# Patient Record
Sex: Female | Born: 1935 | Race: White | Hispanic: No | Smoking: Former smoker
Health system: Southern US, Community
[De-identification: ages and names within clinical notes are randomized; demographics above are authoritative.]

## PROBLEM LIST (undated history)

## (undated) DIAGNOSIS — E785 Hyperlipidemia, unspecified: Secondary | ICD-10-CM

## (undated) DIAGNOSIS — I10 Essential (primary) hypertension: Secondary | ICD-10-CM

## (undated) DIAGNOSIS — Z8619 Personal history of other infectious and parasitic diseases: Secondary | ICD-10-CM

## (undated) DIAGNOSIS — J449 Chronic obstructive pulmonary disease, unspecified: Secondary | ICD-10-CM

## (undated) DIAGNOSIS — IMO0002 Reserved for concepts with insufficient information to code with codable children: Secondary | ICD-10-CM

## (undated) HISTORY — DX: Essential (primary) hypertension: I10

## (undated) HISTORY — DX: Personal history of other infectious and parasitic diseases: Z86.19

## (undated) HISTORY — PX: CHOLECYSTECTOMY: SHX55

## (undated) HISTORY — DX: Reserved for concepts with insufficient information to code with codable children: IMO0002

## (undated) HISTORY — DX: Chronic obstructive pulmonary disease, unspecified: J44.9

## (undated) HISTORY — PX: BREAST BIOPSY: SHX20

## (undated) HISTORY — DX: Hyperlipidemia, unspecified: E78.5

## (undated) HISTORY — PX: APPENDECTOMY: SHX54

---
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
## 1988-08-07 HISTORY — PX: BREAST CYST EXCISION: SHX579

## 1988-08-07 HISTORY — PX: FLEXIBLE SIGMOIDOSCOPY: SHX1649

## 1988-08-07 HISTORY — PX: INCISION AND DRAINAGE BREAST ABSCESS: SUR672

## 1998-08-07 DIAGNOSIS — I10 Essential (primary) hypertension: Secondary | ICD-10-CM | POA: Insufficient documentation

## 1998-08-07 DIAGNOSIS — J449 Chronic obstructive pulmonary disease, unspecified: Secondary | ICD-10-CM | POA: Insufficient documentation

## 2001-08-07 HISTORY — PX: COLONOSCOPY: SHX174

## 2001-08-07 HISTORY — PX: ABDOMINAL HYSTERECTOMY: SHX81

## 2002-02-26 HISTORY — PX: UPPER GI ENDOSCOPY: SHX6162

## 2002-02-26 LAB — HM COLONOSCOPY

## 2003-08-08 HISTORY — PX: BREAST BIOPSY: SHX20

## 2004-05-10 ENCOUNTER — Ambulatory Visit: Payer: Self-pay | Admitting: General Surgery

## 2004-11-09 ENCOUNTER — Ambulatory Visit: Payer: Self-pay | Admitting: General Surgery

## 2005-05-11 ENCOUNTER — Ambulatory Visit: Payer: Self-pay | Admitting: General Surgery

## 2006-05-14 ENCOUNTER — Ambulatory Visit: Payer: Self-pay | Admitting: General Surgery

## 2007-05-15 ENCOUNTER — Ambulatory Visit: Payer: Self-pay | Admitting: General Surgery

## 2007-06-29 DIAGNOSIS — H5440 Blindness, one eye, unspecified eye: Secondary | ICD-10-CM | POA: Insufficient documentation

## 2007-07-06 ENCOUNTER — Emergency Department: Payer: Self-pay | Admitting: Emergency Medicine

## 2007-12-18 DIAGNOSIS — R7303 Prediabetes: Secondary | ICD-10-CM | POA: Insufficient documentation

## 2008-05-19 ENCOUNTER — Ambulatory Visit: Payer: Self-pay | Admitting: General Surgery

## 2009-01-06 ENCOUNTER — Ambulatory Visit: Payer: Self-pay | Admitting: Family Medicine

## 2009-02-01 ENCOUNTER — Ambulatory Visit: Payer: Self-pay | Admitting: Specialist

## 2009-05-24 ENCOUNTER — Ambulatory Visit: Payer: Self-pay | Admitting: General Surgery

## 2009-07-06 DIAGNOSIS — IMO0001 Reserved for inherently not codable concepts without codable children: Secondary | ICD-10-CM | POA: Insufficient documentation

## 2009-11-09 ENCOUNTER — Ambulatory Visit: Payer: Self-pay | Admitting: Specialist

## 2010-04-28 ENCOUNTER — Ambulatory Visit: Payer: Self-pay | Admitting: Specialist

## 2010-05-26 ENCOUNTER — Ambulatory Visit: Payer: Self-pay | Admitting: General Surgery

## 2010-11-03 ENCOUNTER — Ambulatory Visit: Payer: Self-pay | Admitting: Specialist

## 2011-05-29 ENCOUNTER — Ambulatory Visit: Payer: Self-pay | Admitting: General Surgery

## 2011-06-30 ENCOUNTER — Ambulatory Visit: Payer: Self-pay | Admitting: Specialist

## 2012-05-29 ENCOUNTER — Ambulatory Visit: Payer: Self-pay | Admitting: General Surgery

## 2012-12-23 ENCOUNTER — Ambulatory Visit: Payer: Self-pay | Admitting: Family Medicine

## 2013-01-22 ENCOUNTER — Encounter: Payer: Self-pay | Admitting: *Deleted

## 2013-06-10 ENCOUNTER — Ambulatory Visit: Payer: Self-pay | Admitting: General Surgery

## 2013-06-10 ENCOUNTER — Encounter: Payer: Self-pay | Admitting: General Surgery

## 2013-06-26 ENCOUNTER — Ambulatory Visit (INDEPENDENT_AMBULATORY_CARE_PROVIDER_SITE_OTHER): Payer: Medicare Other | Admitting: General Surgery

## 2013-06-26 ENCOUNTER — Encounter: Payer: Self-pay | Admitting: General Surgery

## 2013-06-26 VITALS — BP 154/82 | HR 74 | Resp 16 | Ht 62.0 in | Wt 182.0 lb

## 2013-06-26 DIAGNOSIS — Z1239 Encounter for other screening for malignant neoplasm of breast: Secondary | ICD-10-CM

## 2013-06-26 DIAGNOSIS — N6019 Diffuse cystic mastopathy of unspecified breast: Secondary | ICD-10-CM

## 2013-06-26 NOTE — Progress Notes (Signed)
Patient ID: Toni White, female   DOB: 1936-08-02, 77 y.o.   MRN: [id number]  Chief Complaint  Patient presents with  . Follow-up    mammogram    HPI Toni White is a 77 y.o. female who presents for a breast evaluation. The most recent mammogram was done on 06/10/13. Patient does perform regular self breast checks and gets regular mammograms done.  The patient denies any new problems with the breasts.   HPI  Past Medical History  Diagnosis Date  . Allergy   . Emphysema   . Hypertension   . Osteoporosis   . Personal history of tobacco use, presenting hazards to health   . Asthma   . Ulcer   . Arthritis   . Diffuse cystic mastopathy   . Special screening for malignant neoplasms, colon   . Obesity, unspecified     Past Surgical History  Procedure Laterality Date  . Colonoscopy  2003    Dr. Mechele Collin  . Abdominal hysterectomy    . Flexible sigmoidoscopy  1990  . Cholecystectomy    . Tonsillectomy  1952  . Appendectomy    . Breast biopsy Left 2005    stereo breast biopsy  . Incision and drainage breast abscess Left 1990    Family History  Problem Relation Age of Onset  . Heart disease Father   . Heart disease Mother   . Cancer Brother     lung    Social History History  Substance Use Topics  . Smoking status: Former Smoker -- 20 years  . Smokeless tobacco: Never Used     Comment: quit around 1989  . Alcohol Use: No    Allergies  Allergen Reactions  . Reglan [Metoclopramide] Hives  . Relafen [Nabumetone] Hives  . Sulfa Antibiotics Hives  . Tape Other (See Comments)    Per patient pulls her skin off.  . Hctz [Hydrochlorothiazide] Palpitations    Current Outpatient Prescriptions  Medication Sig Dispense Refill  . aspirin 81 MG tablet Take 81 mg by mouth daily.      . calcium & magnesium carbonates (MYLANTA) 311-232 MG per tablet Take 1 tablet by mouth daily.      . Fluticasone-Salmeterol (ADVAIR) 250-50 MCG/DOSE AEPB Inhale 1 puff into the lungs 2 (two)  times daily.      . metoprolol (LOPRESSOR) 50 MG tablet Take 50 mg by mouth daily.      . Multiple Vitamin (MULTIVITAMIN) capsule Take 1 capsule by mouth daily.      . Multiple Vitamins-Minerals (OCUVITE ADULT 50+ PO) Take by mouth.      . ranitidine (ZANTAC) 150 MG tablet Take 300 mg by mouth at bedtime.      . theophylline (THEODUR) 100 MG 12 hr tablet Take 100 mg by mouth 2 (two) times daily.      Marland Kitchen tiotropium (SPIRIVA) 18 MCG inhalation capsule Place 18 mcg into inhaler and inhale daily.       No current facility-administered medications for this visit.    Review of Systems Review of Systems  Constitutional: Negative.   Respiratory: Negative.   Cardiovascular: Negative.     Blood pressure 154/82, pulse 74, resp. rate 16, height 5\' 2"  (1.575 m), weight 182 lb (82.555 kg).  Physical Exam Physical Exam  Constitutional: She is oriented to person, place, and time. She appears well-developed and well-nourished.  Eyes: Conjunctivae are normal. No scleral icterus.  Neck: Neck supple. No thyromegaly present.  Cardiovascular: Normal rate, regular rhythm and normal  heart sounds.   Pulmonary/Chest: Effort normal and breath sounds normal. Right breast exhibits no inverted nipple, no mass, no nipple discharge, no skin change and no tenderness. Left breast exhibits no inverted nipple, no mass, no nipple discharge, no skin change and no tenderness.  Lymphadenopathy:    She has no cervical adenopathy.    She has no axillary adenopathy.  Neurological: She is alert and oriented to person, place, and time.  Skin: Skin is warm and dry.    Data Reviewed Mammogram reviewed  Assessment    Exam Stable.  History of FCD    Plan    Patient to return in 1 year with a bilateral screening mammogram.        Toni White G 06/27/2013, 5:36 PM

## 2013-06-26 NOTE — Patient Instructions (Addendum)
Patient to continue self breast checks. Patient to contact our office with any new questions or concerns. Patient to return in 1 year.

## 2013-06-27 ENCOUNTER — Encounter: Payer: Self-pay | Admitting: General Surgery

## 2013-08-19 ENCOUNTER — Ambulatory Visit: Payer: Self-pay | Admitting: Family Medicine

## 2013-08-19 ENCOUNTER — Other Ambulatory Visit: Payer: Self-pay | Admitting: Family Medicine

## 2013-08-19 LAB — THEOPHYLLINE LEVEL: Theophylline: 5.6 ug/mL — ABNORMAL LOW (ref 10.0–20.0)

## 2013-08-19 LAB — COMPREHENSIVE METABOLIC PANEL
ANION GAP: 4 — AB (ref 7–16)
Albumin: 3.9 g/dL (ref 3.4–5.0)
Alkaline Phosphatase: 88 U/L
BUN: 12 mg/dL (ref 7–18)
Bilirubin,Total: 0.5 mg/dL (ref 0.2–1.0)
Calcium, Total: 10 mg/dL (ref 8.5–10.1)
Chloride: 101 mmol/L (ref 98–107)
Co2: 30 mmol/L (ref 21–32)
Creatinine: 0.85 mg/dL (ref 0.60–1.30)
EGFR (African American): >60
Glucose: 105 mg/dL — ABNORMAL HIGH (ref 65–99)
Osmolality: 270 (ref 275–301)
POTASSIUM: 3.8 mmol/L (ref 3.5–5.1)
SGOT(AST): 45 U/L — ABNORMAL HIGH (ref 15–37)
SGPT (ALT): 44 U/L (ref 12–78)
Sodium: 135 mmol/L — ABNORMAL LOW (ref 136–145)
TOTAL PROTEIN: 8 g/dL (ref 6.4–8.2)

## 2013-08-19 LAB — TROPONIN I

## 2013-08-19 LAB — URINALYSIS, COMPLETE
BLOOD: NEGATIVE
Bacteria: NONE SEEN
Bilirubin,UR: NEGATIVE
Glucose,UR: NEGATIVE mg/dL (ref 0–75)
Nitrite: NEGATIVE
Ph: 7 (ref 4.5–8.0)
Protein: NEGATIVE
RBC,UR: 1 /HPF (ref 0–5)
SPECIFIC GRAVITY: 1.039 (ref 1.003–1.030)

## 2013-08-19 LAB — CBC WITH DIFFERENTIAL/PLATELET
Basophil #: 0.1 10*3/uL (ref 0.0–0.1)
Basophil %: 1.1 %
Eosinophil #: 0.1 10*3/uL (ref 0.0–0.7)
Eosinophil %: 0.8 %
HCT: 48.4 % — AB (ref 35.0–47.0)
HGB: 16.1 g/dL — AB (ref 12.0–16.0)
Lymphocyte #: 1.3 10*3/uL (ref 1.0–3.6)
Lymphocyte %: 11.9 %
MCH: 28.9 pg (ref 26.0–34.0)
MCHC: 33.2 g/dL (ref 32.0–36.0)
MCV: 87 fL (ref 80–100)
MONO ABS: 1.1 x10 3/mm — AB (ref 0.2–0.9)
Monocyte %: 9.5 %
Neutrophil #: 8.6 10*3/uL — ABNORMAL HIGH (ref 1.4–6.5)
Neutrophil %: 76.7 %
Platelet: 285 10*3/uL (ref 150–440)
RBC: 5.56 10*6/uL — AB (ref 3.80–5.20)
RDW: 13.1 % (ref 11.5–14.5)
WBC: 11.3 10*3/uL — AB (ref 3.6–11.0)

## 2013-08-20 DIAGNOSIS — I725 Aneurysm of other precerebral arteries: Secondary | ICD-10-CM | POA: Insufficient documentation

## 2013-08-20 HISTORY — PX: OTHER SURGICAL HISTORY: SHX169

## 2013-08-21 ENCOUNTER — Other Ambulatory Visit: Payer: Self-pay | Admitting: Family Medicine

## 2013-08-21 LAB — CBC WITH DIFFERENTIAL/PLATELET
BASOS ABS: 0.1 10*3/uL (ref 0.0–0.1)
Basophil %: 0.8 %
Eosinophil #: 0.3 10*3/uL (ref 0.0–0.7)
Eosinophil %: 2.4 %
HCT: 47.3 % — AB (ref 35.0–47.0)
HGB: 16.3 g/dL — ABNORMAL HIGH (ref 12.0–16.0)
LYMPHS ABS: 1 10*3/uL (ref 1.0–3.6)
Lymphocyte %: 8.8 %
MCH: 29.8 pg (ref 26.0–34.0)
MCHC: 34.5 g/dL (ref 32.0–36.0)
MCV: 86 fL (ref 80–100)
MONOS PCT: 9.4 %
Monocyte #: 1.1 x10 3/mm — ABNORMAL HIGH (ref 0.2–0.9)
Neutrophil #: 9.1 10*3/uL — ABNORMAL HIGH (ref 1.4–6.5)
Neutrophil %: 78.6 %
Platelet: 264 10*3/uL (ref 150–440)
RBC: 5.47 10*6/uL — AB (ref 3.80–5.20)
RDW: 13.3 % (ref 11.5–14.5)
WBC: 11.5 10*3/uL — ABNORMAL HIGH (ref 3.6–11.0)

## 2013-08-21 LAB — COMPREHENSIVE METABOLIC PANEL
Albumin: 3.7 g/dL (ref 3.4–5.0)
Alkaline Phosphatase: 86 U/L
Anion Gap: 5 — ABNORMAL LOW (ref 7–16)
BUN: 13 mg/dL (ref 7–18)
Bilirubin,Total: 0.5 mg/dL (ref 0.2–1.0)
CREATININE: 0.74 mg/dL (ref 0.60–1.30)
Calcium, Total: 9.8 mg/dL (ref 8.5–10.1)
Chloride: 101 mmol/L (ref 98–107)
Co2: 30 mmol/L (ref 21–32)
EGFR (African American): >60
EGFR (Non-African Amer.): >60
GLUCOSE: 89 mg/dL (ref 65–99)
Osmolality: 272 (ref 275–301)
Potassium: 4 mmol/L (ref 3.5–5.1)
SGOT(AST): 43 U/L — ABNORMAL HIGH (ref 15–37)
SGPT (ALT): 47 U/L (ref 12–78)
SODIUM: 136 mmol/L (ref 136–145)
Total Protein: 7.6 g/dL (ref 6.4–8.2)

## 2013-08-21 LAB — AMYLASE: Amylase: 31 U/L (ref 25–115)

## 2013-08-21 LAB — MAGNESIUM: Magnesium: 1.6 mg/dL — ABNORMAL LOW

## 2013-08-26 ENCOUNTER — Ambulatory Visit: Payer: Self-pay | Admitting: Family Medicine

## 2013-08-29 ENCOUNTER — Ambulatory Visit: Payer: Self-pay | Admitting: Family Medicine

## 2013-09-02 ENCOUNTER — Ambulatory Visit: Payer: Self-pay | Admitting: Family Medicine

## 2013-10-31 ENCOUNTER — Encounter: Payer: Self-pay | Admitting: Podiatry

## 2013-10-31 ENCOUNTER — Ambulatory Visit (INDEPENDENT_AMBULATORY_CARE_PROVIDER_SITE_OTHER): Payer: Commercial Managed Care - HMO | Admitting: Podiatry

## 2013-10-31 VITALS — BP 130/75 | HR 85 | Resp 16

## 2013-10-31 DIAGNOSIS — M779 Enthesopathy, unspecified: Secondary | ICD-10-CM

## 2013-10-31 DIAGNOSIS — L84 Corns and callosities: Secondary | ICD-10-CM

## 2013-10-31 MED ORDER — TRIAMCINOLONE ACETONIDE 10 MG/ML IJ SUSP
10.0000 mg | Freq: Once | INTRAMUSCULAR | Status: AC
  Administered 2013-10-31: 10 mg

## 2013-10-31 NOTE — Progress Notes (Signed)
Callus on my left foot

## 2013-10-31 NOTE — Progress Notes (Signed)
Subjective:     Patient ID: Toni White, female   DOB: 1935-12-10, 78 y.o.   MRN: [id number]  HPI patient presents stating I'm having a lot of pain on the outside of my right foot that makes wearing shoe gear walking painful. Patient also complains of calluses   Review of Systems     Objective:   Physical Exam Neurovascular status intact with inflammation and pain around fifth metatarsal head right and fluid buildup and keratotic lesion formation    Assessment:     Capsulitis right fifth MPJ with fluid buildup and callus formation of both feet    Plan:     Injected the capsule 3 mg dexamethasone Kenalog and debrided lesions on both feet with no bleeding noted

## 2013-11-13 ENCOUNTER — Encounter: Payer: Self-pay | Admitting: Family Medicine

## 2013-12-05 ENCOUNTER — Encounter: Payer: Self-pay | Admitting: Family Medicine

## 2013-12-05 ENCOUNTER — Ambulatory Visit: Payer: Commercial Managed Care - HMO | Admitting: Podiatry

## 2014-01-26 ENCOUNTER — Inpatient Hospital Stay: Payer: Self-pay | Admitting: Specialist

## 2014-01-26 LAB — URINALYSIS, COMPLETE
BACTERIA: NONE SEEN
Bilirubin,UR: NEGATIVE
Blood: NEGATIVE
GLUCOSE, UR: NEGATIVE mg/dL (ref 0–75)
KETONE: NEGATIVE
LEUKOCYTE ESTERASE: NEGATIVE
Nitrite: NEGATIVE
PROTEIN: NEGATIVE
Ph: 6 (ref 4.5–8.0)
SPECIFIC GRAVITY: 1.017 (ref 1.003–1.030)
WBC UR: 4 /HPF (ref 0–5)

## 2014-01-26 LAB — COMPREHENSIVE METABOLIC PANEL
ALK PHOS: 91 U/L
Albumin: 3.6 g/dL (ref 3.4–5.0)
Anion Gap: 4 — ABNORMAL LOW (ref 7–16)
BILIRUBIN TOTAL: 0.4 mg/dL (ref 0.2–1.0)
BUN: 17 mg/dL (ref 7–18)
CALCIUM: 11.6 mg/dL — AB (ref 8.5–10.1)
CHLORIDE: 99 mmol/L (ref 98–107)
CREATININE: 0.92 mg/dL (ref 0.60–1.30)
Co2: 36 mmol/L — ABNORMAL HIGH (ref 21–32)
GLUCOSE: 122 mg/dL — AB (ref 65–99)
OSMOLALITY: 280 (ref 275–301)
Potassium: 3.8 mmol/L (ref 3.5–5.1)
SGOT(AST): 26 U/L (ref 15–37)
SGPT (ALT): 27 U/L (ref 12–78)
Sodium: 139 mmol/L (ref 136–145)
Total Protein: 8 g/dL (ref 6.4–8.2)

## 2014-01-26 LAB — CBC
HCT: 49 % — ABNORMAL HIGH (ref 35.0–47.0)
HGB: 16.4 g/dL — ABNORMAL HIGH (ref 12.0–16.0)
MCH: 29.3 pg (ref 26.0–34.0)
MCHC: 33.5 g/dL (ref 32.0–36.0)
MCV: 88 fL (ref 80–100)
Platelet: 242 10*3/uL (ref 150–440)
RBC: 5.59 10*6/uL — ABNORMAL HIGH (ref 3.80–5.20)
RDW: 13.1 % (ref 11.5–14.5)
WBC: 11 10*3/uL (ref 3.6–11.0)

## 2014-01-26 LAB — TROPONIN I: Troponin-I: <0.02 ng/mL

## 2014-01-26 LAB — AMMONIA: Ammonia, Plasma: <10 mcmol/L (ref 11–32)

## 2014-01-26 LAB — PRO B NATRIURETIC PEPTIDE: B-Type Natriuretic Peptide: 94 pg/mL (ref 0–450)

## 2014-01-26 LAB — TSH: Thyroid Stimulating Horm: 3.44 u[IU]/mL

## 2014-01-27 LAB — CBC WITH DIFFERENTIAL/PLATELET
BASOS PCT: 0.6 %
Basophil #: 0.1 10*3/uL (ref 0.0–0.1)
EOS PCT: 0 %
Eosinophil #: 0 10*3/uL (ref 0.0–0.7)
HCT: 45.5 % (ref 35.0–47.0)
HGB: 15.2 g/dL (ref 12.0–16.0)
Lymphocyte #: 0.8 10*3/uL — ABNORMAL LOW (ref 1.0–3.6)
Lymphocyte %: 9.1 %
MCH: 29.3 pg (ref 26.0–34.0)
MCHC: 33.3 g/dL (ref 32.0–36.0)
MCV: 88 fL (ref 80–100)
Monocyte #: 0.6 x10 3/mm (ref 0.2–0.9)
Monocyte %: 6.2 %
NEUTROS PCT: 84.1 %
Neutrophil #: 7.8 10*3/uL — ABNORMAL HIGH (ref 1.4–6.5)
PLATELETS: 232 10*3/uL (ref 150–440)
RBC: 5.18 10*6/uL (ref 3.80–5.20)
RDW: 13.6 % (ref 11.5–14.5)
WBC: 9.3 10*3/uL (ref 3.6–11.0)

## 2014-01-27 LAB — BASIC METABOLIC PANEL
ANION GAP: 5 — AB (ref 7–16)
BUN: 21 mg/dL — ABNORMAL HIGH (ref 7–18)
CHLORIDE: 103 mmol/L (ref 98–107)
CO2: 33 mmol/L — AB (ref 21–32)
CREATININE: 0.83 mg/dL (ref 0.60–1.30)
Calcium, Total: 10.5 mg/dL — ABNORMAL HIGH (ref 8.5–10.1)
EGFR (African American): >60
EGFR (Non-African Amer.): >60
GLUCOSE: 139 mg/dL — AB (ref 65–99)
OSMOLALITY: 286 (ref 275–301)
Potassium: 4.3 mmol/L (ref 3.5–5.1)
Sodium: 141 mmol/L (ref 136–145)

## 2014-04-03 ENCOUNTER — Ambulatory Visit: Payer: Self-pay | Admitting: Specialist

## 2014-04-09 ENCOUNTER — Ambulatory Visit: Payer: Self-pay | Admitting: Family Medicine

## 2014-05-19 ENCOUNTER — Ambulatory Visit: Payer: Self-pay | Admitting: Family Medicine

## 2014-05-19 LAB — HM DEXA SCAN

## 2014-06-08 ENCOUNTER — Encounter: Payer: Self-pay | Admitting: Podiatry

## 2014-06-22 DIAGNOSIS — R4182 Altered mental status, unspecified: Secondary | ICD-10-CM | POA: Insufficient documentation

## 2014-06-23 ENCOUNTER — Ambulatory Visit: Payer: Commercial Managed Care - HMO | Admitting: General Surgery

## 2014-06-25 ENCOUNTER — Encounter: Payer: Self-pay | Admitting: General Surgery

## 2014-06-25 ENCOUNTER — Ambulatory Visit: Payer: Self-pay | Admitting: General Surgery

## 2014-07-01 ENCOUNTER — Encounter: Payer: Self-pay | Admitting: General Surgery

## 2014-07-01 ENCOUNTER — Ambulatory Visit (INDEPENDENT_AMBULATORY_CARE_PROVIDER_SITE_OTHER): Payer: Commercial Managed Care - HMO | Admitting: General Surgery

## 2014-07-01 DIAGNOSIS — N6019 Diffuse cystic mastopathy of unspecified breast: Secondary | ICD-10-CM

## 2014-07-01 NOTE — Progress Notes (Signed)
Patient ID: Toni White, female   DOB: 1936/05/22, 78 y.o.   MRN: [id number]  Chief Complaint  Patient presents with  . Follow-up    mammogram    HPI Toni White is a 78 y.o. female. who presents for a breast evaluation. The most recent mammogram was done on 06/12/14. Patient does not perform regular self breast checks but does get regular mammograms done.  No new complaints at this time.    HPI  Past Medical History  Diagnosis Date  . Allergy   . Emphysema   . Hypertension   . Osteoporosis   . Personal history of tobacco use, presenting hazards to health   . Asthma   . Ulcer   . Arthritis   . Diffuse cystic mastopathy   . Special screening for malignant neoplasms, colon   . Obesity, unspecified     Past Surgical History  Procedure Laterality Date  . Colonoscopy  2003    Dr. Vira Agar  . Abdominal hysterectomy    . Flexible sigmoidoscopy  1990  . Cholecystectomy    . Tonsillectomy  1952  . Appendectomy    . Breast biopsy Left 2005    stereo breast biopsy  . Incision and drainage breast abscess Left 1990    Family History  Problem Relation Age of Onset  . Heart disease Father   . Heart disease Mother   . Cancer Brother     lung    Social History History  Substance Use Topics  . Smoking status: Former Smoker -- 20 years  . Smokeless tobacco: Never Used     Comment: quit around 1989  . Alcohol Use: No    Allergies  Allergen Reactions  . Reglan [Metoclopramide] Hives  . Relafen [Nabumetone] Hives  . Sulfa Antibiotics Hives  . Tape Other (See Comments)    Per patient pulls her skin off.  . Hctz [Hydrochlorothiazide] Palpitations    Current Outpatient Prescriptions  Medication Sig Dispense Refill  . amLODipine (NORVASC) 2.5 MG tablet     . aspirin 81 MG tablet Take 81 mg by mouth daily.    . calcium & magnesium carbonates (MYLANTA) 311-232 MG per tablet Take 1 tablet by mouth daily.    . Fluticasone-Salmeterol (ADVAIR) 250-50 MCG/DOSE AEPB Inhale 1  puff into the lungs 2 (two) times daily.    . furosemide (LASIX) 20 MG tablet Take 20 mg by mouth 2 (two) times daily.    . metoprolol succinate (TOPROL-XL) 50 MG 24 hr tablet Take 50 mg by mouth daily.     . Multiple Vitamin (MULTIVITAMIN) capsule Take 1 capsule by mouth daily.    . Multiple Vitamins-Minerals (OCUVITE ADULT 50+ PO) Take by mouth.    . naproxen (NAPROSYN) 500 MG tablet Take 500 mg by mouth 2 (two) times daily with a meal.    . prednisoLONE acetate (PRED FORTE) 1 % ophthalmic suspension Place 1 drop into the right eye 3 (three) times daily.    . ranitidine (ZANTAC) 150 MG tablet Take 300 mg by mouth at bedtime.    . risperiDONE (RISPERDAL) 1 MG tablet     . sertraline (ZOLOFT) 50 MG tablet     . theophylline (THEODUR) 100 MG 12 hr tablet Take 100 mg by mouth 2 (two) times daily.    Marland Kitchen tiotropium (SPIRIVA) 18 MCG inhalation capsule Place 18 mcg into inhaler and inhale daily.     No current facility-administered medications for this visit.    Review of Systems Review  of Systems  Constitutional: Negative.   Respiratory: Negative.   Cardiovascular: Negative.     Blood pressure 120/74, pulse 86, resp. rate 20, height 5\' 2"  (1.575 m), weight 173 lb (78.472 kg).  Physical Exam Physical Exam  Constitutional: She is oriented to person, place, and time. She appears well-developed and well-nourished.  Neck: Neck supple. No thyromegaly present.  Cardiovascular: Normal rate, regular rhythm and normal heart sounds.   No murmur heard. Pulmonary/Chest: Effort normal and breath sounds normal. Right breast exhibits no inverted nipple, no mass, no nipple discharge, no skin change and no tenderness. Left breast exhibits no inverted nipple, no mass, no nipple discharge, no skin change and no tenderness.  Abdominal: Soft. Normal appearance and bowel sounds are normal. There is no hepatosplenomegaly. There is no tenderness. No hernia.  Lymphadenopathy:    She has no cervical adenopathy.     She has no axillary adenopathy.  Neurological: She is alert and oriented to person, place, and time.  Skin: Skin is warm and dry.    Data Reviewed Mammogram reviewed and stable.   Assessment    Stable exam. FCD by multiple prior biopsies    Plan    Patient to return in 1 year bilateral screening mammogram.        Daxon Kyne G 07/01/2014, 9:35 AM

## 2014-07-01 NOTE — Patient Instructions (Signed)
Patient to return in 1 year with a bilateral screening mammogram. Continue self breast exams. Call office for any new breast issues or concerns.

## 2014-08-18 DIAGNOSIS — R5382 Chronic fatigue, unspecified: Secondary | ICD-10-CM | POA: Diagnosis not present

## 2014-08-18 DIAGNOSIS — B0052 Herpesviral keratitis: Secondary | ICD-10-CM | POA: Diagnosis not present

## 2014-08-18 DIAGNOSIS — I671 Cerebral aneurysm, nonruptured: Secondary | ICD-10-CM | POA: Diagnosis not present

## 2014-08-18 DIAGNOSIS — R4182 Altered mental status, unspecified: Secondary | ICD-10-CM | POA: Diagnosis not present

## 2014-08-22 DIAGNOSIS — J449 Chronic obstructive pulmonary disease, unspecified: Secondary | ICD-10-CM | POA: Diagnosis not present

## 2014-08-31 DIAGNOSIS — R0602 Shortness of breath: Secondary | ICD-10-CM | POA: Diagnosis not present

## 2014-08-31 DIAGNOSIS — J449 Chronic obstructive pulmonary disease, unspecified: Secondary | ICD-10-CM | POA: Diagnosis not present

## 2014-09-22 DIAGNOSIS — J449 Chronic obstructive pulmonary disease, unspecified: Secondary | ICD-10-CM | POA: Diagnosis not present

## 2014-10-01 DIAGNOSIS — J449 Chronic obstructive pulmonary disease, unspecified: Secondary | ICD-10-CM | POA: Diagnosis not present

## 2014-10-01 DIAGNOSIS — R0602 Shortness of breath: Secondary | ICD-10-CM | POA: Diagnosis not present

## 2014-10-21 DIAGNOSIS — J449 Chronic obstructive pulmonary disease, unspecified: Secondary | ICD-10-CM | POA: Diagnosis not present

## 2014-10-22 DIAGNOSIS — B0052 Herpesviral keratitis: Secondary | ICD-10-CM | POA: Diagnosis not present

## 2014-10-30 DIAGNOSIS — J449 Chronic obstructive pulmonary disease, unspecified: Secondary | ICD-10-CM | POA: Diagnosis not present

## 2014-10-30 DIAGNOSIS — R0602 Shortness of breath: Secondary | ICD-10-CM | POA: Diagnosis not present

## 2014-11-09 DIAGNOSIS — R5382 Chronic fatigue, unspecified: Secondary | ICD-10-CM | POA: Diagnosis not present

## 2014-11-09 DIAGNOSIS — J449 Chronic obstructive pulmonary disease, unspecified: Secondary | ICD-10-CM | POA: Diagnosis not present

## 2014-11-09 DIAGNOSIS — I671 Cerebral aneurysm, nonruptured: Secondary | ICD-10-CM | POA: Diagnosis not present

## 2014-11-09 DIAGNOSIS — E782 Mixed hyperlipidemia: Secondary | ICD-10-CM | POA: Diagnosis not present

## 2014-11-10 DIAGNOSIS — R0902 Hypoxemia: Secondary | ICD-10-CM | POA: Diagnosis not present

## 2014-11-10 DIAGNOSIS — R0609 Other forms of dyspnea: Secondary | ICD-10-CM | POA: Diagnosis not present

## 2014-11-10 DIAGNOSIS — J432 Centrilobular emphysema: Secondary | ICD-10-CM | POA: Diagnosis not present

## 2014-11-21 DIAGNOSIS — J449 Chronic obstructive pulmonary disease, unspecified: Secondary | ICD-10-CM | POA: Diagnosis not present

## 2014-11-28 NOTE — Discharge Summary (Signed)
PATIENT NAME:  Toni White, RAUSCH MR#:  [id number] DATE OF BIRTH:  Jun 14, 1936  DATE OF ADMISSION:  01/26/2014 DATE OF DISCHARGE:  01/29/2014  For a detailed note, please take a look at the history and physical done on admission by Dr. Tressia Miners.   DIAGNOSES AT DISCHARGE: As follows:  1. Chronic obstructive pulmonary disease exacerbation.  2. Metabolic encephalopathy secondary to hypoxia and hypercarbia from chronic obstructive pulmonary disease.  3. Hypertension.  4. Hyperlipidemia.  5. Depression.   DIET: The patient is being discharged on a low-sodium, low-fat diet.   ACTIVITY: As tolerated.   FOLLOWUP:  Dr. Lelon Huh in next 1-2 weeks. Patient is being discharged home with home health physical therapy and nursing services.   DISCHARGE MEDICATIONS: Advair 250/50 one puff b.i.d., Spiriva 1 puff daily, multivitamin daily, calcium carbonate 2 tabs daily, Ocuvite 2 tablets daily, aspirin 81 mg daily, theophylline 100 mg b.i.d., Toprol 50 mg daily, ranitidine 300 mg at bedtime, albuterol inhaler 2 puffs q. 4-6 hours as needed, Lasix 20 mg b.i.d. as needed, Zoloft 100 mg daily, amlodipine 5 mg daily, Naprosyn 5 mg b.i.d. as needed, Risperdal 1 mg daily, Pravachol 40 mg at bedtime, prednisone taper starting at 60 mg down to 10 mg over the next 6 days.   [place] COURSE: None.   PERTINENT STUDIES DONE DURING THE HOSPITAL COURSE: CT scan of the head done without contrast on admission showing no acute intracranial process.  A chest x-ray done on admission showing no acute cardiopulmonary disease.   BRIEF HOSPITAL COURSE: This is a 79 year old female with medical problems as mentioned above, presented to the hospital due to shortness of breath, wheezing and cough, and noted to be somnolent.  1. Chronic obstructive pulmonary disease exacerbation. This is likely the cause of the patient's shortness of breath, weakness, and lethargy. The patient was treated with IV steroids,  around-the-clock nebulizer treatments. Maintained on Advair, Spiriva, and theophylline. The patient's chest x-ray did not show any evidence of acute pneumonia or bronchitis; therefore, she was not started on antibiotics. She was also maintained on BiPAP at nights and given O2 supplementation. Over the past 48 to 72 hours, the patient's clinical symptoms have significantly improved. She is less bronchospastic; therefore, she is being discharged on an oral prednisone taper along with the maintenance of her inhalers. At this point, she was only using nocturnal oxygen, but she is being advised to use oxygen continuously throughout the day, too.  2. Hypertension. The patient remained hemodynamically stable. She was maintained on her metoprolol and Norvasc. She will continue that.  3. Weakness and sleepiness. The patient apparently had been sleeping quite a bit during the days at home. There was some concern for possible underlying sleep apnea; therefore, the patient was maintained on BiPAP at nights. I think she needs an official sleep study as outpatient. A lot of this probably is secondary to her COPD with hypoxia and hypercarbia, which has improved. The patient was also seen by physical therapy and they thought she would benefit from home health services; therefore, home health PT and nursing was arranged for her.  4. Hypercalcemia. This was likely secondary to dehydration improved with IV fluids.  5. Hyperlipidemia. The patient was maintained on her Pravachol. She will resume that.  6. Depression. The patient was maintained on her Zoloft and she will also resume that upon discharge.   CODE STATUS: The patient is a full code.   TIME SPENT ON DISCHARGE: 40 minutes.  ____________________________ Belia Heman. Verdell Carmine, MD vjs:dd D: 01/29/2014 15:26:12 ET T: 01/29/2014 19:36:24 ET JOB#: [id number]  cc: Belia Heman. Verdell Carmine, MD, <Dictator> Kirstie Peri. Caryn Section, MD Henreitta Leber MD ELECTRONICALLY SIGNED 02/03/2014  20:41

## 2014-11-28 NOTE — H&P (Signed)
PATIENT NAME:  Toni White, Toni White MR#:  [id number] DATE OF BIRTH:  May 21, 1936  DATE OF ADMISSION:  01/26/2014  ADMITTING PHYSICIAN: Gladstone Lighter, MD   PRIMARY CARE PHYSICIAN: Kirstie Peri. Caryn Section, MD   CHIEF COMPLAINT: Sleeping and difficulty breathing.   HISTORY OF PRESENT ILLNESS: Ms. Petersen is a 79 year old Caucasian female with past medical history significant for hypertension, hyperlipidemia, depression and chronic obstructive pulmonary disease, only on nocturnal home oxygen, was brought in today by family secondary to worsening weakness and also shortness of breath for almost 4 days now. The patient's daughter explained that the patient lives at home with a roommate, usually very active at baseline, able to care for herself, however, for the past few months that she has been very sleepy, sleeping almost up to 16 to 20 hours a day recently.  This sleeping and generalized weakness has been worsening in the last month. For the last 4 days, the patient has had audible wheezing,  worsening dyspnea, more sleeping, occasional dry cough so was brought into the hospital today.  ABG shows pH of 7.35, pCO2 of 66 at this time. The patient is alert, arousable and is oriented. She has significant wheezing on exam and is being admitted for chronic obstructive pulmonary disease exacerbation. The patient also was hypoxic, 87% on room air when she arrived, and placed on 2 liters nasal cannula with sats about 90% to 91% at this time. She is on Seroquel and Risperdal at home according to daughter. These have been started several years ago, no recent dose changes.   PAST MEDICAL HISTORY: 1.  Hypertension.  2.  Hyperlipidemia.  3.  History of transient ischemic attacks.  4.  Borderline diabetes mellitus.  6.  Chronic obstructive pulmonary disease with emphysema, on nocturnal 1 liter home oxygen.  7.  Depression.   PAST SURGICAL HISTORY: 1.  Tonsillectomy.  2.  Cholecystectomy.  3.  Hysterectomy.   ALLERGIES:  RELAFEN, SUFLA, TAPE.   CURRENT HOME MEDICATIONS:  1.  Aspirin 81 mg p.o. daily.  2.  Advair 250/50 one puff b.i.d.  3.  Norvasc 5 mg p.o. daily.  4.  Calcium carbonate 2 tablets p.o. daily.  5.  Lasix 20 mg p.o. b.i.d. p.r.n.  6.  Toprol 50 mg p.o. daily.  7.  Multivitamin 1 tablet daily.  8.  Naproxen 500 mg p.o. b.i.d.  9.  Ocuvite multivitamin and minerals capsule 2 capsules daily.  10.  Pravachol 40 mg p.o. daily.  11.  Ranitidine 300 mg p.o. at bedtime.  12.  Risperdal 1 mg p.o. daily.  13.  Sertraline 100 mg p.o. daily.  14.  Spiriva 18 mcg inhalation capsule daily.  15.  Theophylline 100 mg p.o. b.i.d.  16.  Ventolin inhaler 2 puffs q.4 to 6 hours p.r.n.   SOCIAL HISTORY: The patient lives at home with a roommate. Quit smoking more than 25 years ago. No alcohol use.   FAMILY HISTORY: Significant for hypertension in the family.    REVIEW OF SYSTEMS:    CONSTITUTIONAL: Positive for fatigue and weakness. No fevers.  EYES: Positive for visual loss in the right eye several years ago and also had recent herpes virus on the same side. No glaucoma or cataracts.  ENT: No tinnitus, ear pain, hearing loss, epistaxis or discharge.  RESPIRATORY: Positive for cough and wheezing and history of chronic obstructive pulmonary disease. Positive for dyspnea. No hemoptysis.  CARDIOVASCULAR: No chest pain, orthopnea, edema, palpitations or syncope. Had near syncope yesterday.  GASTROINTESTINAL: No  nausea, vomiting, abdominal pain, diarrhea, hematemesis or melena.  GENITOURINARY: No dysuria, hematuria, renal calculus, frequency or incontinence.  ENDOCRINE: No polyuria, nocturia, thyroid problems, heat or cold intolerance.  HEMATOLOGY: No anemia, easy bruising or bleeding. SKIN: no acne, rash or lesions.  MUSCULOSKELETAL: Positive for osteoarthritis. No gout.  NEUROLOGICAL:  No numbness, weakness, cerebrovascular accident or seizures. Positive history of transient ischemic attacks with no  residual neurological deficits.  PSYCHOLOGIC: No anxiety, insomnia or active depression at this time.   PHYSICAL EXAMINATION: VITAL SIGNS: Temperature 98 degrees Fahrenheit, pulse 96, respirations 20, blood pressure 164/84, pulse oximetry 87% on room air.  GENERAL: Well-built, well-nourished female lying in bed, not in any acute distress.  HEENT: Normocephalic, atraumatic. Left eye pupil is normal size, reacting to light, normal extraocular movements. Right eye is smaller in size with erythematous conjunctiva and tearing.  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  Oropharynx clear without erythema, mass or exudates.  NECK: Supple. No thyromegaly, JVD or carotid bruits. No lymphadenopathy.  LUNGS: Moving air bilaterally. Diffuse tight expiratory wheeze throughout the lung fields. No crackles or rhonchi heard. No use of accessory muscles for breathing.  CARDIOVASCULAR: S1, S2, regular rate and rhythm. No murmurs, rubs or gallops.  ABDOMEN: Soft, nontender, nondistended. No hepatosplenomegaly. Normal bowel sounds.  EXTREMITIES: No pedal edema. No clubbing or cyanosis. Feet are cold to touch at this time.  No purplish discoloration, feeble dorsalis pedis pulses are palpable.  SKIN: No acne, rash or lesions.  LYMPHATICS: No cervical lymphadenopathy.  NEUROLOGIC: Other than the right, cranial nerves appear to be intact.  No focal motor or sensory deficits.  PSYCHOLOGICAL: The patient alert, oriented x 3 at the time of my exam.   LABORATORY, DIAGNOSTIC AND RADIOLOGICAL DATA:    1.  WBC 11.0, hemoglobin 16.4, hematocrit 49.0, platelet count 242.  2.  Sodium 139, potassium 3.8, chloride 99, bicarbonate 36, BUN 17, creatinine 0.92, glucose of 122 and calcium of 11.6.  3.  ALT 27, AST 26, alkaline phosphatase 91, total bilirubin 0.4, albumin of 3.6.  4.  BNP is 94. Troponin is negative.  5.  Urinalysis negative for infection.  6.  Chest x-ray clear lung fields.  7.  ABG showing pH of 7.35, pCO2  66, CO2 96, bicarbonate 36.4 and oxygen saturation of 97% on 4 liters nasal cannula.  8.  CT of the head without contact showing atrophy, small vessel ischemic changes, no acute intracranial abnormality.  9.  EKG showing normal sinus rhythm, right bundle-branch block, heart rate of 90.   ASSESSMENT AND PLAN: A 79 year old female with history of chronic obstructive pulmonary disease, hypertension, history of transient ischemic attacks, brought in by family due to wheezing, cough and sleeping several hours a day. 1.  Acute on chronic obstructive pulmonary disease exacerbation, hypoxic to room air, now requiring 2 liters continuous O2. Solu-Medrol IV, nebulizers around-the-clock, inhalers and monitor.  No evidence of pneumonia or bronchitis so hold off on antibiotics at this time. ABG showing minimal acidosis, will try BiPAP at night here while in the hospital. Hopefully, that should help with her sleeping and weakness.  2.  Hypertension. Continue home medications.  3.  History of transient ischemic attack, stable on aspirin and statin.  4.  Hypercalcemia.  Could be dehydration and hematocrit also elevated. Do gentle fluids. Check a parathyroid hormone and TSH levels at this time.  5.  Increased weakness and sleepiness. Hold any sedating medications. Hold her Seroquel and Risperdal. Physical therapy consult.  6.  Deep vein thrombosis prophylaxis with Lovenox.  7.  CODE STATUS: Full code.   TIME SPENT ON ADMISSION: 50 minutes.   ____________________________ Gladstone Lighter, MD rk:cs D: 01/26/2014 14:39:00 ET T: 01/26/2014 15:35:45 ET JOB#: [id number]  cc: Gladstone Lighter, MD, <Dictator> Kirstie Peri. Caryn Section, MD Gladstone Lighter MD ELECTRONICALLY SIGNED 01/26/2014 16:42

## 2014-11-30 DIAGNOSIS — J449 Chronic obstructive pulmonary disease, unspecified: Secondary | ICD-10-CM | POA: Diagnosis not present

## 2014-11-30 DIAGNOSIS — R0602 Shortness of breath: Secondary | ICD-10-CM | POA: Diagnosis not present

## 2014-12-09 DIAGNOSIS — F3341 Major depressive disorder, recurrent, in partial remission: Secondary | ICD-10-CM | POA: Diagnosis not present

## 2014-12-09 DIAGNOSIS — J449 Chronic obstructive pulmonary disease, unspecified: Secondary | ICD-10-CM | POA: Diagnosis not present

## 2014-12-09 DIAGNOSIS — I10 Essential (primary) hypertension: Secondary | ICD-10-CM | POA: Diagnosis not present

## 2014-12-09 DIAGNOSIS — E119 Type 2 diabetes mellitus without complications: Secondary | ICD-10-CM | POA: Diagnosis not present

## 2014-12-09 DIAGNOSIS — F331 Major depressive disorder, recurrent, moderate: Secondary | ICD-10-CM | POA: Diagnosis not present

## 2014-12-09 DIAGNOSIS — E782 Mixed hyperlipidemia: Secondary | ICD-10-CM | POA: Diagnosis not present

## 2014-12-09 LAB — BASIC METABOLIC PANEL
BUN: 17 mg/dL (ref 4–21)
Creatinine: 0.9 mg/dL (ref 0.5–1.1)
Glucose: 99 mg/dL
Potassium: 4.4 mmol/L (ref 3.4–5.3)
SODIUM: 144 mmol/L (ref 137–147)

## 2014-12-09 LAB — HEMOGLOBIN A1C: HEMOGLOBIN A1C: 5.9 % (ref 4.0–6.0)

## 2014-12-09 LAB — LIPID PANEL
CHOLESTEROL: 148 mg/dL (ref 0–200)
HDL: 42 mg/dL (ref 35–70)
LDL CALC: 69 mg/dL
TRIGLYCERIDES: 184 mg/dL — AB (ref 40–160)

## 2014-12-21 DIAGNOSIS — J449 Chronic obstructive pulmonary disease, unspecified: Secondary | ICD-10-CM | POA: Diagnosis not present

## 2014-12-30 DIAGNOSIS — R0602 Shortness of breath: Secondary | ICD-10-CM | POA: Diagnosis not present

## 2014-12-30 DIAGNOSIS — J449 Chronic obstructive pulmonary disease, unspecified: Secondary | ICD-10-CM | POA: Diagnosis not present

## 2015-01-21 DIAGNOSIS — J449 Chronic obstructive pulmonary disease, unspecified: Secondary | ICD-10-CM | POA: Diagnosis not present

## 2015-01-30 DIAGNOSIS — R0602 Shortness of breath: Secondary | ICD-10-CM | POA: Diagnosis not present

## 2015-01-30 DIAGNOSIS — J449 Chronic obstructive pulmonary disease, unspecified: Secondary | ICD-10-CM | POA: Diagnosis not present

## 2015-02-09 DIAGNOSIS — Z85828 Personal history of other malignant neoplasm of skin: Secondary | ICD-10-CM | POA: Diagnosis not present

## 2015-02-09 DIAGNOSIS — L988 Other specified disorders of the skin and subcutaneous tissue: Secondary | ICD-10-CM | POA: Diagnosis not present

## 2015-02-09 DIAGNOSIS — L821 Other seborrheic keratosis: Secondary | ICD-10-CM | POA: Diagnosis not present

## 2015-02-18 DIAGNOSIS — G4711 Idiopathic hypersomnia with long sleep time: Secondary | ICD-10-CM | POA: Diagnosis not present

## 2015-02-18 DIAGNOSIS — R4182 Altered mental status, unspecified: Secondary | ICD-10-CM | POA: Diagnosis not present

## 2015-02-18 DIAGNOSIS — R5382 Chronic fatigue, unspecified: Secondary | ICD-10-CM | POA: Diagnosis not present

## 2015-02-18 DIAGNOSIS — I671 Cerebral aneurysm, nonruptured: Secondary | ICD-10-CM | POA: Diagnosis not present

## 2015-02-20 DIAGNOSIS — J449 Chronic obstructive pulmonary disease, unspecified: Secondary | ICD-10-CM | POA: Diagnosis not present

## 2015-02-23 DIAGNOSIS — E119 Type 2 diabetes mellitus without complications: Secondary | ICD-10-CM | POA: Diagnosis not present

## 2015-02-23 DIAGNOSIS — Z9981 Dependence on supplemental oxygen: Secondary | ICD-10-CM | POA: Diagnosis not present

## 2015-02-23 DIAGNOSIS — R5382 Chronic fatigue, unspecified: Secondary | ICD-10-CM | POA: Diagnosis not present

## 2015-02-23 DIAGNOSIS — I10 Essential (primary) hypertension: Secondary | ICD-10-CM | POA: Diagnosis not present

## 2015-02-23 DIAGNOSIS — Z9181 History of falling: Secondary | ICD-10-CM | POA: Diagnosis not present

## 2015-02-23 DIAGNOSIS — M810 Age-related osteoporosis without current pathological fracture: Secondary | ICD-10-CM | POA: Diagnosis not present

## 2015-02-23 DIAGNOSIS — J449 Chronic obstructive pulmonary disease, unspecified: Secondary | ICD-10-CM | POA: Diagnosis not present

## 2015-02-23 DIAGNOSIS — M791 Myalgia: Secondary | ICD-10-CM | POA: Diagnosis not present

## 2015-02-23 DIAGNOSIS — Z87891 Personal history of nicotine dependence: Secondary | ICD-10-CM | POA: Diagnosis not present

## 2015-02-24 DIAGNOSIS — H40032 Anatomical narrow angle, left eye: Secondary | ICD-10-CM | POA: Diagnosis not present

## 2015-02-25 ENCOUNTER — Ambulatory Visit: Payer: Commercial Managed Care - HMO | Attending: Specialist

## 2015-02-25 ENCOUNTER — Other Ambulatory Visit: Payer: Self-pay | Admitting: Specialist

## 2015-02-25 DIAGNOSIS — G4710 Hypersomnia, unspecified: Secondary | ICD-10-CM | POA: Diagnosis not present

## 2015-02-25 DIAGNOSIS — R0609 Other forms of dyspnea: Secondary | ICD-10-CM | POA: Diagnosis not present

## 2015-02-25 DIAGNOSIS — R0902 Hypoxemia: Secondary | ICD-10-CM | POA: Diagnosis not present

## 2015-02-25 DIAGNOSIS — J449 Chronic obstructive pulmonary disease, unspecified: Secondary | ICD-10-CM | POA: Diagnosis not present

## 2015-02-25 LAB — BLOOD GAS, ARTERIAL
Acid-Base Excess: 9.9 mmol/L — ABNORMAL HIGH (ref 0.0–3.0)
Allens test (pass/fail): POSITIVE — AB
Bicarbonate: 36 mEq/L — ABNORMAL HIGH (ref 21.0–28.0)
FIO2: 0.28 %
O2 Saturation: 96.7 %
Patient temperature: 37
pCO2 arterial: 53 mmHg — ABNORMAL HIGH (ref 32.0–48.0)
pH, Arterial: 7.44 (ref 7.350–7.450)
pO2, Arterial: 84 mmHg (ref 83.0–108.0)

## 2015-02-26 ENCOUNTER — Encounter: Payer: Self-pay | Admitting: Family Medicine

## 2015-03-01 DIAGNOSIS — J449 Chronic obstructive pulmonary disease, unspecified: Secondary | ICD-10-CM | POA: Diagnosis not present

## 2015-03-01 DIAGNOSIS — R0602 Shortness of breath: Secondary | ICD-10-CM | POA: Diagnosis not present

## 2015-03-05 ENCOUNTER — Telehealth: Payer: Self-pay | Admitting: *Deleted

## 2015-03-05 NOTE — Telephone Encounter (Signed)
[facility]. Patient will have PT for strength, use of walker and wheelchair.

## 2015-03-08 DIAGNOSIS — J449 Chronic obstructive pulmonary disease, unspecified: Secondary | ICD-10-CM | POA: Diagnosis not present

## 2015-03-08 DIAGNOSIS — E139 Other specified diabetes mellitus without complications: Secondary | ICD-10-CM | POA: Diagnosis not present

## 2015-03-08 DIAGNOSIS — I729 Aneurysm of unspecified site: Secondary | ICD-10-CM | POA: Diagnosis not present

## 2015-03-08 DIAGNOSIS — R0902 Hypoxemia: Secondary | ICD-10-CM | POA: Diagnosis not present

## 2015-03-17 DIAGNOSIS — R5382 Chronic fatigue, unspecified: Secondary | ICD-10-CM | POA: Diagnosis not present

## 2015-03-17 DIAGNOSIS — I671 Cerebral aneurysm, nonruptured: Secondary | ICD-10-CM | POA: Diagnosis not present

## 2015-03-17 DIAGNOSIS — R251 Tremor, unspecified: Secondary | ICD-10-CM | POA: Diagnosis not present

## 2015-03-17 DIAGNOSIS — G4711 Idiopathic hypersomnia with long sleep time: Secondary | ICD-10-CM | POA: Diagnosis not present

## 2015-03-17 DIAGNOSIS — R4182 Altered mental status, unspecified: Secondary | ICD-10-CM | POA: Diagnosis not present

## 2015-03-30 DIAGNOSIS — H40003 Preglaucoma, unspecified, bilateral: Secondary | ICD-10-CM | POA: Diagnosis not present

## 2015-04-22 ENCOUNTER — Other Ambulatory Visit: Payer: Self-pay | Admitting: Family Medicine

## 2015-04-29 ENCOUNTER — Other Ambulatory Visit: Payer: Self-pay | Admitting: Family Medicine

## 2015-04-29 ENCOUNTER — Telehealth: Payer: Self-pay

## 2015-04-29 NOTE — Telephone Encounter (Signed)
Lattie Haw from [facility] mail order pharmacy called stating the patient contacted them requesting a refill on Sertraline 100mg . The orginial rx for Sertraline was prescribed by Dr. Caryn Section 06/2014. Lattie Haw states that patient has not had this medication sent out to her in several months (last filled 09/2014). Patient has been getting Citalopram 20mg  sent out to her that was prescribed by Dr. Melrose Nakayama. Lattie Haw needs to verify if patient is supposed to be on both medications. Please advised. Call back number is [phone] ref # [id number].

## 2015-04-29 NOTE — Telephone Encounter (Signed)
Called and spoke with patient and advised her as below. Patient states she has been taking the Citalopram that is prescribed by Dr. Melrose Nakayama. Patient states Dr. Melrose Nakayama took her off the Sertraline several months ago and changed her to Citalopram. I advised her that [place] called [place] because she requested a refill on the Sertraline. Patient states she accidentally put a request in by mistake. Patient is aware that she should not be taking both medications. I called [place] and advised them that patient is only to be on Citalopram and not Sertraline.

## 2015-04-29 NOTE — Telephone Encounter (Signed)
Should only be on one or the other. Recommend she continue citalopram for the time being.

## 2015-04-30 ENCOUNTER — Other Ambulatory Visit: Payer: Self-pay

## 2015-04-30 DIAGNOSIS — Z1231 Encounter for screening mammogram for malignant neoplasm of breast: Secondary | ICD-10-CM

## 2015-05-11 DIAGNOSIS — Z79899 Other long term (current) drug therapy: Secondary | ICD-10-CM | POA: Diagnosis not present

## 2015-05-11 DIAGNOSIS — E782 Mixed hyperlipidemia: Secondary | ICD-10-CM | POA: Diagnosis not present

## 2015-05-11 DIAGNOSIS — I671 Cerebral aneurysm, nonruptured: Secondary | ICD-10-CM | POA: Diagnosis not present

## 2015-05-11 DIAGNOSIS — J449 Chronic obstructive pulmonary disease, unspecified: Secondary | ICD-10-CM | POA: Diagnosis not present

## 2015-05-15 ENCOUNTER — Other Ambulatory Visit: Payer: Self-pay | Admitting: Family Medicine

## 2015-06-01 DIAGNOSIS — R5382 Chronic fatigue, unspecified: Secondary | ICD-10-CM | POA: Diagnosis not present

## 2015-06-01 DIAGNOSIS — Z23 Encounter for immunization: Secondary | ICD-10-CM | POA: Diagnosis not present

## 2015-06-01 DIAGNOSIS — J432 Centrilobular emphysema: Secondary | ICD-10-CM | POA: Diagnosis not present

## 2015-06-01 DIAGNOSIS — R0902 Hypoxemia: Secondary | ICD-10-CM | POA: Diagnosis not present

## 2015-06-01 DIAGNOSIS — R0609 Other forms of dyspnea: Secondary | ICD-10-CM | POA: Diagnosis not present

## 2015-06-05 ENCOUNTER — Other Ambulatory Visit: Payer: Self-pay | Admitting: Family Medicine

## 2015-06-08 DIAGNOSIS — R600 Localized edema: Secondary | ICD-10-CM | POA: Insufficient documentation

## 2015-06-08 DIAGNOSIS — M810 Age-related osteoporosis without current pathological fracture: Secondary | ICD-10-CM | POA: Insufficient documentation

## 2015-06-08 DIAGNOSIS — G4710 Hypersomnia, unspecified: Secondary | ICD-10-CM | POA: Insufficient documentation

## 2015-06-08 DIAGNOSIS — M545 Low back pain, unspecified: Secondary | ICD-10-CM | POA: Insufficient documentation

## 2015-06-08 DIAGNOSIS — R000 Tachycardia, unspecified: Secondary | ICD-10-CM | POA: Insufficient documentation

## 2015-06-08 DIAGNOSIS — H3530 Unspecified macular degeneration: Secondary | ICD-10-CM | POA: Insufficient documentation

## 2015-06-08 DIAGNOSIS — F3341 Major depressive disorder, recurrent, in partial remission: Secondary | ICD-10-CM | POA: Insufficient documentation

## 2015-06-08 DIAGNOSIS — R0989 Other specified symptoms and signs involving the circulatory and respiratory systems: Secondary | ICD-10-CM | POA: Insufficient documentation

## 2015-06-08 DIAGNOSIS — M5430 Sciatica, unspecified side: Secondary | ICD-10-CM | POA: Insufficient documentation

## 2015-06-09 ENCOUNTER — Encounter: Payer: Self-pay | Admitting: Family Medicine

## 2015-06-09 ENCOUNTER — Ambulatory Visit (INDEPENDENT_AMBULATORY_CARE_PROVIDER_SITE_OTHER): Payer: Commercial Managed Care - HMO | Admitting: Family Medicine

## 2015-06-09 VITALS — BP 120/68 | HR 72 | Temp 98.5°F | Resp 16 | Wt 162.0 lb

## 2015-06-09 DIAGNOSIS — R600 Localized edema: Secondary | ICD-10-CM

## 2015-06-09 DIAGNOSIS — R609 Edema, unspecified: Secondary | ICD-10-CM | POA: Diagnosis not present

## 2015-06-09 DIAGNOSIS — R251 Tremor, unspecified: Secondary | ICD-10-CM

## 2015-06-09 DIAGNOSIS — R0989 Other specified symptoms and signs involving the circulatory and respiratory systems: Secondary | ICD-10-CM

## 2015-06-09 DIAGNOSIS — F3341 Major depressive disorder, recurrent, in partial remission: Secondary | ICD-10-CM | POA: Diagnosis not present

## 2015-06-09 DIAGNOSIS — E785 Hyperlipidemia, unspecified: Secondary | ICD-10-CM

## 2015-06-09 DIAGNOSIS — E119 Type 2 diabetes mellitus without complications: Secondary | ICD-10-CM | POA: Diagnosis not present

## 2015-06-09 DIAGNOSIS — G4710 Hypersomnia, unspecified: Secondary | ICD-10-CM | POA: Diagnosis not present

## 2015-06-09 DIAGNOSIS — I10 Essential (primary) hypertension: Secondary | ICD-10-CM

## 2015-06-09 LAB — POCT GLYCOSYLATED HEMOGLOBIN (HGB A1C)
Est. average glucose Bld gHb Est-mCnc: 111
Hemoglobin A1C: 5.5

## 2015-06-09 NOTE — Progress Notes (Signed)
Patient: Toni White Female    DOB: 1935/09/26   79 y.o.   MRN: [id number] Visit Date: 06/09/2015  Today's Provider: Lelon Huh, MD   Chief Complaint  Patient presents with  . Hypertension    follow up  . Diabetes    follow up  . Hyperlipidemia    follow up   Subjective:    HPI   Diabetes Mellitus Type II, Follow-up:   Lab Results  Component Value Date   HGBA1C 5.9 12/09/2014   Last seen for diabetes 6 months ago.  Management since then includes no changes. She reports good compliance with treatment. She is not having side effects.  Current symptoms include none and have been stable. Home blood sugar records: not being checked  Episodes of hypoglycemia? no   Current Insulin Regimen: none Most Recent Eye Exam: <1 year Weight trend: stable Prior visit with dietician: no Current diet: in general, an "unhealthy" diet Current exercise: none  ------------------------------------------------------------------------   Hypertension, follow-up:  BP Readings from Last 3 Encounters:  06/09/15 120/68  07/01/14 120/74  10/31/13 130/75    She was last seen for hypertension 6 months ago.  BP at that visit was 114/70. Management since that visit includes no changes.She reports good compliance with treatment. She is not having side effects.  She is not exercising. She is not adherent to low salt diet.   Outside blood pressures are being checked once a week by Hospice nurse. She is experiencing occasional swelling in legs  Patient denies chest pain, chest pressure/discomfort, claudication, dyspnea, exertional chest pressure/discomfort, fatigue and irregular heart beat.   Cardiovascular risk factors include advanced age (older than 3 for men, 24 for women), diabetes mellitus, dyslipidemia, hypertension and sedentary lifestyle.  Use of agents associated with hypertension: NSAIDS.    ------------------------------------------------------------------------    Lipid/Cholesterol, Follow-up:   Last seen for this 6 months ago.  Management since that visit includes no changes.  Last Lipid Panel:    Component Value Date/Time   CHOL 148 12/09/2014   TRIG 184* 12/09/2014   HDL 42 12/09/2014   LDLCALC 69 12/09/2014    She reports good compliance with treatment. She is not having side effects.   Wt Readings from Last 3 Encounters:  06/09/15 162 lb (73.483 kg)  07/01/14 173 lb (78.472 kg)  06/26/13 182 lb (82.555 kg)    ------------------------------------------------------------------------  Follow up Depression Continues on sertraline which has been well tolerated. She was nearly catatonic prior to restarting this medication. She and her SO feel her mood has been pretty good the last several months.   She continues routine follow up with Dr. Melrose Nakayama for hypersomnia which is better on Nuvigil, and tremor which is better on gabapentin.   She continues regular follow up with Dr. [name] and feels her breathing has been pretty stable on current inhalers. She continues to use 2lpm supplemental oxygen.     Allergies  Allergen Reactions  . Flexeril  [Cyclobenzaprine Hcl]     Confusion  . Reglan [Metoclopramide] Hives  . Relafen [Nabumetone] Hives  . Risperdal  [Risperidone]     Confusion Rash  . Sulfa Antibiotics Hives  . Tape Other (See Comments)    Per patient pulls her skin off.  . Triamterene-Hctz     Hypercalcemia  . Hctz [Hydrochlorothiazide] Palpitations   Previous Medications   ALBUTEROL (VENTOLIN HFA) 108 (90 BASE) MCG/ACT INHALER    Inhale 2 puffs into the lungs. Every 4-6 hours as  needed   AMLODIPINE (NORVASC) 2.5 MG TABLET    TAKE 1 TABLET EVERY DAY   ARMODAFINIL (NUVIGIL) 200 MG TABS    Take 1 tablet by mouth daily.   ASPIRIN 81 MG TABLET    Take 81 mg by mouth daily.   CALCIUM-MAGNESIUM-VITAMIN D (CALCIUM 500 PO)    Take 1 tablet by mouth  2 (two) times daily.   FLUTICASONE-SALMETEROL (ADVAIR) 250-50 MCG/DOSE AEPB    Inhale 1 puff into the lungs 2 (two) times daily.   FUROSEMIDE (LASIX) 20 MG TABLET    Take 20 mg by mouth 2 (two) times daily as needed for edema.    METOPROLOL SUCCINATE (TOPROL-XL) 50 MG 24 HR TABLET    Take 25 mg by mouth daily.    MULTIPLE VITAMINS-MINERALS (OCUVITE PO)    Take 2 tablets by mouth daily.   NAPROXEN (NAPROSYN) 500 MG TABLET    Take 500 mg by mouth 2 (two) times daily with a meal.   PRAVASTATIN (PRAVACHOL) 40 MG TABLET    TAKE ONE TABLET BY MOUTH AT BEDTIME   PREDNISOLONE ACETATE (PRED FORTE) 1 % OPHTHALMIC SUSPENSION    Place 1 drop into the right eye 3 (three) times daily.   RANITIDINE (ZANTAC) 150 MG TABLET    Take 300 mg by mouth at bedtime.   RISPERIDONE (RISPERDAL) 1 MG TABLET    Take 1 tablet by mouth every evening.   SERTRALINE (ZOLOFT) 50 MG TABLET       THEOPHYLLINE (THEODUR) 100 MG 12 HR TABLET    Take 100 mg by mouth 2 (two) times daily.   TIOTROPIUM (SPIRIVA) 18 MCG INHALATION CAPSULE    Place 18 mcg into inhaler and inhale daily.    Review of Systems  Constitutional: Negative for fever, chills, appetite change and fatigue.  Respiratory: Negative for chest tightness and shortness of breath.   Cardiovascular: Positive for leg swelling (occasionally). Negative for chest pain and palpitations.  Gastrointestinal: Negative for nausea, vomiting and abdominal pain.  Neurological: Negative for dizziness and weakness.    Social History  Substance Use Topics  . Smoking status: Former Smoker -- 1.00 packs/day for 30 years    Types: Cigarettes  . Smokeless tobacco: Never Used     Comment: quit around 1989  . Alcohol Use: No   Objective:   BP 120/68 mmHg  Pulse 72  Temp(Src) 98.5 F (36.9 C) (Oral)  Resp 16  Wt 162 lb (73.483 kg)  SpO2 95% (O2 2lpm)  Physical Exam   General Appearance:    Alert, cooperative, no distress  Eyes:    PERRL, conjunctiva/corneas clear, EOM's intact        Lungs:     Clear to auscultation bilaterally, respirations unlabored. Diminished breath sounds.   Heart:    Regular rate and rhythm. Heart sounds distant. 1+ bipedal edema with marked varicosities both lower legs.   Neurologic:   Awake, alert, oriented x 3. No apparent focal neurological           defect.       Results for orders placed or performed in visit on 06/09/15  POCT HgB A1C  Result Value Ref Range   Hemoglobin A1C 5.5    Est. average glucose Bld gHb Est-mCnc 111        Assessment & Plan:     1. Controlled type 2 diabetes mellitus without complication, without long-term current use of insulin (HCC) Diet controlled - POCT HgB A1C  2. Recurrent major depressive disorder,  in partial remission ([place]) Doing well on sertraline and risperdone  3. Tremor Improved, continue gabapentin and follow up neurology  4. Hypersomnolence Better on Nuvigil.   5. Hyperlipidemia She is tolerating pravastatin well with no adverse effects.    6. Essential (primary) hypertension Well controlled. Continue current medications.    7. Edema extremities Doing well on current dose of fursemide  Return in about 6 months (around 12/07/2015).      Lelon Huh, MD  [place]

## 2015-06-15 ENCOUNTER — Encounter: Payer: Self-pay | Admitting: General Surgery

## 2015-06-17 DIAGNOSIS — G4711 Idiopathic hypersomnia with long sleep time: Secondary | ICD-10-CM | POA: Diagnosis not present

## 2015-06-17 DIAGNOSIS — R4182 Altered mental status, unspecified: Secondary | ICD-10-CM | POA: Diagnosis not present

## 2015-06-17 DIAGNOSIS — R251 Tremor, unspecified: Secondary | ICD-10-CM | POA: Diagnosis not present

## 2015-06-17 DIAGNOSIS — R5382 Chronic fatigue, unspecified: Secondary | ICD-10-CM | POA: Diagnosis not present

## 2015-06-17 DIAGNOSIS — I671 Cerebral aneurysm, nonruptured: Secondary | ICD-10-CM | POA: Diagnosis not present

## 2015-06-28 ENCOUNTER — Ambulatory Visit: Payer: Commercial Managed Care - HMO

## 2015-06-28 ENCOUNTER — Ambulatory Visit
Admission: RE | Admit: 2015-06-28 | Discharge: 2015-06-28 | Disposition: A | Discharge status: Home / Self Care | Payer: Commercial Managed Care - HMO | Source: Ambulatory Visit | Attending: General Surgery | Admitting: General Surgery

## 2015-06-28 DIAGNOSIS — Z1231 Encounter for screening mammogram for malignant neoplasm of breast: Secondary | ICD-10-CM | POA: Insufficient documentation

## 2015-06-29 DIAGNOSIS — B0052 Herpesviral keratitis: Secondary | ICD-10-CM | POA: Diagnosis not present

## 2015-07-06 ENCOUNTER — Encounter: Payer: Self-pay | Admitting: General Surgery

## 2015-07-06 ENCOUNTER — Ambulatory Visit (INDEPENDENT_AMBULATORY_CARE_PROVIDER_SITE_OTHER): Payer: Commercial Managed Care - HMO | Admitting: General Surgery

## 2015-07-06 ENCOUNTER — Other Ambulatory Visit: Payer: Self-pay | Admitting: Family Medicine

## 2015-07-06 ENCOUNTER — Ambulatory Visit: Payer: Self-pay | Admitting: General Surgery

## 2015-07-06 VITALS — BP 122/60 | HR 70 | Resp 18 | Ht 61.0 in | Wt 163.0 lb

## 2015-07-06 DIAGNOSIS — N6019 Diffuse cystic mastopathy of unspecified breast: Secondary | ICD-10-CM | POA: Diagnosis not present

## 2015-07-06 NOTE — Patient Instructions (Signed)
Patient will be asked to return to the office in one year with a bilateral screening mammogram.  Continue self breast exams. Call office for any new breast issues or concerns.  

## 2015-07-06 NOTE — Progress Notes (Signed)
Patient ID: Toni White, female   DOB: 04-06-1936, 79 y.o.   MRN: [id number]  Chief Complaint  Patient presents with  . Follow-up    mammogram    HPI Toni White is a 79 y.o. female who presents for a breast evaluation. The most recent mammogram was done on 06/28/15.  Patient does perform regular self breast checks and gets regular mammograms done.  She is on oxygen, appears to be in no distress.  I have reviewed the history of present illness with the patient.  HPI  Past Medical History  Diagnosis Date  . Ulcer   . History of chicken pox     Past Surgical History  Procedure Laterality Date  . Colonoscopy  2003    Dr. Vira Agar. Hyperplastic polyps; Single polyp excision from rectum  . Flexible sigmoidoscopy  1990  . Cholecystectomy    . Tonsillectomy  1952  . Appendectomy    . Breast biopsy Left 2005    stereo breast biopsy  . Incision and drainage breast abscess Left 1990  . Abdominal hysterectomy  2003    with BSO due to bleeding  . Ct scan of brain  08/20/2013    Mild diffuse cortical atrophy. Mid chronic ischemic white matter disease. 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 Upper gi endoscopy  02/26/2002    Dr. Tiffany Kocher; Gastritis. Single gastric ectasia    Family History  Problem Relation Age of Onset  . Heart disease Father   . Congestive Heart Failure Father   . Heart disease Mother   . Congestive Heart Failure Mother   . Cancer Brother     lung  . Lung cancer Brother     Social History Social History  Substance Use Topics  . Smoking status: Former Smoker -- 1.00 packs/day for 30 years    Types: Cigarettes  . Smokeless tobacco: Never Used     Comment: quit around 1989  . Alcohol Use: No    Allergies  Allergen Reactions  . Flexeril  [Cyclobenzaprine Hcl]     Confusion  . Reglan [Metoclopramide] Hives  . Relafen [Nabumetone] Hives  . Risperdal  [Risperidone]     Confusion Rash  . Sulfa Antibiotics Hives  . Tape Other (See  Comments)    Per patient pulls her skin off.  . Triamterene-Hctz     Hypercalcemia  . Hctz [Hydrochlorothiazide] Palpitations    Current Outpatient Prescriptions  Medication Sig Dispense Refill  . albuterol (VENTOLIN HFA) 108 (90 BASE) MCG/ACT inhaler Inhale 2 puffs into the lungs. Every 4-6 hours as needed    . amLODipine (NORVASC) 2.5 MG tablet TAKE 1 TABLET EVERY DAY 90 tablet 4  . Armodafinil (NUVIGIL) 200 MG TABS Take 1 tablet by mouth daily.    Marland Kitchen aspirin 81 MG tablet Take 81 mg by mouth daily.    . Calcium-Magnesium-Vitamin D (CALCIUM 500 PO) Take 1 tablet by mouth 2 (two) times daily.    . Fluticasone-Salmeterol (ADVAIR) 250-50 MCG/DOSE AEPB Inhale 1 puff into the lungs 2 (two) times daily.    . furosemide (LASIX) 20 MG tablet Take 20 mg by mouth 2 (two) times daily as needed for edema.     . metoprolol succinate (TOPROL-XL) 50 MG 24 hr tablet Take 25 mg by mouth daily.     . Multiple Vitamins-Minerals (OCUVITE PO) Take 2 tablets by mouth daily.    . naproxen (NAPROSYN) 500 MG tablet Take 500 mg by mouth 2 (two) times  daily with a meal.    . pravastatin (PRAVACHOL) 40 MG tablet TAKE ONE TABLET BY MOUTH AT BEDTIME 30 tablet 1  . prednisoLONE acetate (PRED FORTE) 1 % ophthalmic suspension Place 1 drop into the right eye 3 (three) times daily.    . ranitidine (ZANTAC) 150 MG tablet Take 300 mg by mouth at bedtime.    . risperiDONE (RISPERDAL) 1 MG tablet Take 1 tablet by mouth every evening.    . sertraline (ZOLOFT) 50 MG tablet     . theophylline (THEODUR) 100 MG 12 hr tablet Take 100 mg by mouth 2 (two) times daily.    Marland Kitchen tiotropium (SPIRIVA) 18 MCG inhalation capsule Place 18 mcg into inhaler and inhale daily.     No current facility-administered medications for this visit.    Review of Systems Review of Systems  Constitutional: Negative.   Respiratory: Negative.   Cardiovascular: Negative.     Blood pressure 122/60, pulse 70, resp. rate 18, height 5\' 1"  (1.549 m), weight  163 lb (73.936 kg).  Physical Exam Physical Exam  Constitutional: She is oriented to person, place, and time. She appears well-developed and well-nourished.  Patient with O2 through nasal cannula   Eyes: Conjunctivae are normal. No scleral icterus.  Neck: Neck supple.  Cardiovascular: Normal rate, regular rhythm and normal heart sounds.   Pulmonary/Chest: Effort normal. She has wheezes (diffuse wheezing b/l). Right breast exhibits no inverted nipple, no mass, no nipple discharge, no skin change and no tenderness. Left breast exhibits no inverted nipple, no mass, no nipple discharge, no skin change and no tenderness.  Abdominal: Soft. Normal appearance and bowel sounds are normal. There is no hepatomegaly. There is no tenderness. No hernia.  Lymphadenopathy:    She has no cervical adenopathy.    She has no axillary adenopathy.  Neurological: She is alert and oriented to person, place, and time.  Skin: Skin is warm and dry.    Data Reviewed Mammogram reviewed, stable findings.  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     Plan    Patient will be asked to return to the office in one year with a bilateral screening mammogram.     PCP:  Beatrice Lecher 07/06/2015, 10:24 AM

## 2015-08-24 ENCOUNTER — Telehealth: Payer: Self-pay | Admitting: Family Medicine

## 2015-08-24 NOTE — Telephone Encounter (Signed)
Toni White with [employer] called stating pt is being [facility] on 09[facility].  Pt oxygen will need to be switched back to the place  she was getting it from before.  A new oxygen Rx will need to sent.  [id number]

## 2015-08-25 NOTE — Telephone Encounter (Signed)
Can you please order Home oxygen at 2lpm for this patient. Thanks.

## 2015-08-27 ENCOUNTER — Ambulatory Visit (INDEPENDENT_AMBULATORY_CARE_PROVIDER_SITE_OTHER): Admitting: Family Medicine

## 2015-08-27 ENCOUNTER — Encounter: Payer: Self-pay | Admitting: Family Medicine

## 2015-08-27 VITALS — BP 102/52 | HR 72 | Temp 98.8°F | Resp 18 | Wt 163.0 lb

## 2015-08-27 DIAGNOSIS — J449 Chronic obstructive pulmonary disease, unspecified: Secondary | ICD-10-CM | POA: Diagnosis not present

## 2015-08-27 NOTE — Progress Notes (Signed)
Patient: Toni White Female    DOB: Oct 15, 1935   80 y.o.   MRN: [id number] Visit Date: 08/27/2015  Today's Provider: Lelon Huh, MD   Chief Complaint  Patient presents with  . Follow-up    Hypoxia   Subjective:    HPI  COPD Follow up:   Has been doing well on current regiment of inhalers and continues to follow up with Dr. [name]. She was recently discharged from hospice and had been on continuous oxygen, but now needs evaluation to see if she continues to qualify for oxygen through home health. She is currently on 2L 02 continuous.     Allergies  Allergen Reactions  . Flexeril  [Cyclobenzaprine Hcl]     Confusion  . Reglan [Metoclopramide] Hives  . Relafen [Nabumetone] Hives  . Risperdal  [Risperidone]     Confusion Rash  . Sulfa Antibiotics Hives  . Tape Other (See Comments)    Per patient pulls her skin off.  . Triamterene-Hctz     Hypercalcemia  . Hctz [Hydrochlorothiazide] Palpitations   Previous Medications   ALBUTEROL (VENTOLIN HFA) 108 (90 BASE) MCG/ACT INHALER    Inhale 2 puffs into the lungs. Every 4-6 hours as needed   AMLODIPINE (NORVASC) 2.5 MG TABLET    TAKE 1 TABLET EVERY DAY   ARMODAFINIL (NUVIGIL) 200 MG TABS    Take 1 tablet by mouth daily.   ASPIRIN 81 MG TABLET    Take 81 mg by mouth daily.   CALCIUM-MAGNESIUM-VITAMIN D (CALCIUM 500 PO)    Take 1 tablet by mouth 2 (two) times daily.   FLUTICASONE-SALMETEROL (ADVAIR) 250-50 MCG/DOSE AEPB    Inhale 1 puff into the lungs 2 (two) times daily.   FUROSEMIDE (LASIX) 20 MG TABLET    Take 20 mg by mouth 2 (two) times daily as needed for edema.    METOPROLOL SUCCINATE (TOPROL-XL) 50 MG 24 HR TABLET    Take 25 mg by mouth daily.    MULTIPLE VITAMINS-MINERALS (OCUVITE PO)    Take 2 tablets by mouth daily.   NAPROXEN (NAPROSYN) 500 MG TABLET    Take 500 mg by mouth 2 (two) times daily with a meal.   PRAVASTATIN (PRAVACHOL) 40 MG TABLET    TAKE ONE TABLET BY MOUTH EVERY NIGHT AT BEDTIME   PREDNISOLONE  ACETATE (PRED FORTE) 1 % OPHTHALMIC SUSPENSION    Place 1 drop into the right eye 3 (three) times daily.   RANITIDINE (ZANTAC) 150 MG TABLET    Take 300 mg by mouth at bedtime.   THEOPHYLLINE (THEODUR) 100 MG 12 HR TABLET    Take 100 mg by mouth 2 (two) times daily.   TIOTROPIUM (SPIRIVA) 18 MCG INHALATION CAPSULE    Place 18 mcg into inhaler and inhale daily.    Review of Systems  Constitutional: Negative for fever, chills, appetite change and fatigue.  Respiratory: Negative for chest tightness and shortness of breath.   Cardiovascular: Negative for chest pain and palpitations.  Gastrointestinal: Negative for nausea, vomiting and abdominal pain.  Neurological: Negative for dizziness and weakness.    Social History  Substance Use Topics  . Smoking status: Former Smoker -- 1.00 packs/day for 30 years    Types: Cigarettes  . Smokeless tobacco: Never Used     Comment: quit around 1989  . Alcohol Use: No   Objective:   BP 102/52 mmHg  Pulse 72  Temp(Src) 98.8 F (37.1 C) (Oral)  Resp 18  Wt 163  lb (73.936 kg)  SpO2 89%  Sp02 96%  2L O2 Nasal Canula Sp02  95%  Room air  Sitting Sp02  88%  Room air  Walking SpO2 94% 2L O2 Walking  Physical Exam   General Appearance:    Alert, cooperative, no distress  Eyes:    PERRL, conjunctiva/corneas clear, EOM's intact       Lungs:     Clear to auscultation bilaterally, respirations unlabored  Heart:    Regular rate and rhythm  Neurologic:   Awake, alert, oriented x 3. No apparent focal neurological           defect.          Assessment & Plan:     1. Chronic obstructive pulmonary disease, unspecified COPD type (HCC) Severe  - Pulse oximetry, overnight; Future. Need to evaluate qualifications for nocturnal Oxygen.        Lelon Huh, MD  [facility]

## 2015-08-31 NOTE — Telephone Encounter (Signed)
Order for home oxygen faxed to [facility].Their office will contact pt.Phone [phone]

## 2015-09-02 DIAGNOSIS — J449 Chronic obstructive pulmonary disease, unspecified: Secondary | ICD-10-CM | POA: Diagnosis not present

## 2015-09-14 ENCOUNTER — Other Ambulatory Visit: Payer: Self-pay | Admitting: Family Medicine

## 2015-10-03 DIAGNOSIS — J449 Chronic obstructive pulmonary disease, unspecified: Secondary | ICD-10-CM | POA: Diagnosis not present

## 2015-10-05 DIAGNOSIS — J449 Chronic obstructive pulmonary disease, unspecified: Secondary | ICD-10-CM | POA: Diagnosis not present

## 2015-10-05 DIAGNOSIS — R0902 Hypoxemia: Secondary | ICD-10-CM | POA: Diagnosis not present

## 2015-10-05 DIAGNOSIS — R0609 Other forms of dyspnea: Secondary | ICD-10-CM | POA: Diagnosis not present

## 2015-10-05 DIAGNOSIS — G4711 Idiopathic hypersomnia with long sleep time: Secondary | ICD-10-CM | POA: Diagnosis not present

## 2015-10-18 ENCOUNTER — Other Ambulatory Visit: Payer: Self-pay | Admitting: Family Medicine

## 2015-10-31 DIAGNOSIS — J449 Chronic obstructive pulmonary disease, unspecified: Secondary | ICD-10-CM | POA: Diagnosis not present

## 2015-12-01 DIAGNOSIS — E782 Mixed hyperlipidemia: Secondary | ICD-10-CM | POA: Diagnosis not present

## 2015-12-01 DIAGNOSIS — I725 Aneurysm of other precerebral arteries: Secondary | ICD-10-CM | POA: Diagnosis not present

## 2015-12-01 DIAGNOSIS — J449 Chronic obstructive pulmonary disease, unspecified: Secondary | ICD-10-CM | POA: Diagnosis not present

## 2015-12-07 ENCOUNTER — Encounter: Payer: Self-pay | Admitting: Family Medicine

## 2015-12-07 ENCOUNTER — Ambulatory Visit (INDEPENDENT_AMBULATORY_CARE_PROVIDER_SITE_OTHER): Payer: Commercial Managed Care - HMO | Admitting: Family Medicine

## 2015-12-07 VITALS — BP 122/70 | HR 85 | Temp 98.1°F | Resp 16 | Wt 164.0 lb

## 2015-12-07 DIAGNOSIS — R7303 Prediabetes: Secondary | ICD-10-CM | POA: Diagnosis not present

## 2015-12-07 DIAGNOSIS — G4710 Hypersomnia, unspecified: Secondary | ICD-10-CM

## 2015-12-07 DIAGNOSIS — F3341 Major depressive disorder, recurrent, in partial remission: Secondary | ICD-10-CM | POA: Diagnosis not present

## 2015-12-07 DIAGNOSIS — J449 Chronic obstructive pulmonary disease, unspecified: Secondary | ICD-10-CM | POA: Diagnosis not present

## 2015-12-07 DIAGNOSIS — E785 Hyperlipidemia, unspecified: Secondary | ICD-10-CM | POA: Diagnosis not present

## 2015-12-07 DIAGNOSIS — I10 Essential (primary) hypertension: Secondary | ICD-10-CM | POA: Diagnosis not present

## 2015-12-07 NOTE — Progress Notes (Signed)
Patient: Toni White Female    DOB: [birth year]   80 y.o.   MRN: [id number] Visit Date: 12/07/2015  Today's Provider: Lelon Huh, MD   Chief Complaint  Patient presents with  . Hypertension    follow up  . COPD    follow up  . Depression    follow up  . Hyperlipidemia    follow up  . Hyperglycemia    follow up   Subjective:    HPI  Hypertension, follow-up:  BP Readings from Last 3 Encounters:  12/07/15 122/70  08/27/15 102/52  07/06/15 122/60    She was last seen for hypertension 6 months ago.  BP at that visit was  122/60. Management since that visit includes no changes. She reports good compliance with treatment. She is not having side effects.  She is not exercising. She is adherent to low salt diet.   Outside blood pressures are 120/70. She is experiencing fatigue and lower extremity edema.  Patient denies chest pain, chest pressure/discomfort, claudication, exertional chest pressure/discomfort, irregular heart beat, near-syncope, orthopnea, palpitations, paroxysmal nocturnal dyspnea, syncope and tachypnea.   Cardiovascular risk factors include advanced age (older than 20 for men, 70 for women), dyslipidemia and hypertension.  Use of agents associated with hypertension: NSAIDS.     Weight trend: stable Wt Readings from Last 3 Encounters:  12/07/15 164 lb (74.39 kg)  08/27/15 163 lb (73.936 kg)  07/06/15 163 lb (73.936 kg)    Current diet: in general, a "healthy" diet    ------------------------------------------------------------------------  Follow up COPD: Last office visit was 3 months ago. No changes were made. Overnight oximetry was ordered to evaluate qualifications of nocturnal oxygen therapy. Since last visit, patient states she has been seen by Dr. Vella Kohler who has discontinued continuous oxygen therapy. Patient now only uses oxygen therapy at night.   Follow up Depression: Last office visit was 6 months ago and no changes were made.  Patient feels her Depression has improved since the last visit. Her mood has generally been good. No more episodes of hypersomnolence.    Lipid/Cholesterol, Follow-up:   Last seen for this6 months ago.  Management changes since that visit include no changes. . Last Lipid Panel:    Component Value Date/Time   CHOL 148 12/09/2014   TRIG 184* 12/09/2014   HDL 42 12/09/2014   LDLCALC 69 12/09/2014    Risk factors for vascular disease include hypercholesterolemia and hypertension  She reports good compliance with treatment. She is not having side effects.  Current symptoms include none and have been stable. Weight trend: stable Prior visit with dietician: no Current diet: in general, a "healthy" diet   Current exercise: none  Wt Readings from Last 3 Encounters:  12/07/15 164 lb (74.39 kg)  08/27/15 163 lb (73.936 kg)  07/06/15 163 lb (73.936 kg)    -------------------------------------------------------------------  Prediabetes, Follow-up:   Lab Results  Component Value Date   HGBA1C 5.5 06/09/2015   HGBA1C 5.9 12/09/2014   GLUCOSE 139* 01/27/2014   GLUCOSE 122* 01/26/2014   GLUCOSE 89 08/21/2013    Last seen for for this6 months ago.  Management since that visit includes no changes. Current symptoms include none and have been stable.  Weight trend: stable Prior visit with dietician: no Current diet: in general, a "healthy" diet   Current exercise: none  Pertinent Labs:    Component Value Date/Time   CHOL 148 12/09/2014   TRIG 184* 12/09/2014   CREATININE 0.9 12/09/2014  CREATININE 0.83 01/27/2014 0551    Wt Readings from Last 3 Encounters:  12/07/15 164 lb (74.39 kg)  08/27/15 163 lb (73.936 kg)  07/06/15 163 lb (73.936 kg)       Allergies  Allergen Reactions  . Flexeril  [Cyclobenzaprine Hcl]     Confusion  . Reglan [Metoclopramide] Hives  . Relafen [Nabumetone] Hives  . Risperdal  [Risperidone]     Confusion Rash  . Sulfa Antibiotics  Hives  . Tape Other (See Comments)    Per patient pulls her skin off.  . Triamterene-Hctz     Hypercalcemia  . Hctz [Hydrochlorothiazide] Palpitations   Previous Medications   ALBUTEROL (VENTOLIN HFA) 108 (90 BASE) MCG/ACT INHALER    Inhale 2 puffs into the lungs. Every 4-6 hours as needed   AMLODIPINE (NORVASC) 2.5 MG TABLET    TAKE 1 TABLET EVERY DAY   ARMODAFINIL (NUVIGIL) 200 MG TABS    Take 1 tablet by mouth daily. Reported on 12/07/2015   ASPIRIN 81 MG TABLET    Take 81 mg by mouth daily.   CALCIUM-MAGNESIUM-VITAMIN D (CALCIUM 500 PO)    Take 1 tablet by mouth 2 (two) times daily.   FLUTICASONE-SALMETEROL (ADVAIR) 250-50 MCG/DOSE AEPB    Inhale 1 puff into the lungs 2 (two) times daily.   FUROSEMIDE (LASIX) 20 MG TABLET    TAKE 1 TABLET TWICE DAILY AS NEEDED  FOR  SWELLING   METOPROLOL SUCCINATE (TOPROL-XL) 50 MG 24 HR TABLET    Take 25 mg by mouth daily.    MULTIPLE VITAMINS-MINERALS (OCUVITE PO)    Take 2 tablets by mouth daily.   NAPROXEN (NAPROSYN) 500 MG TABLET    Take 500 mg by mouth 2 (two) times daily with a meal.   PRAVASTATIN (PRAVACHOL) 40 MG TABLET    TAKE ONE TABLET BY MOUTH EVERY NIGHT AT BEDTIME   PREDNISOLONE ACETATE (PRED FORTE) 1 % OPHTHALMIC SUSPENSION    Place 1 drop into the right eye 3 (three) times daily.   RANITIDINE (ZANTAC) 300 MG TABLET    TAKE 1 TABLET AT BEDTIME   THEOPHYLLINE (THEODUR) 100 MG 12 HR TABLET    Take 100 mg by mouth 2 (two) times daily.   TIOTROPIUM (SPIRIVA) 18 MCG INHALATION CAPSULE    Place 18 mcg into inhaler and inhale daily.    Review of Systems  Constitutional: Positive for fatigue. Negative for fever, chills and appetite change.  Respiratory: Negative for chest tightness and shortness of breath.   Cardiovascular: Positive for leg swelling. Negative for chest pain and palpitations.  Gastrointestinal: Negative for nausea, vomiting and abdominal pain.  Neurological: Negative for dizziness and weakness.    Social History  Substance  Use Topics  . Smoking status: Former Smoker -- 1.00 packs/day for 30 years    Types: Cigarettes  . Smokeless tobacco: Never Used     Comment: quit around 1989  . Alcohol Use: No   Objective:   BP 122/70 mmHg  Pulse 85  Temp(Src) 98.1 F (36.7 C) (Oral)  Resp 16  Wt 164 lb (74.39 kg)  SpO2 96%  Physical Exam   General Appearance:    Alert, cooperative, no distress  Eyes:    PERRL, conjunctiva/corneas clear, EOM's intact       Lungs:     Clear to auscultation bilaterally, respirations unlabored  Heart:    Regular rate and rhythm  Neurologic:   Awake, alert, oriented x 3. No apparent focal neurological  defect.          Assessment & Plan:     1. Essential (primary) hypertension Well controlled.  Continue current medications.   - Renal function panel  2. Hyperlipidemia She is tolerating pravastatin well with no adverse effects.   - Lipid panel  3. Chronic obstructive pulmonary disease, unspecified COPD type ([place]) Doing well, continue routine follow up Dr. [name]  4. Prediabetes  - Hemoglobin A1c  5. Hypersomnolence Now resolved. Continue Nuvigil  6. Recurrent major depressive disorder, in partial remission ([place]) Doing well off of antidepressants at this time.   Completed handicap parking application today.  If labs normal will follow up o.v. And A1c in 6 months       Lelon Huh, MD  [facility]

## 2015-12-08 LAB — LIPID PANEL
CHOL/HDL RATIO: 3.5 ratio (ref 0.0–4.4)
Cholesterol, Total: 153 mg/dL (ref 100–199)
HDL: 44 mg/dL (ref >39)
LDL CALC: 77 mg/dL (ref 0–99)
TRIGLYCERIDES: 161 mg/dL — AB (ref 0–149)
VLDL CHOLESTEROL CAL: 32 mg/dL (ref 5–40)

## 2015-12-08 LAB — RENAL FUNCTION PANEL
ALBUMIN: 4.2 g/dL (ref 3.5–4.8)
BUN/Creatinine Ratio: 17 (ref 12–28)
BUN: 17 mg/dL (ref 8–27)
CHLORIDE: 99 mmol/L (ref 96–106)
CO2: 30 mmol/L — AB (ref 18–29)
CREATININE: 0.98 mg/dL (ref 0.57–1.00)
Calcium: 10.9 mg/dL — ABNORMAL HIGH (ref 8.7–10.3)
GFR, EST AFRICAN AMERICAN: 64 mL/min/{1.73_m2} (ref >59)
GFR, EST NON AFRICAN AMERICAN: 56 mL/min/{1.73_m2} — AB (ref >59)
Glucose: 108 mg/dL — ABNORMAL HIGH (ref 65–99)
Phosphorus: 3.9 mg/dL (ref 2.5–4.5)
Potassium: 4.7 mmol/L (ref 3.5–5.2)
Sodium: 145 mmol/L — ABNORMAL HIGH (ref 134–144)

## 2015-12-08 LAB — HEMOGLOBIN A1C
Est. average glucose Bld gHb Est-mCnc: 126 mg/dL
HEMOGLOBIN A1C: 6 % — AB (ref 4.8–5.6)

## 2015-12-28 DIAGNOSIS — E119 Type 2 diabetes mellitus without complications: Secondary | ICD-10-CM | POA: Diagnosis not present

## 2015-12-28 LAB — HM DIABETES EYE EXAM

## 2015-12-29 ENCOUNTER — Encounter: Payer: Self-pay | Admitting: *Deleted

## 2015-12-31 DIAGNOSIS — J449 Chronic obstructive pulmonary disease, unspecified: Secondary | ICD-10-CM | POA: Diagnosis not present

## 2016-01-18 DIAGNOSIS — R5382 Chronic fatigue, unspecified: Secondary | ICD-10-CM | POA: Diagnosis not present

## 2016-01-18 DIAGNOSIS — I725 Aneurysm of other precerebral arteries: Secondary | ICD-10-CM | POA: Diagnosis not present

## 2016-01-18 DIAGNOSIS — R4182 Altered mental status, unspecified: Secondary | ICD-10-CM | POA: Diagnosis not present

## 2016-01-18 DIAGNOSIS — G250 Essential tremor: Secondary | ICD-10-CM | POA: Diagnosis not present

## 2016-01-31 DIAGNOSIS — J449 Chronic obstructive pulmonary disease, unspecified: Secondary | ICD-10-CM | POA: Diagnosis not present

## 2016-02-02 ENCOUNTER — Other Ambulatory Visit: Payer: Self-pay | Admitting: Family Medicine

## 2016-02-10 DIAGNOSIS — R0902 Hypoxemia: Secondary | ICD-10-CM | POA: Diagnosis not present

## 2016-02-10 DIAGNOSIS — J449 Chronic obstructive pulmonary disease, unspecified: Secondary | ICD-10-CM | POA: Diagnosis not present

## 2016-02-15 DIAGNOSIS — Z85828 Personal history of other malignant neoplasm of skin: Secondary | ICD-10-CM | POA: Diagnosis not present

## 2016-02-15 DIAGNOSIS — L821 Other seborrheic keratosis: Secondary | ICD-10-CM | POA: Diagnosis not present

## 2016-02-15 DIAGNOSIS — Z08 Encounter for follow-up examination after completed treatment for malignant neoplasm: Secondary | ICD-10-CM | POA: Diagnosis not present

## 2016-02-15 DIAGNOSIS — D225 Melanocytic nevi of trunk: Secondary | ICD-10-CM | POA: Diagnosis not present

## 2016-03-01 DIAGNOSIS — J449 Chronic obstructive pulmonary disease, unspecified: Secondary | ICD-10-CM | POA: Diagnosis not present

## 2016-04-01 DIAGNOSIS — J449 Chronic obstructive pulmonary disease, unspecified: Secondary | ICD-10-CM | POA: Diagnosis not present

## 2016-04-29 ENCOUNTER — Ambulatory Visit (INDEPENDENT_AMBULATORY_CARE_PROVIDER_SITE_OTHER): Payer: Commercial Managed Care - HMO

## 2016-04-29 DIAGNOSIS — Z23 Encounter for immunization: Secondary | ICD-10-CM | POA: Diagnosis not present

## 2016-04-29 NOTE — Addendum Note (Signed)
Addended by: Althea Charon D on: 04/29/2016 10:21 AM   Modules accepted: Orders

## 2016-05-02 DIAGNOSIS — J449 Chronic obstructive pulmonary disease, unspecified: Secondary | ICD-10-CM | POA: Diagnosis not present

## 2016-05-16 ENCOUNTER — Other Ambulatory Visit: Payer: Self-pay | Admitting: General Surgery

## 2016-05-16 DIAGNOSIS — Z1239 Encounter for other screening for malignant neoplasm of breast: Secondary | ICD-10-CM

## 2016-05-25 ENCOUNTER — Encounter: Payer: Self-pay | Admitting: *Deleted

## 2016-06-01 DIAGNOSIS — J449 Chronic obstructive pulmonary disease, unspecified: Secondary | ICD-10-CM | POA: Diagnosis not present

## 2016-06-05 DIAGNOSIS — J449 Chronic obstructive pulmonary disease, unspecified: Secondary | ICD-10-CM | POA: Diagnosis not present

## 2016-06-05 DIAGNOSIS — I725 Aneurysm of other precerebral arteries: Secondary | ICD-10-CM | POA: Diagnosis not present

## 2016-06-05 DIAGNOSIS — E782 Mixed hyperlipidemia: Secondary | ICD-10-CM | POA: Diagnosis not present

## 2016-06-05 DIAGNOSIS — I10 Essential (primary) hypertension: Secondary | ICD-10-CM | POA: Diagnosis not present

## 2016-06-08 ENCOUNTER — Encounter: Payer: Self-pay | Admitting: Family Medicine

## 2016-06-08 ENCOUNTER — Ambulatory Visit (INDEPENDENT_AMBULATORY_CARE_PROVIDER_SITE_OTHER): Payer: Commercial Managed Care - HMO | Admitting: Family Medicine

## 2016-06-08 VITALS — BP 104/64 | HR 72 | Temp 98.1°F | Resp 16 | Ht 61.0 in | Wt 149.0 lb

## 2016-06-08 DIAGNOSIS — R7303 Prediabetes: Secondary | ICD-10-CM | POA: Diagnosis not present

## 2016-06-08 DIAGNOSIS — I10 Essential (primary) hypertension: Secondary | ICD-10-CM | POA: Diagnosis not present

## 2016-06-08 DIAGNOSIS — S39011D Strain of muscle, fascia and tendon of abdomen, subsequent encounter: Secondary | ICD-10-CM

## 2016-06-08 DIAGNOSIS — F3341 Major depressive disorder, recurrent, in partial remission: Secondary | ICD-10-CM

## 2016-06-08 DIAGNOSIS — J449 Chronic obstructive pulmonary disease, unspecified: Secondary | ICD-10-CM

## 2016-06-08 LAB — POCT GLYCOSYLATED HEMOGLOBIN (HGB A1C)
ESTIMATED AVERAGE GLUCOSE: 123
Hemoglobin A1C: 5.9

## 2016-06-08 NOTE — Progress Notes (Signed)
Patient: Toni White Female    DOB: [birth year]   80 y.o.   MRN: [id number] Visit Date: 06/08/2016  Today's Provider: Lelon Huh, MD   Chief Complaint  Patient presents with  . Follow-up  . Hypertension  . Depression  . Hyperlipidemia   Subjective:    Patient has been having back spasms and also a soreness in her lower abdomin for several weeks. No urine symptoms.     Prediabetes From 12/07/2015-Hemoglobin A1c 6.0, no changes made at last visit.    Recurrent major depressive disorder, in partial remission ([place]) From 12/07/2015-Doing well off of antidepressants at that time. States her energy level has been much better the last several month. No episodes of hypersomnolence.    Hypertension, follow-up:  BP Readings from Last 3 Encounters:  06/08/16 104/64  12/07/15 122/70  08/27/15 (!) 102/52    She was last seen for hypertension 6 months ago.  BP at that visit was 122/70. Management since that visit includes; no changes.She reports good compliance with treatment. She is not having side effects. none She is not exercising. She is adherent to low salt diet.   Outside blood pressures are normal. She is experiencing none.  Patient denies none.   Cardiovascular risk factors include none and prediabetes.  Use of agents associated with hypertension: none.   ----------------------------------------------------------------    Lipid/Cholesterol, Follow-up:   Last seen for this 6 months ago.  Management since that visit includes; no changes.  Last Lipid Panel:    Component Value Date/Time   CHOL 153 12/07/2015 0846   TRIG 161 (H) 12/07/2015 0846   HDL 44 12/07/2015 0846   CHOLHDL 3.5 12/07/2015 0846   LDLCALC 77 12/07/2015 0846    She reports good compliance with treatment. She is not having side effects. none  Wt Readings from Last 3 Encounters:  06/08/16 149 lb (67.6 kg)  12/07/15 164 lb (74.4 kg)  08/27/15 163 lb (73.9 kg)     ---------------------------------------------------------------- COPD.  Continue regular follow up with Dr. [name] and reports she is doing well. Is off daytime oxygen, but still using at night.   Allergies  Allergen Reactions  . Flexeril  [Cyclobenzaprine Hcl]     Confusion  . Reglan [Metoclopramide] Hives  . Relafen [Nabumetone] Hives  . Risperdal  [Risperidone]     Confusion Rash  . Sulfa Antibiotics Hives  . Tape Other (See Comments)    Per patient pulls her skin off.  . Triamterene-Hctz     Hypercalcemia  . Hctz [Hydrochlorothiazide] Palpitations     Current Outpatient Prescriptions:  .  albuterol (VENTOLIN HFA) 108 (90 BASE) MCG/ACT inhaler, Inhale 2 puffs into the lungs. Every 4-6 hours as needed, Disp: , Rfl:  .  amLODipine (NORVASC) 2.5 MG tablet, TAKE 1 TABLET EVERY DAY, Disp: 90 tablet, Rfl: 4 .  Armodafinil (NUVIGIL) 200 MG TABS, Take 1 tablet by mouth daily. Reported on 12/07/2015, Disp: , Rfl:  .  aspirin 81 MG tablet, Take 81 mg by mouth daily., Disp: , Rfl:  .  Calcium-Magnesium-Vitamin D (CALCIUM 500 PO), Take 1 tablet by mouth 2 (two) times daily., Disp: , Rfl:  .  Fluticasone-Salmeterol (ADVAIR) 250-50 MCG/DOSE AEPB, Inhale 1 puff into the lungs 2 (two) times daily., Disp: , Rfl:  .  furosemide (LASIX) 20 MG tablet, TAKE 1 TABLET TWICE DAILY AS NEEDED  FOR  SWELLING, Disp: 90 tablet, Rfl: 3 .  metoprolol succinate (TOPROL-XL) 50 MG 24 hr tablet, TAKE  1/2 TABLET EVERY DAY, Disp: 45 tablet, Rfl: 3 .  Multiple Vitamins-Minerals (OCUVITE PO), Take 2 tablets by mouth daily., Disp: , Rfl:  .  naproxen (NAPROSYN) 500 MG tablet, Take 500 mg by mouth 2 (two) times daily with a meal., Disp: , Rfl:  .  pravastatin (PRAVACHOL) 40 MG tablet, TAKE ONE TABLET BY MOUTH EVERY NIGHT AT BEDTIME, Disp: 30 tablet, Rfl: 12 .  ranitidine (ZANTAC) 300 MG tablet, TAKE 1 TABLET AT BEDTIME, Disp: 90 tablet, Rfl: 3 .  theophylline (THEODUR) 100 MG 12 hr tablet, Take 100 mg by mouth 2  (two) times daily., Disp: , Rfl:  .  tiotropium (SPIRIVA) 18 MCG inhalation capsule, Place 18 mcg into inhaler and inhale daily., Disp: , Rfl:   Review of Systems  Constitutional: Negative for appetite change, chills, fatigue and fever.  Respiratory: Negative for chest tightness and shortness of breath.   Cardiovascular: Negative for chest pain and palpitations.  Gastrointestinal: Negative for abdominal pain, nausea and vomiting.  Musculoskeletal: Positive for back pain.  Neurological: Negative for dizziness and weakness.    Social History  Substance Use Topics  . Smoking status: Former Smoker    Packs/day: 1.00    Years: 30.00    Types: Cigarettes  . Smokeless tobacco: Never Used     Comment: quit around 1989  . Alcohol use No   Objective:   BP 104/64 (BP Location: Right Arm, Patient Position: Sitting, Cuff Size: Normal)   Pulse 72   Temp 98.1 F (36.7 C) (Oral)   Resp 16   Ht 5\' 1"  (1.549 m)   Wt 149 lb (67.6 kg)   SpO2 95%   BMI 28.15 kg/m   Physical Exam  General Appearance:    Alert, cooperative, no distress  Eyes:    PERRL, conjunctiva/corneas clear, EOM's intact       Lungs:     Clear to auscultation bilaterally, respirations unlabored  Heart:    Regular rate and rhythm  Abdomen:   bowel sounds present and normal in all 4 quadrants, soft andround. Mild tenderness of abdominal wall above umbilicus. Positive Carnett's sign. No CVA tenderness        Assessment & Plan:     1. Prediabetes Well controlled.  Continue current medications.   - POCT glycosylated hemoglobin (Hb A1C)  2. Essential (primary) hypertension Well controlled.  Continue current medications.    3. Chronic obstructive pulmonary disease, unspecified COPD type ([place]) Doing well off of daytime oxygen. Continue current medications.    4. Recurrent major depressive disorder, in partial remission ([place]) Doing well off of SSRI. On Nuvigil for hypersomnolence per Dr. Melrose Nakayama.   5. Strain of  abdominal wall, subsequent encounter Mild. Advised to call if this worsens are not clearing up in a week or two.   Return in about 6 months (around 12/06/2016).        Lelon Huh, MD  [facility]

## 2016-06-13 DIAGNOSIS — G4734 Idiopathic sleep related nonobstructive alveolar hypoventilation: Secondary | ICD-10-CM | POA: Diagnosis not present

## 2016-06-13 DIAGNOSIS — R0602 Shortness of breath: Secondary | ICD-10-CM | POA: Diagnosis not present

## 2016-06-13 DIAGNOSIS — J449 Chronic obstructive pulmonary disease, unspecified: Secondary | ICD-10-CM | POA: Diagnosis not present

## 2016-06-28 ENCOUNTER — Ambulatory Visit
Admission: RE | Admit: 2016-06-28 | Discharge: 2016-06-28 | Disposition: A | Discharge status: Home / Self Care | Payer: Commercial Managed Care - HMO | Source: Ambulatory Visit | Attending: General Surgery | Admitting: General Surgery

## 2016-06-28 DIAGNOSIS — Z1231 Encounter for screening mammogram for malignant neoplasm of breast: Secondary | ICD-10-CM | POA: Insufficient documentation

## 2016-06-28 DIAGNOSIS — Z1239 Encounter for other screening for malignant neoplasm of breast: Secondary | ICD-10-CM

## 2016-07-02 DIAGNOSIS — J449 Chronic obstructive pulmonary disease, unspecified: Secondary | ICD-10-CM | POA: Diagnosis not present

## 2016-07-04 ENCOUNTER — Encounter: Payer: Self-pay | Admitting: *Deleted

## 2016-07-05 ENCOUNTER — Other Ambulatory Visit: Payer: Self-pay | Admitting: Family Medicine

## 2016-07-06 ENCOUNTER — Ambulatory Visit (INDEPENDENT_AMBULATORY_CARE_PROVIDER_SITE_OTHER): Payer: Commercial Managed Care - HMO | Admitting: General Surgery

## 2016-07-06 ENCOUNTER — Encounter: Payer: Self-pay | Admitting: General Surgery

## 2016-07-06 VITALS — BP 130/74 | HR 68 | Resp 14 | Ht 61.0 in | Wt 158.0 lb

## 2016-07-06 DIAGNOSIS — N6019 Diffuse cystic mastopathy of unspecified breast: Secondary | ICD-10-CM

## 2016-07-06 DIAGNOSIS — H40003 Preglaucoma, unspecified, bilateral: Secondary | ICD-10-CM | POA: Diagnosis not present

## 2016-07-06 LAB — HM DIABETES EYE EXAM

## 2016-07-06 NOTE — Progress Notes (Signed)
Patient ID: Toni White, female   DOB: 1936/01/23, 80 y.o.   MRN: [id number]  Chief Complaint  Patient presents with  . Follow-up    mammogram    HPI Toni White is a 80 y.o. female who presents for a breast evaluation. The most recent mammogram was done on 06-28-16.  Patient does perform regular self breast checks and gets regular mammograms done.  No new breast complaints. I have reviewed the history of present illness with the patient.  HPI  Past Medical History:  Diagnosis Date  . History of chicken pox   . Hypertension   . Ulcer [facility])     Past Surgical History:  Procedure Laterality Date  . ABDOMINAL HYSTERECTOMY  2003   with BSO due to bleeding  . APPENDECTOMY    . BREAST BIOPSY Left 2005   stereo breast biopsy  . CHOLECYSTECTOMY    . COLONOSCOPY  2003   Dr. Vira Agar. Hyperplastic polyps; Single polyp excision from rectum  . CT Scan of Brain  08/20/2013   Mild diffuse cortical atrophy. Mid chronic ischemic white matter disease. 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 [place]  . INCISION AND DRAINAGE BREAST ABSCESS Left 1990  . TONSILLECTOMY  1952  . UPPER GI ENDOSCOPY  02/26/2002   Dr. Tiffany Kocher; Gastritis. Single gastric ectasia    Family History  Problem Relation Age of Onset  . Heart disease Father   . Congestive Heart Failure Father   . Heart disease Mother   . Congestive Heart Failure Mother   . Cancer Brother     lung  . Lung cancer Brother     Social History Social History  Substance Use Topics  . Smoking status: Former Smoker    Packs/day: 1.00    Years: 30.00    Types: Cigarettes  . Smokeless tobacco: Never Used     Comment: quit around 1989  . Alcohol use No    Allergies  Allergen Reactions  . Flexeril  [Cyclobenzaprine Hcl]     Confusion  . Reglan [Metoclopramide] Hives  . Relafen [Nabumetone] Hives  . Risperdal  [Risperidone]     Confusion Rash  . Sulfa Antibiotics Hives  . Tape Other (See  Comments)    Per patient pulls her skin off.  . Triamterene-Hctz     Hypercalcemia  . Hctz [Hydrochlorothiazide] Palpitations    Current Outpatient Prescriptions  Medication Sig Dispense Refill  . albuterol (VENTOLIN HFA) 108 (90 BASE) MCG/ACT inhaler Inhale 2 puffs into the lungs. Every 4-6 hours as needed    . amLODipine (NORVASC) 2.5 MG tablet TAKE 1 TABLET EVERY DAY 90 tablet 4  . aspirin 81 MG tablet Take 81 mg by mouth daily.    . Calcium-Magnesium-Vitamin D (CALCIUM 500 PO) Take 1 tablet by mouth 2 (two) times daily.    . citalopram (CELEXA) 20 MG tablet TAKE 1 TABLET EVERY DAY    . Fluticasone-Salmeterol (ADVAIR) 250-50 MCG/DOSE AEPB Inhale 1 puff into the lungs 2 (two) times daily.    . furosemide (LASIX) 20 MG tablet TAKE 1 TABLET TWICE DAILY AS NEEDED  FOR  SWELLING 90 tablet 3  . levothyroxine (SYNTHROID, LEVOTHROID) 25 MCG tablet TAKE 1 AND 1/2 TABLETS ONE TIME DAILY Take on an empty stomach with a glass of water at least 30-60 minutes before breakfast.    . metoprolol succinate (TOPROL-XL) 50 MG 24 hr tablet TAKE 1/2 TABLET EVERY DAY 45 tablet 3  .  Multiple Vitamins-Minerals (OCUVITE PO) Take 2 tablets by mouth daily.    . naproxen (NAPROSYN) 500 MG tablet Take 500 mg by mouth 2 (two) times daily with a meal.    . OXYGEN Inhale into the lungs Nightly.    . pravastatin (PRAVACHOL) 40 MG tablet TAKE ONE TABLET BY MOUTH EVERY NIGHT AT BEDTIME 30 tablet 12  . ranitidine (ZANTAC) 300 MG tablet TAKE 1 TABLET AT BEDTIME 90 tablet 3  . roflumilast (DALIRESP) 500 MCG TABS tablet Take 500 mcg by mouth daily.    Marland Kitchen tiotropium (SPIRIVA) 18 MCG inhalation capsule Place 18 mcg into inhaler and inhale daily.     No current facility-administered medications for this visit.     Review of Systems Review of Systems  Constitutional: Negative.   Respiratory: Negative.   Cardiovascular: Negative.     Blood pressure 130/74, pulse 68, resp. rate 14, height 5\' 1"  (1.549 m), weight 158 lb  (71.7 kg).  Physical Exam Physical Exam  Constitutional: She is oriented to person, place, and time. She appears well-developed and well-nourished.  Eyes: Conjunctivae are normal. No scleral icterus.  Neck: Neck supple.  Cardiovascular: Normal rate, regular rhythm and normal heart sounds.   Pulmonary/Chest: Effort normal. She has wheezes (L side > R side). Right breast exhibits no inverted nipple, no mass, no nipple discharge, no skin change and no tenderness. Left breast exhibits no inverted nipple, no mass, no nipple discharge, no skin change and no tenderness.  Abdominal: Soft. Normal appearance. There is no tenderness.  Lymphadenopathy:    She has no cervical adenopathy.    She has no axillary adenopathy.  Neurological: She is alert and oriented to person, place, and time.  Skin: Skin is warm and dry.  Psychiatric: Her behavior is normal.    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  Patient has known COPD. Denies SOB. Being followed by pulmonology.    Plan    Patient will be asked to return to the office in one year with a bilateral screening mammogram.     This information has been scribed by Karie Fetch RN, BSN,BC.   SANKAR,SEEPLAPUTHUR G 07/06/2016, 10:24 AM

## 2016-07-06 NOTE — Patient Instructions (Addendum)
The patient is aware to call back for any questions or concerns. Patient will be asked to return to the office in one year with a bilateral screening mammogram. 

## 2016-07-07 ENCOUNTER — Encounter: Payer: Self-pay | Admitting: *Deleted

## 2016-07-19 ENCOUNTER — Other Ambulatory Visit: Payer: Self-pay | Admitting: Family Medicine

## 2016-08-01 DIAGNOSIS — J449 Chronic obstructive pulmonary disease, unspecified: Secondary | ICD-10-CM | POA: Diagnosis not present

## 2016-08-04 ENCOUNTER — Other Ambulatory Visit: Payer: Self-pay | Admitting: Family Medicine

## 2016-08-08 ENCOUNTER — Telehealth: Payer: Self-pay | Admitting: Family Medicine

## 2016-08-08 ENCOUNTER — Ambulatory Visit (INDEPENDENT_AMBULATORY_CARE_PROVIDER_SITE_OTHER): Payer: Commercial Managed Care - HMO

## 2016-08-08 ENCOUNTER — Ambulatory Visit (INDEPENDENT_AMBULATORY_CARE_PROVIDER_SITE_OTHER): Payer: Commercial Managed Care - HMO | Admitting: Podiatry

## 2016-08-08 VITALS — BP 143/111 | HR 58 | Resp 16

## 2016-08-08 DIAGNOSIS — S92354A Nondisplaced fracture of fifth metatarsal bone, right foot, initial encounter for closed fracture: Secondary | ICD-10-CM | POA: Diagnosis not present

## 2016-08-08 DIAGNOSIS — R600 Localized edema: Secondary | ICD-10-CM | POA: Diagnosis not present

## 2016-08-08 DIAGNOSIS — R52 Pain, unspecified: Secondary | ICD-10-CM | POA: Diagnosis not present

## 2016-08-08 NOTE — Telephone Encounter (Signed)
Called pt back to discuss her request for referral. LMOVM for pt to return call.

## 2016-08-08 NOTE — Telephone Encounter (Signed)
Pt stated that she is at [facility] and she didn't know she needed a referral. Pt would like a referral to see Dr. Amalia Hailey. Pt was advised Dr. Caryn Section is out of the office. Please advise. Thanks TNP

## 2016-08-08 NOTE — Progress Notes (Signed)
Subjective:  Patient presents today for right foot pain. Patient states that on 08/05/2016 she fell outside of her home. Patient states that the following morning she could hear cracking and she believes that she broke her right foot. Patient presents today for further treatment and evaluation Patient is wearing her friends cam boot that she received from her friend. She states is comfortable and it alleviates pain.    Objective/Physical Exam General: The patient is alert and oriented x3 in no acute distress.  Dermatology: Skin is warm, dry and supple bilateral lower extremities. Negative for open lesions or macerations.  Vascular: Moderate edema noted to the dorsal aspect patient's right foot. Palpable pedal pulses bilaterally. Capillary refill within normal limits.  Neurological: Epicritic and protective threshold grossly intact bilaterally.   Musculoskeletal Exam: Range of motion within normal limits to all pedal and ankle joints bilateral. Muscle strength 5/5 in all groups bilateral.   Radiographic Exam:  Nondisplaced, closed spiral oblique fracture of the fifth metatarsal right foot at the neck of the metatarsal bone.  Assessment: #1 fracture fifth metatarsal right foot-closed, nondisplaced #2 pain in right foot #3 edema right foot   Plan of Care:  #1 Patient was evaluated. #2 compression anklet dispensed today for edema #3 continue wearing cam boot #4 strict nonweightbearing right lower extremity #5 return to clinic in 4 weeks for follow-up x-rays   Edrick Kins, DPM [facility]  Dr. Edrick Kins, [address]                                        [place], [place] [address]                Office [phone]  Fax [phone]

## 2016-08-17 NOTE — Telephone Encounter (Signed)
LMOVM for pt to return call 

## 2016-08-21 NOTE — Telephone Encounter (Signed)
Please advise referral?  

## 2016-08-21 NOTE — Telephone Encounter (Signed)
Spoke with patient. She states referrals are not needed anymore and that she called and talked to someone about this.-aa

## 2016-08-21 NOTE — Telephone Encounter (Signed)
LMOVM for pt to return call 

## 2016-08-21 NOTE — Telephone Encounter (Signed)
Every referral requires a medicaljustification. Please always ask the medical reason when patients call for referral. Thanks.

## 2016-09-01 DIAGNOSIS — J449 Chronic obstructive pulmonary disease, unspecified: Secondary | ICD-10-CM | POA: Diagnosis not present

## 2016-09-05 ENCOUNTER — Ambulatory Visit (INDEPENDENT_AMBULATORY_CARE_PROVIDER_SITE_OTHER): Payer: Commercial Managed Care - HMO | Admitting: Podiatry

## 2016-09-05 ENCOUNTER — Ambulatory Visit (INDEPENDENT_AMBULATORY_CARE_PROVIDER_SITE_OTHER): Payer: Commercial Managed Care - HMO

## 2016-09-05 DIAGNOSIS — S92354A Nondisplaced fracture of fifth metatarsal bone, right foot, initial encounter for closed fracture: Secondary | ICD-10-CM

## 2016-09-05 DIAGNOSIS — S92351D Displaced fracture of fifth metatarsal bone, right foot, subsequent encounter for fracture with routine healing: Secondary | ICD-10-CM | POA: Diagnosis not present

## 2016-09-16 NOTE — Progress Notes (Signed)
Subjective:  Patient presents today for follow-up evaluation of a fracture of the fifth metatarsal right foot.. Patient states that on 08/05/2016 she fell outside of her home. Patient states that the following morning she could hear cracking and she believes that she broke her right foot. Patient states that she's feeling better. She denies pain.   Objective/Physical Exam General: The patient is alert and oriented x3 in no acute distress.  Dermatology: Skin is warm, dry and supple bilateral lower extremities. Negative for open lesions or macerations.  Vascular: Moderate edema noted to the dorsal aspect patient's right foot. Palpable pedal pulses bilaterally. Capillary refill within normal limits.  Neurological: Epicritic and protective threshold grossly intact bilaterally.   Musculoskeletal Exam: Range of motion within normal limits to all pedal and ankle joints bilateral. Muscle strength 5/5 in all groups bilateral.   Radiographic Exam:  Nondisplaced, closed spiral oblique fracture of the fifth metatarsal right foot at the neck of the metatarsal bone.  Assessment: #1 fracture fifth metatarsal right foot-closed, nondisplaced #2 pain in right foot #3 edema right foot   Plan of Care:  #1 Patient was evaluated. Follow-up x-rays are reviewed today #2 continue compression anklet #3 continue wearing cam boot #4 continue nonweightbearing right lower extremity for an additional 4 weeks #5 return to clinic in 4 weeks  Edrick Kins, DPM [facility]  Dr. Edrick Kins, [address]                                        [place], [place] [address]                Office [phone]  Fax [phone]

## 2016-10-02 DIAGNOSIS — J449 Chronic obstructive pulmonary disease, unspecified: Secondary | ICD-10-CM | POA: Diagnosis not present

## 2016-10-03 ENCOUNTER — Ambulatory Visit (INDEPENDENT_AMBULATORY_CARE_PROVIDER_SITE_OTHER): Payer: Medicare HMO

## 2016-10-03 ENCOUNTER — Ambulatory Visit (INDEPENDENT_AMBULATORY_CARE_PROVIDER_SITE_OTHER): Payer: Medicare HMO | Admitting: Podiatry

## 2016-10-03 ENCOUNTER — Encounter: Payer: Self-pay | Admitting: Podiatry

## 2016-10-03 DIAGNOSIS — S92351D Displaced fracture of fifth metatarsal bone, right foot, subsequent encounter for fracture with routine healing: Secondary | ICD-10-CM | POA: Diagnosis not present

## 2016-10-04 ENCOUNTER — Telehealth: Payer: Self-pay | Admitting: Family Medicine

## 2016-10-04 NOTE — Telephone Encounter (Signed)
Called Pt to schedule AWV with [facility] - knb °

## 2016-10-06 ENCOUNTER — Other Ambulatory Visit: Payer: Self-pay | Admitting: Family Medicine

## 2016-10-11 NOTE — Telephone Encounter (Signed)
2nd call left msg w/husband

## 2016-10-13 NOTE — Progress Notes (Signed)
Subjective:  Patient presents today for follow-up evaluation of a fracture of the fifth metatarsal right foot.. Patient states that on 08/05/2016 she fell outside of her home. Patient states that she's feeling much better today and denies pain. Patient has been wearing the cam boot for the last 8 weeks. Today the fractures approximately 8 weeks old.   Objective/Physical Exam General: The patient is alert and oriented x3 in no acute distress.  Dermatology: Skin is warm, dry and supple bilateral lower extremities. Negative for open lesions or macerations.  Vascular: Moderate edema noted to the dorsal aspect patient's right foot. Palpable pedal pulses bilaterally. Capillary refill within normal limits.  Neurological: Epicritic and protective threshold grossly intact bilaterally.   Musculoskeletal Exam: Range of motion within normal limits to all pedal and ankle joints bilateral. Muscle strength 5/5 in all groups bilateral.   Radiographic Exam:  Nondisplaced, closed spiral oblique fracture of the fifth metatarsal right foot at the neck of the metatarsal bone. There is evidence of bony callus formation. Today the fracture the fifth metatarsal demonstrates routine healing.  Assessment: #1 fracture fifth metatarsal right foot-closed, nondisplaced, with routine healing   Plan of Care:  #1 Patient was evaluated. Follow-up x-rays are reviewed today #2 continue compression anklet #3 today we can discontinue the cam boot and the patient will transition into a postoperative shoe. (Partial weightbearing right lower extremity.  #4 Return to clinic in 4 weeks  Edrick Kins, DPM [facility]  Dr. Edrick Kins, [address]                                        [place], [place] [address]                Office [phone]  Fax [phone]

## 2016-10-17 DIAGNOSIS — J449 Chronic obstructive pulmonary disease, unspecified: Secondary | ICD-10-CM | POA: Diagnosis not present

## 2016-10-17 DIAGNOSIS — R0902 Hypoxemia: Secondary | ICD-10-CM | POA: Diagnosis not present

## 2016-10-17 DIAGNOSIS — R0609 Other forms of dyspnea: Secondary | ICD-10-CM | POA: Diagnosis not present

## 2016-10-30 DIAGNOSIS — J449 Chronic obstructive pulmonary disease, unspecified: Secondary | ICD-10-CM | POA: Diagnosis not present

## 2016-10-31 ENCOUNTER — Encounter: Payer: Self-pay | Admitting: Podiatry

## 2016-10-31 ENCOUNTER — Ambulatory Visit (INDEPENDENT_AMBULATORY_CARE_PROVIDER_SITE_OTHER): Payer: Medicare HMO | Admitting: Podiatry

## 2016-10-31 DIAGNOSIS — M779 Enthesopathy, unspecified: Principal | ICD-10-CM

## 2016-10-31 DIAGNOSIS — M7751 Other enthesopathy of right foot: Secondary | ICD-10-CM

## 2016-10-31 DIAGNOSIS — S92351D Displaced fracture of fifth metatarsal bone, right foot, subsequent encounter for fracture with routine healing: Secondary | ICD-10-CM | POA: Diagnosis not present

## 2016-10-31 DIAGNOSIS — M778 Other enthesopathies, not elsewhere classified: Secondary | ICD-10-CM

## 2016-10-31 MED ORDER — BETAMETHASONE SOD PHOS & ACET 6 (3-3) MG/ML IJ SUSP: 3.0000 mg | Freq: Once | INTRAMUSCULAR | Status: DC

## 2016-10-31 NOTE — Progress Notes (Signed)
Subjective:  Patient presents today for follow-up evaluation of a fracture of the fifth metatarsal right foot.. Patient states that on 08/05/2016 she fell outside of her home. Patient states that she's feeling much better today and denies pain.    Objective/Physical Exam General: The patient is alert and oriented x3 in no acute distress.  Dermatology: Skin is warm, dry and supple bilateral lower extremities. Negative for open lesions or macerations.  Vascular: Moderate edema noted to the dorsal aspect patient's right foot. Palpable pedal pulses bilaterally. Capillary refill within normal limits.  Neurological: Epicritic and protective threshold grossly intact bilaterally.   Musculoskeletal Exam: Range of motion within normal limits to all pedal and ankle joints bilateral. Muscle strength 5/5 in all groups bilateral.  Minimal pain on palpation noted to the fifth metatarsal area.  Assessment: #1 fracture fifth metatarsal right foot-closed, nondisplaced, with routine healing #2 tarsometatarsal joint capsulitis right foot   Plan of Care:  #1 Patient was evaluated. Patient's fracture has healed based on clinical exam #2 injection of 0.5 mL [place] Soluspan injected into the fifth metatarsal cuboid joint of the right foot #3 patient can discontinue wearing the cam boot #4 return to clinic when necessary  Edrick Kins, DPM [facility]  Dr. Edrick Kins, [address]                                        [place], [place] [address]                Office [phone]  Fax [phone]

## 2016-11-22 ENCOUNTER — Other Ambulatory Visit: Payer: Self-pay | Admitting: Family Medicine

## 2016-11-30 DIAGNOSIS — J449 Chronic obstructive pulmonary disease, unspecified: Secondary | ICD-10-CM | POA: Diagnosis not present

## 2016-12-01 DIAGNOSIS — J449 Chronic obstructive pulmonary disease, unspecified: Secondary | ICD-10-CM | POA: Diagnosis not present

## 2016-12-01 DIAGNOSIS — E782 Mixed hyperlipidemia: Secondary | ICD-10-CM | POA: Diagnosis not present

## 2016-12-01 DIAGNOSIS — G250 Essential tremor: Secondary | ICD-10-CM | POA: Diagnosis not present

## 2016-12-01 DIAGNOSIS — R0602 Shortness of breath: Secondary | ICD-10-CM | POA: Diagnosis not present

## 2016-12-06 ENCOUNTER — Ambulatory Visit (INDEPENDENT_AMBULATORY_CARE_PROVIDER_SITE_OTHER): Payer: Medicare HMO

## 2016-12-06 ENCOUNTER — Encounter: Payer: Self-pay | Admitting: Family Medicine

## 2016-12-06 ENCOUNTER — Ambulatory Visit (INDEPENDENT_AMBULATORY_CARE_PROVIDER_SITE_OTHER): Payer: Medicare HMO | Admitting: Family Medicine

## 2016-12-06 VITALS — BP 136/76 | HR 64 | Temp 98.1°F | Ht 61.0 in | Wt 165.0 lb

## 2016-12-06 VITALS — BP 136/76 | HR 64 | Temp 98.1°F | Resp 16 | Wt 165.0 lb

## 2016-12-06 DIAGNOSIS — M810 Age-related osteoporosis without current pathological fracture: Secondary | ICD-10-CM | POA: Diagnosis not present

## 2016-12-06 DIAGNOSIS — Z0000 Encounter for general adult medical examination without abnormal findings: Secondary | ICD-10-CM | POA: Diagnosis not present

## 2016-12-06 DIAGNOSIS — G4710 Hypersomnia, unspecified: Secondary | ICD-10-CM

## 2016-12-06 DIAGNOSIS — I10 Essential (primary) hypertension: Secondary | ICD-10-CM

## 2016-12-06 DIAGNOSIS — R7303 Prediabetes: Secondary | ICD-10-CM

## 2016-12-06 DIAGNOSIS — E785 Hyperlipidemia, unspecified: Secondary | ICD-10-CM

## 2016-12-06 NOTE — Progress Notes (Signed)
Subjective:   Toni White is a 81 y.o. female who presents for Medicare Annual (Subsequent) preventive examination.  Review of Systems:  N/A  Cardiac Risk Factors include: advanced age (>70men, >42 women);dyslipidemia;hypertension;obesity (BMI >30kg/m2)     Objective:     Vitals: BP 136/76 (BP Location: Right Arm)   Pulse 64   Temp 98.1 F (36.7 C) (Oral)   Ht 5\' 1"  (1.549 m)   Wt 165 lb (74.8 kg)   BMI 31.18 kg/m   Body mass index is 31.18 kg/m.   Tobacco History  Smoking Status  . Former Smoker  . Packs/day: 1.00  . Years: 30.00  . Types: Cigarettes  Smokeless Tobacco  . Never Used    Comment: quit around 1989     Counseling given: Not Answered   Past Medical History:  Diagnosis Date  . History of chicken pox   . Hypertension   . Ulcer    Past Surgical History:  Procedure Laterality Date  . ABDOMINAL HYSTERECTOMY  2003   with BSO due to bleeding  . APPENDECTOMY    . BREAST BIOPSY Left 2005   stereo breast biopsy  . CHOLECYSTECTOMY    . COLONOSCOPY  2003   Dr. Vira Agar. Hyperplastic polyps; Single polyp excision from rectum  . CT Scan of Brain  08/20/2013   Mild diffuse cortical atrophy. Mid chronic ischemic white matter disease. 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 Spur  . INCISION AND DRAINAGE BREAST ABSCESS Left 1990  . TONSILLECTOMY  1952  . UPPER GI ENDOSCOPY  02/26/2002   Dr. Tiffany Kocher; Gastritis. Single gastric ectasia   Family History  Problem Relation Age of Onset  . Heart disease Father   . Congestive Heart Failure Father   . Heart disease Mother   . Congestive Heart Failure Mother   . Cancer Brother     lung  . Lung cancer Brother    History  Sexual Activity  . Sexual activity: Not on file    Outpatient Encounter Prescriptions as of 12/06/2016  Medication Sig  . albuterol (VENTOLIN HFA) 108 (90 BASE) MCG/ACT inhaler Inhale 2 puffs into the lungs. Every 4-6 hours as needed  .  amLODipine (NORVASC) 2.5 MG tablet TAKE 1 TABLET EVERY DAY  . aspirin 81 MG tablet Take 81 mg by mouth daily.  . Calcium-Magnesium-Vitamin D (CALCIUM 500 PO) Take 1 tablet by mouth 2 (two) times daily.  . citalopram (CELEXA) 20 MG tablet TAKE 1 TABLET EVERY DAY  . Fluticasone-Salmeterol (ADVAIR) 250-50 MCG/DOSE AEPB Inhale 1 puff into the lungs 2 (two) times daily.  . furosemide (LASIX) 20 MG tablet TAKE 1 TABLET TWICE DAILY AS NEEDED  FOR  SWELLING  . gabapentin (NEURONTIN) 100 MG capsule Take 1 capsule by mouth 2 (two) times daily.  Marland Kitchen levothyroxine (SYNTHROID, LEVOTHROID) 25 MCG tablet TAKE 1 AND 1/2 TABLETS ONE TIME DAILY Take on an empty stomach with a glass of water at least 30-60 minutes before breakfast.  . metoprolol succinate (TOPROL-XL) 50 MG 24 hr tablet TAKE 1/2 TABLET EVERY DAY  . Multiple Vitamins-Minerals (OCUVITE PO) Take 2 tablets by mouth daily.  . naproxen (NAPROSYN) 500 MG tablet Take 500 mg by mouth 2 (two) times daily with a meal.  . OXYGEN Inhale into the lungs Nightly.  . pravastatin (PRAVACHOL) 40 MG tablet TAKE ONE TABLET BY MOUTH EVERY NIGHT AT BEDTIME  . ranitidine (ZANTAC) 300 MG tablet TAKE 1 TABLET AT BEDTIME  .  roflumilast (DALIRESP) 500 MCG TABS tablet Take 500 mcg by mouth daily.  Marland Kitchen tiotropium (SPIRIVA) 18 MCG inhalation capsule Place 18 mcg into inhaler and inhale daily.   Facility-Administered Encounter Medications as of 12/06/2016  Medication  . betamethasone acetate-betamethasone sodium phosphate (CELESTONE) injection 3 mg    Activities of Daily Living In your present state of health, do you have any difficulty performing the following activities: 12/06/2016 12/06/2016  Hearing? [name]  Vision? Y Y  Difficulty concentrating or making decisions? N N  Walking or climbing stairs? Y Y  Dressing or bathing? N N  Doing errands, shopping? N N  Preparing Food and eating ? N -  Using the Toilet? N -  In the past six months, have you accidently leaked urine? Y -    Do you have problems with loss of bowel control? N -  Managing your Medications? N -  Managing your Finances? N -  Housekeeping or managing your Housekeeping? N -  Some recent data might be hidden    Patient Care Team: Birdie Sons, MD as PCP - General (Family Medicine) Seeplaputhur Robinette Haines, MD (General Surgery) Erby Pian, MD as Referring Physician (Specialist) Teodoro Spray, MD as Consulting Physician (Cardiology) Anabel Bene, MD as Referring Physician (Neurology) Estill Cotta, MD as Consulting Physician (Ophthalmology) Kennieth Francois, MD as Consulting Physician (Dermatology)    Assessment:     Exercise Activities and Dietary recommendations Current Exercise Habits: The patient does not participate in regular exercise at present, Exercise limited by: None identified  Goals    . Increase water intake          Recommend increasing water intake to 3 glasses a day.      Fall Risk Fall Risk  12/06/2016 12/06/2016 06/09/2015  Falls in the past year? Yes Yes No  Number falls in past yr: 1 1 -  Injury with Fall? Yes Yes -  Follow up Falls prevention discussed Falls prevention discussed -   Depression Screen PHQ 2/9 Scores 12/06/2016 12/06/2016 06/09/2015  PHQ - 2 Score 0 0 0  PHQ- 9 Score - 2 -     Cognitive Function     6CIT Screen 12/06/2016  What Year? 0 points  What month? 0 points  What time? 0 points  Count back from 20 0 points  Months in reverse 0 points  Repeat phrase 2 points  Total Score 2    Immunization History  Administered Date(s) Administered  . Influenza, High Dose Seasonal PF 04/29/2016  . Influenza,inj,Quad PF,36+ Mos 06/01/2015  . Pneumococcal Conjugate-13 01/14/2014  . Pneumococcal Polysaccharide-23 03/13/2006, 06/29/2007  . Tdap 04/22/2011  . Zoster 08/14/2006   Screening Tests Health Maintenance  Topic Date Due  . HEMOGLOBIN A1C  12/06/2016  . URINE MICROALBUMIN  01/05/2017 (Originally 04/11/1946)  . INFLUENZA VACCINE   03/07/2017  . OPHTHALMOLOGY EXAM  07/06/2017  . FOOT EXAM  10/03/2017  . TETANUS/TDAP  04/21/2021  . DEXA SCAN  Completed  . PNA vac Low Risk Adult  Completed      Plan:  I have personally reviewed and addressed the Medicare Annual Wellness questionnaire and have noted the following in the patient's chart:  A. Medical and social history B. Use of alcohol, tobacco or illicit drugs  C. Current medications and supplements D. Functional ability and status E.  Nutritional status F.  Physical activity G. Advance directives H. List of other physicians I.  Hospitalizations, surgeries, and ER visits in previous 95  months J.  Vitals K. Screenings such as hearing and vision if needed, cognitive and depression L. Referrals and appointments - none  In addition, I have reviewed and discussed with patient certain preventive protocols, quality metrics, and best practice recommendations. A written personalized care plan for preventive services as well as general preventive health recommendations were provided to patient.  See attached scanned questionnaire for additional information.   Signed,  Fabio Neighbors, LPN Nurse Health Advisor   MD Recommendations: Need urine microalbumin at next OV.   I have reviewed the health advisor's note, was available for consultation, and agree with documentation and plan  Lelon Huh, MD

## 2016-12-06 NOTE — Progress Notes (Signed)
Patient: Toni White Female    DOB: 23-Jun-1936   81 y.o.   MRN: [id number] Visit Date: 12/06/2016  Today's Provider: Lelon Huh, MD   Chief Complaint  Patient presents with  . Hypertension  . COPD  . Hyperlipidemia  . Hyperglycemia   Subjective:    HPI  Hypertension, follow-up:  BP Readings from Last 3 Encounters:  08/08/16 (!) 143/111  07/06/16 130/74  06/08/16 104/64    She was last seen for hypertension 6 months ago.  BP at that visit was 104/64. Management since that visit includes no changes. She reports good compliance with treatment. She is not having side effects.  She is not exercising. She is adherent to low salt diet.   Outside blood pressures are checked at home and are normal per patient report. She is experiencing none.  Patient denies chest pain, chest pressure/discomfort, claudication, dyspnea, exertional chest pressure/discomfort, fatigue, irregular heart beat, lower extremity edema, near-syncope, orthopnea, palpitations, paroxysmal nocturnal dyspnea, syncope and tachypnea.   Cardiovascular risk factors include advanced age (older than 68 for men, 27 for women), dyslipidemia and hypertension.  Use of agents associated with hypertension: NSAIDS.     Weight trend: increasing steadily Wt Readings from Last 3 Encounters:  07/06/16 158 lb (71.7 kg)  06/08/16 149 lb (67.6 kg)  12/07/15 164 lb (74.4 kg)    Current diet: in general, an "unhealthy" diet  ------------------------------------------------------------------------ Follow up of COPD:  Patient was last seen for this problem 6 months ago and no changes were made. Patient reports good compliance with treatment and good symptom control. Patient uses night time oxygen. Continues regular follow up with Dr. [name]   Lipid/Cholesterol, Follow-up:   Last seen for this1 years ago.  Management changes since that visit include none. . Last Lipid Panel:    Component Value Date/Time   CHOL  153 12/07/2015 0846   TRIG 161 (H) 12/07/2015 0846   HDL 44 12/07/2015 0846   CHOLHDL 3.5 12/07/2015 0846   LDLCALC 77 12/07/2015 0846    Risk factors for vascular disease include hypercholesterolemia and hypertension  She reports good compliance with treatment. She is not having side effects.  Current symptoms include none and have been stable. Weight trend: increasing steadily Prior visit with dietician: no Current diet: in general, an "unhealthy" diet Current exercise: none  Wt Readings from Last 3 Encounters:  07/06/16 158 lb (71.7 kg)  06/08/16 149 lb (67.6 kg)  12/07/15 164 lb (74.4 kg)    -------------------------------------------------------------------  Prediabetes, Follow-up:   Lab Results  Component Value Date   HGBA1C 5.9 06/08/2016   HGBA1C 6.0 (H) 12/07/2015   HGBA1C 5.5 06/09/2015   GLUCOSE 108 (H) 12/07/2015   GLUCOSE 139 (H) 01/27/2014   GLUCOSE 122 (H) 01/26/2014    Last seen for for this6 months ago.  Management since that visit includes no changes. Current symptoms include none and have been stable.  Weight trend: increasing steadily Prior visit with dietician: no Current diet: in general, an "unhealthy" diet Current exercise: none  Pertinent Labs:    Component Value Date/Time   CHOL 153 12/07/2015 0846   TRIG 161 (H) 12/07/2015 0846   CHOLHDL 3.5 12/07/2015 0846   CREATININE 0.98 12/07/2015 0846   CREATININE 0.83 01/27/2014 0551    Wt Readings from Last 3 Encounters:  07/06/16 158 lb (71.7 kg)  06/08/16 149 lb (67.6 kg)  12/07/15 164 lb (74.4 kg)       Allergies  Allergen  Reactions  . Flexeril  [Cyclobenzaprine Hcl]     Confusion  . Reglan [Metoclopramide] Hives  . Relafen [Nabumetone] Hives  . Risperdal  [Risperidone]     Confusion Rash  . Sulfa Antibiotics Hives  . Tape Other (See Comments)    Per patient pulls her skin off.  . Triamterene-Hctz     Hypercalcemia  . Hctz [Hydrochlorothiazide] Palpitations      Current Outpatient Prescriptions:  .  albuterol (VENTOLIN HFA) 108 (90 BASE) MCG/ACT inhaler, Inhale 2 puffs into the lungs. Every 4-6 hours as needed, Disp: , Rfl:  .  amLODipine (NORVASC) 2.5 MG tablet, TAKE 1 TABLET EVERY DAY, Disp: 90 tablet, Rfl: 4 .  aspirin 81 MG tablet, Take 81 mg by mouth daily., Disp: , Rfl:  .  Calcium-Magnesium-Vitamin D (CALCIUM 500 PO), Take 1 tablet by mouth 2 (two) times daily., Disp: , Rfl:  .  citalopram (CELEXA) 20 MG tablet, TAKE 1 TABLET EVERY DAY, Disp: , Rfl:  .  Fluticasone-Salmeterol (ADVAIR) 250-50 MCG/DOSE AEPB, Inhale 1 puff into the lungs 2 (two) times daily., Disp: , Rfl:  .  furosemide (LASIX) 20 MG tablet, TAKE 1 TABLET TWICE DAILY AS NEEDED  FOR  SWELLING, Disp: 90 tablet, Rfl: 3 .  gabapentin (NEURONTIN) 100 MG capsule, Take 1 capsule by mouth 2 (two) times daily., Disp: , Rfl:  .  levothyroxine (SYNTHROID, LEVOTHROID) 25 MCG tablet, TAKE 1 AND 1/2 TABLETS ONE TIME DAILY Take on an empty stomach with a glass of water at least 30-60 minutes before breakfast., Disp: , Rfl:  .  metoprolol succinate (TOPROL-XL) 50 MG 24 hr tablet, TAKE 1/2 TABLET EVERY DAY, Disp: 45 tablet, Rfl: 5 .  Multiple Vitamins-Minerals (OCUVITE PO), Take 2 tablets by mouth daily., Disp: , Rfl:  .  naproxen (NAPROSYN) 500 MG tablet, Take 500 mg by mouth 2 (two) times daily with a meal., Disp: , Rfl:  .  OXYGEN, Inhale into the lungs Nightly., Disp: , Rfl:  .  pravastatin (PRAVACHOL) 40 MG tablet, TAKE ONE TABLET BY MOUTH EVERY NIGHT AT BEDTIME, Disp: 30 tablet, Rfl: 12 .  prednisoLONE acetate (PRED FORTE) 1 % ophthalmic suspension, Place 1 drop into both eyes every other day., Disp: , Rfl:  .  ranitidine (ZANTAC) 300 MG tablet, TAKE 1 TABLET AT BEDTIME, Disp: 90 tablet, Rfl: 3 .  roflumilast (DALIRESP) 500 MCG TABS tablet, Take 500 mcg by mouth daily., Disp: , Rfl:  .  tiotropium (SPIRIVA) 18 MCG inhalation capsule, Place 18 mcg into inhaler and inhale daily., Disp: ,  Rfl:   Current Facility-Administered Medications:  .  betamethasone acetate-betamethasone sodium phosphate (CELESTONE) injection 3 mg, 3 mg, Intramuscular, Once, Edrick Kins, DPM  Review of Systems  Constitutional: Negative for appetite change, chills, fatigue and fever.  Respiratory: Negative for chest tightness and shortness of breath.   Cardiovascular: Negative for chest pain and palpitations.  Gastrointestinal: Negative for abdominal pain, nausea and vomiting.  Neurological: Negative for dizziness and weakness.    Social History  Substance Use Topics  . Smoking status: Former Smoker    Packs/day: 1.00    Years: 30.00    Types: Cigarettes  . Smokeless tobacco: Never Used     Comment: quit around 1989  . Alcohol use No   Objective:   BP 136/76 (BP Location: Left Arm, Patient Position: Sitting, Cuff Size: Large)   Pulse 64   Temp 98.1 F (36.7 C) (Oral)   Resp 16   Wt 165 lb (74.8  kg)   SpO2 96% Comment: room air  BMI 31.18 kg/m  There were no vitals filed for this visit.   Physical Exam   General Appearance:    Alert, cooperative, no distress  Eyes:    PERRL, conjunctiva/corneas clear, EOM's intact       Lungs:     Clear to auscultation bilaterally, respirations unlabored  Heart:    Regular rate and rhythm  Neurologic:   Awake, alert, oriented x 3. No apparent focal neurological           defect.           Assessment & Plan:     1. Essential (primary) hypertension Well controlled.  Continue current medications.    2. Hyperlipidemia, unspecified hyperlipidemia type She is tolerating pravastatin well with no adverse effects.  Wants 90 day to mail order if no changes are made - Lipid panel - Comprehensive metabolic panel  3. Prediabetes  - Hemoglobin A1c  4. Osteoporosis, unspecified osteoporosis type, unspecified pathological fracture presence Due for follow up BMD - DG Bone Density; Future  5. Hypersomnolence Improved. Continue follow up with Dr.  Melrose Nakayama.        Lelon Huh, MD  [facility]  The entirety of the information documented in the History of Present Illness, Review of Systems and Physical Exam were personally obtained by me. Portions of this information were initially documented by Meyer Cory, CMA and reviewed by me for thoroughness and accuracy.

## 2016-12-06 NOTE — Patient Instructions (Addendum)
Toni White , Thank you for taking time to come for your Medicare Wellness Visit. I appreciate your ongoing commitment to your health goals. Please review the following plan we discussed and let me know if I can assist you in the future.   Screening recommendations/referrals: Colonoscopy: completed 02/26/02  Mammogram: completed 06/28/16, due 06/2017 Bone Density: completed 05/19/14, future order placed today Recommended yearly ophthalmology/optometry visit for glaucoma screening and checkup Recommended yearly dental visit for hygiene and checkup  Vaccinations: Influenza vaccine: up to date, due 04/2017 Pneumococcal vaccine: completed series Tdap vaccine: completed 04/22/2011, due 04/2021 Shingles vaccine: completed 08/14/2006    Advanced directives: Please bring a copy of your POA (Power of [place]) and/or Living Will to your next appointment.   Conditions/risks identified: Fall risk prevention and recommend increasing water intake to 3 glasses a day.  Next appointment: None, need to schedule 1 year AWV.   Preventive Care 10 Years and Older, Female Preventive care refers to lifestyle choices and visits with your health care provider that can promote health and wellness. What does preventive care include?  A yearly physical exam. This is also called an annual well check.  Dental exams once or twice a year.  Routine eye exams. Ask your health care provider how often you should have your eyes checked.  Personal lifestyle choices, including:  Daily care of your teeth and gums.  Regular physical activity.  Eating a healthy diet.  Avoiding tobacco and drug use.  Limiting alcohol use.  Practicing safe sex.  Taking low-dose aspirin every day.  Taking vitamin and mineral supplements as recommended by your health care provider. What happens during an annual well check? The services and screenings done by your health care provider during your annual well check will depend on  your age, overall health, lifestyle risk factors, and family history of disease. Counseling  Your health care provider may ask you questions about your:  Alcohol use.  Tobacco use.  Drug use.  Emotional well-being.  Home and relationship well-being.  Sexual activity.  Eating habits.  History of falls.  Memory and ability to understand (cognition).  Work and work Statistician.  Reproductive health. Screening  You may have the following tests or measurements:  Height, weight, and BMI.  Blood pressure.  Lipid and cholesterol levels. These may be checked every 5 years, or more frequently if you are over 3 years old.  Skin check.  Lung cancer screening. You may have this screening every year starting at age 25 if you have a 30-pack-year history of smoking and currently smoke or have quit within the past 15 years.  Fecal occult blood test (FOBT) of the stool. You may have this test every year starting at age 12.  Flexible sigmoidoscopy or colonoscopy. You may have a sigmoidoscopy every 5 years or a colonoscopy every 10 years starting at age 40.  Hepatitis C blood test.  Hepatitis B blood test.  Sexually transmitted disease (STD) testing.  Diabetes screening. This is done by checking your blood sugar (glucose) after you have not eaten for a while (fasting). You may have this done every 1-3 years.  Bone density scan. This is done to screen for osteoporosis. You may have this done starting at age 20.  Mammogram. This may be done every 1-2 years. Talk to your health care provider about how often you should have regular mammograms. Talk with your health care provider about your test results, treatment options, and if necessary, the need for more tests. Vaccines  Your health care provider may recommend certain vaccines, such as:  Influenza vaccine. This is recommended every year.  Tetanus, diphtheria, and acellular pertussis (Tdap, Td) vaccine. You may need a Td booster  every 10 years.  Zoster vaccine. You may need this after age 31.  Pneumococcal 13-valent conjugate (PCV13) vaccine. One dose is recommended after age 47.  Pneumococcal polysaccharide (PPSV23) vaccine. One dose is recommended after age 60. Talk to your health care provider about which screenings and vaccines you need and how often you need them. This information is not intended to replace advice given to you by your health care provider. Make sure you discuss any questions you have with your health care provider. Document Released: 08/20/2015 Document Revised: 04/12/2016 Document Reviewed: 05/25/2015 Elsevier Interactive Patient Education  2017 [place] Prevention in the Home Falls can cause injuries. They can happen to people of all ages. There are many things you can do to make your home safe and to help prevent falls. What can I do on the outside of my home?  Regularly fix the edges of walkways and driveways and fix any cracks.  Remove anything that might make you trip as you walk through a door, such as a raised step or threshold.  Trim any bushes or trees on the path to your home.  Use bright outdoor lighting.  Clear any walking paths of anything that might make someone trip, such as rocks or tools.  Regularly check to see if handrails are loose or broken. Make sure that both sides of any steps have handrails.  Any raised decks and porches should have guardrails on the edges.  Have any leaves, snow, or ice cleared regularly.  Use sand or salt on walking paths during winter.  Clean up any spills in your garage right away. This includes oil or grease spills. What can I do in the bathroom?  Use night lights.  Install grab bars by the toilet and in the tub and shower. Do not use towel bars as grab bars.  Use non-skid mats or decals in the tub or shower.  If you need to sit down in the shower, use a plastic, non-slip stool.  Keep the floor dry. Clean up any  water that spills on the floor as soon as it happens.  Remove soap buildup in the tub or shower regularly.  Attach bath mats securely with double-sided non-slip rug tape.  Do not have throw rugs and other things on the floor that can make you trip. What can I do in the bedroom?  Use night lights.  Make sure that you have a light by your bed that is easy to reach.  Do not use any sheets or blankets that are too big for your bed. They should not hang down onto the floor.  Have a firm chair that has side arms. You can use this for support while you get dressed.  Do not have throw rugs and other things on the floor that can make you trip. What can I do in the kitchen?  Clean up any spills right away.  Avoid walking on wet floors.  Keep items that you use a lot in easy-to-reach places.  If you need to reach something above you, use a strong step stool that has a grab bar.  Keep electrical cords out of the way.  Do not use floor polish or wax that makes floors slippery. If you must use wax, use non-skid floor wax.  Do  not have throw rugs and other things on the floor that can make you trip. What can I do with my stairs?  Do not leave any items on the stairs.  Make sure that there are handrails on both sides of the stairs and use them. Fix handrails that are broken or loose. Make sure that handrails are as long as the stairways.  Check any carpeting to make sure that it is firmly attached to the stairs. Fix any carpet that is loose or worn.  Avoid having throw rugs at the top or bottom of the stairs. If you do have throw rugs, attach them to the floor with carpet tape.  Make sure that you have a light switch at the top of the stairs and the bottom of the stairs. If you do not have them, ask someone to add them for you. What else can I do to help prevent falls?  Wear shoes that:  Do not have high heels.  Have rubber bottoms.  Are comfortable and fit you well.  Are closed  at the toe. Do not wear sandals.  If you use a stepladder:  Make sure that it is fully opened. Do not climb a closed stepladder.  Make sure that both sides of the stepladder are locked into place.  Ask someone to hold it for you, if possible.  Clearly mark and make sure that you can see:  Any grab bars or handrails.  First and last steps.  Where the edge of each step is.  Use tools that help you move around (mobility aids) if they are needed. These include:  Canes.  Walkers.  Scooters.  Crutches.  Turn on the lights when you go into a dark area. Replace any light bulbs as soon as they burn out.  Set up your furniture so you have a clear path. Avoid moving your furniture around.  If any of your floors are uneven, fix them.  If there are any pets around you, be aware of where they are.  Review your medicines with your doctor. Some medicines can make you feel dizzy. This can increase your chance of falling. Ask your doctor what other things that you can do to help prevent falls. This information is not intended to replace advice given to you by your health care provider. Make sure you discuss any questions you have with your health care provider. Document Released: 05/20/2009 Document Revised: 12/30/2015 Document Reviewed: 08/28/2014 Elsevier Interactive Patient Education  2017 [employer].

## 2016-12-07 ENCOUNTER — Telehealth: Payer: Self-pay | Admitting: *Deleted

## 2016-12-07 DIAGNOSIS — E785 Hyperlipidemia, unspecified: Secondary | ICD-10-CM

## 2016-12-07 LAB — LIPID PANEL
Chol/HDL Ratio: 3.6 ratio (ref 0.0–4.4)
Cholesterol, Total: 161 mg/dL (ref 100–199)
HDL: 45 mg/dL (ref >39)
LDL Calculated: 85 mg/dL (ref 0–99)
TRIGLYCERIDES: 156 mg/dL — AB (ref 0–149)
VLDL Cholesterol Cal: 31 mg/dL (ref 5–40)

## 2016-12-07 LAB — COMPREHENSIVE METABOLIC PANEL
ALT: 20 IU/L (ref 0–32)
AST: 24 IU/L (ref 0–40)
Albumin/Globulin Ratio: 1.4 (ref 1.2–2.2)
Albumin: 4.1 g/dL (ref 3.5–4.8)
Alkaline Phosphatase: 72 IU/L (ref 39–117)
BUN/Creatinine Ratio: 20 (ref 12–28)
BUN: 19 mg/dL (ref 8–27)
Bilirubin Total: 0.4 mg/dL (ref 0.0–1.2)
CALCIUM: 10.4 mg/dL — AB (ref 8.7–10.3)
CO2: 30 mmol/L — AB (ref 18–29)
CREATININE: 0.93 mg/dL (ref 0.57–1.00)
Chloride: 97 mmol/L (ref 96–106)
GFR, EST AFRICAN AMERICAN: 68 mL/min/{1.73_m2} (ref >59)
GFR, EST NON AFRICAN AMERICAN: 59 mL/min/{1.73_m2} — AB (ref >59)
Globulin, Total: 3 g/dL (ref 1.5–4.5)
Glucose: 98 mg/dL (ref 65–99)
Potassium: 4.8 mmol/L (ref 3.5–5.2)
SODIUM: 142 mmol/L (ref 134–144)
TOTAL PROTEIN: 7.1 g/dL (ref 6.0–8.5)

## 2016-12-07 LAB — HEMOGLOBIN A1C
ESTIMATED AVERAGE GLUCOSE: 117 mg/dL
Hgb A1c MFr Bld: 5.7 % — ABNORMAL HIGH (ref 4.8–5.6)

## 2016-12-07 MED ORDER — PRAVASTATIN SODIUM 40 MG PO TABS: 40.0000 mg | ORAL_TABLET | Freq: Every day | ORAL | 4 refills | Status: DC

## 2016-12-07 NOTE — Telephone Encounter (Signed)
Patient was notified of results. Expressed understanding. Rx sent to pharmacy. 

## 2016-12-07 NOTE — Telephone Encounter (Signed)
-----   Message from Birdie Sons, MD sent at 12/07/2016  7:54 AM EDT ----- Average blood sugar is improved, at 117. Cholesterol is stable. Normal kidney functions. Continue current medications.  Can send in pravastatin refill #90 rf x 4.scheduled follow up 6 months.

## 2016-12-09 ENCOUNTER — Other Ambulatory Visit: Payer: Self-pay | Admitting: Family Medicine

## 2016-12-14 ENCOUNTER — Ambulatory Visit
Admission: RE | Admit: 2016-12-14 | Discharge: 2016-12-14 | Disposition: A | Discharge status: Home / Self Care | Payer: Commercial Managed Care - HMO | Source: Ambulatory Visit | Attending: Family Medicine | Admitting: Family Medicine

## 2016-12-14 ENCOUNTER — Telehealth: Payer: Self-pay | Admitting: Family Medicine

## 2016-12-14 DIAGNOSIS — M810 Age-related osteoporosis without current pathological fracture: Secondary | ICD-10-CM

## 2016-12-14 NOTE — Telephone Encounter (Signed)
Left detailed message on vm.

## 2016-12-14 NOTE — Telephone Encounter (Signed)
Toni White with [employer] wants to know if we have received a fax requestion a bone density test for this pt. She had a fx Aug 08 2016.  Medicare guidelines recommend a bone density test to be done by 7/18  [name]'s call back is [phone]  Thanks teri

## 2016-12-19 ENCOUNTER — Telehealth: Payer: Self-pay | Admitting: *Deleted

## 2016-12-19 DIAGNOSIS — M810 Age-related osteoporosis without current pathological fracture: Secondary | ICD-10-CM

## 2016-12-19 MED ORDER — ALENDRONATE SODIUM 70 MG PO TABS: 70.0000 mg | ORAL_TABLET | ORAL | 4 refills | Status: DC

## 2016-12-19 NOTE — Telephone Encounter (Signed)
-----   Message from Birdie Sons, MD sent at 12/14/2016 11:12 AM EDT ----- Bone density shows osteoporosis, slightly worse compared to 2015. Recommend alendronate 70 units once a week. Repeat BMD in 3 years.

## 2016-12-27 DIAGNOSIS — R0602 Shortness of breath: Secondary | ICD-10-CM | POA: Diagnosis not present

## 2016-12-30 DIAGNOSIS — J449 Chronic obstructive pulmonary disease, unspecified: Secondary | ICD-10-CM | POA: Diagnosis not present

## 2017-01-08 DIAGNOSIS — J449 Chronic obstructive pulmonary disease, unspecified: Secondary | ICD-10-CM | POA: Diagnosis not present

## 2017-01-08 DIAGNOSIS — G250 Essential tremor: Secondary | ICD-10-CM | POA: Diagnosis not present

## 2017-01-08 DIAGNOSIS — E782 Mixed hyperlipidemia: Secondary | ICD-10-CM | POA: Diagnosis not present

## 2017-01-08 DIAGNOSIS — R0609 Other forms of dyspnea: Secondary | ICD-10-CM | POA: Diagnosis not present

## 2017-01-17 DIAGNOSIS — R0609 Other forms of dyspnea: Secondary | ICD-10-CM | POA: Diagnosis not present

## 2017-01-22 DIAGNOSIS — R251 Tremor, unspecified: Secondary | ICD-10-CM | POA: Diagnosis not present

## 2017-01-22 DIAGNOSIS — H539 Unspecified visual disturbance: Secondary | ICD-10-CM | POA: Diagnosis not present

## 2017-01-22 DIAGNOSIS — R410 Disorientation, unspecified: Secondary | ICD-10-CM | POA: Diagnosis not present

## 2017-01-22 DIAGNOSIS — R5382 Chronic fatigue, unspecified: Secondary | ICD-10-CM | POA: Diagnosis not present

## 2017-01-30 DIAGNOSIS — J449 Chronic obstructive pulmonary disease, unspecified: Secondary | ICD-10-CM | POA: Diagnosis not present

## 2017-02-14 DIAGNOSIS — D225 Melanocytic nevi of trunk: Secondary | ICD-10-CM | POA: Diagnosis not present

## 2017-02-14 DIAGNOSIS — Z85828 Personal history of other malignant neoplasm of skin: Secondary | ICD-10-CM | POA: Diagnosis not present

## 2017-02-14 DIAGNOSIS — D2261 Melanocytic nevi of right upper limb, including shoulder: Secondary | ICD-10-CM | POA: Diagnosis not present

## 2017-02-14 DIAGNOSIS — D2271 Melanocytic nevi of right lower limb, including hip: Secondary | ICD-10-CM | POA: Diagnosis not present

## 2017-02-27 DIAGNOSIS — R0902 Hypoxemia: Secondary | ICD-10-CM | POA: Diagnosis not present

## 2017-02-27 DIAGNOSIS — J449 Chronic obstructive pulmonary disease, unspecified: Secondary | ICD-10-CM | POA: Diagnosis not present

## 2017-02-27 DIAGNOSIS — R0609 Other forms of dyspnea: Secondary | ICD-10-CM | POA: Diagnosis not present

## 2017-03-01 DIAGNOSIS — J449 Chronic obstructive pulmonary disease, unspecified: Secondary | ICD-10-CM | POA: Diagnosis not present

## 2017-04-01 DIAGNOSIS — J449 Chronic obstructive pulmonary disease, unspecified: Secondary | ICD-10-CM | POA: Diagnosis not present

## 2017-04-02 ENCOUNTER — Ambulatory Visit
Admission: RE | Admit: 2017-04-02 | Discharge: 2017-04-02 | Disposition: A | Discharge status: Home / Self Care | Payer: Medicare HMO | Source: Ambulatory Visit | Attending: Family Medicine | Admitting: Family Medicine

## 2017-04-02 ENCOUNTER — Encounter: Payer: Self-pay | Admitting: Family Medicine

## 2017-04-02 ENCOUNTER — Ambulatory Visit (INDEPENDENT_AMBULATORY_CARE_PROVIDER_SITE_OTHER): Payer: Medicare HMO | Admitting: Family Medicine

## 2017-04-02 VITALS — BP 124/70 | HR 69 | Temp 98.0°F | Resp 16 | Wt 172.0 lb

## 2017-04-02 DIAGNOSIS — M25551 Pain in right hip: Secondary | ICD-10-CM | POA: Insufficient documentation

## 2017-04-02 NOTE — Progress Notes (Signed)
Patient: Toni White Female    DOB: March 03, 1936   81 y.o.   MRN: [id number] Visit Date: 04/02/2017  Today's Provider: Lelon Huh, MD   Chief Complaint  Patient presents with  . Hip Pain   Subjective:    Hip Pain   The incident occurred 6 to 12 hours ago (started suddenly while cooking breakfast this morning). The incident occurred at home. There was no injury mechanism. The pain is present in the right hip. Quality: sharp. The pain is severe. The pain has been intermittent since onset. The symptoms are aggravated by weight bearing. She has tried rest for the symptoms.  Patient was standing at stove cooking breakfast this morning. Patient had past history of a pelvic fracture 8-10 years ago. No history of hip surgery. Took a single ibuprofen this morning.      Allergies  Allergen Reactions  . Flexeril  [Cyclobenzaprine Hcl]     Confusion  . Reglan [Metoclopramide] Hives  . Relafen [Nabumetone] Hives  . Risperdal  [Risperidone]     Confusion Rash  . Sulfa Antibiotics Hives  . Tape Other (See Comments)    Per patient pulls her skin off.  . Triamterene-Hctz     Hypercalcemia  . Hctz [Hydrochlorothiazide] Palpitations     Current Outpatient Prescriptions:  .  albuterol (VENTOLIN HFA) 108 (90 BASE) MCG/ACT inhaler, Inhale 2 puffs into the lungs. Every 4-6 hours as needed, Disp: , Rfl:  .  alendronate (FOSAMAX) 70 MG tablet, Take 1 tablet (70 mg total) by mouth every 7 (seven) days. Take with a full glass of water on an empty stomach., Disp: 12 tablet, Rfl: 4 .  amLODipine (NORVASC) 2.5 MG tablet, TAKE 1 TABLET EVERY DAY, Disp: 90 tablet, Rfl: 4 .  aspirin 81 MG tablet, Take 81 mg by mouth daily., Disp: , Rfl:  .  Calcium-Magnesium-Vitamin D (CALCIUM 500 PO), Take 1 tablet by mouth 2 (two) times daily., Disp: , Rfl:  .  citalopram (CELEXA) 20 MG tablet, TAKE 1 TABLET EVERY DAY, Disp: , Rfl:  .  Fluticasone-Salmeterol (ADVAIR) 250-50 MCG/DOSE AEPB, Inhale 1 puff into the  lungs 2 (two) times daily., Disp: , Rfl:  .  furosemide (LASIX) 20 MG tablet, TAKE 1 TABLET TWICE DAILY AS NEEDED  FOR  SWELLING, Disp: 90 tablet, Rfl: 3 .  gabapentin (NEURONTIN) 100 MG capsule, Take 1 capsule by mouth 2 (two) times daily., Disp: , Rfl:  .  levothyroxine (SYNTHROID, LEVOTHROID) 25 MCG tablet, TAKE 1 AND 1/2 TABLETS ONE TIME DAILY Take on an empty stomach with a glass of water at least 30-60 minutes before breakfast., Disp: , Rfl:  .  metoprolol succinate (TOPROL-XL) 50 MG 24 hr tablet, TAKE 1/2 TABLET EVERY DAY, Disp: 45 tablet, Rfl: 5 .  Multiple Vitamins-Minerals (OCUVITE PO), Take 2 tablets by mouth daily., Disp: , Rfl:  .  naproxen (NAPROSYN) 500 MG tablet, Take 500 mg by mouth 2 (two) times daily with a meal., Disp: , Rfl:  .  OXYGEN, Inhale into the lungs Nightly., Disp: , Rfl:  .  pravastatin (PRAVACHOL) 40 MG tablet, Take 1 tablet (40 mg total) by mouth at bedtime., Disp: 90 tablet, Rfl: 4 .  ranitidine (ZANTAC) 300 MG tablet, TAKE 1 TABLET AT BEDTIME, Disp: 90 tablet, Rfl: 3 .  roflumilast (DALIRESP) 500 MCG TABS tablet, Take 500 mcg by mouth daily., Disp: , Rfl:  .  tiotropium (SPIRIVA) 18 MCG inhalation capsule, Place 18 mcg into inhaler and  inhale daily., Disp: , Rfl:   Review of Systems  Constitutional: Negative for appetite change, chills, fatigue and fever.  Respiratory: Negative for chest tightness and shortness of breath.   Cardiovascular: Negative for chest pain and palpitations.  Gastrointestinal: Negative for abdominal pain, nausea and vomiting.  Musculoskeletal: Positive for arthralgias (right hip pain).  Neurological: Negative for dizziness and weakness.    Social History  Substance Use Topics  . Smoking status: Former Smoker    Packs/day: 1.00    Years: 30.00    Types: Cigarettes  . Smokeless tobacco: Never Used     Comment: quit around 1989  . Alcohol use No   Objective:   BP 124/70 (BP Location: Left Arm, Patient Position: Sitting, Cuff  Size: Large)   Pulse 69   Temp 98 F (36.7 C) (Oral)   Resp 16   Wt 172 lb (78 kg)   SpO2 96% Comment: room air  BMI 32.50 kg/m  There were no vitals filed for this visit.   Physical Exam  General appearance: alert, well developed, well nourished, cooperative and in no distress Head: Normocephalic, without obvious abnormality, atraumatic Respiratory: Respirations even and unlabored, normal respiratory rate MS: Moderate tenderness right lateral hip focused over right greater trochanter  Xray _negative.    Assessment & Plan:     1. Right hip pain Likely trochanteric bursitis, but sudden onset is a little unusual. No sign of fracture on Xray. She is to try the naprosyn prescription that she has at home. Consider meloxicam of naprosyn is not effective.  - DG HIP UNILAT W OR W/O PELVIS 2-3 VIEWS RIGHT; Future       Lelon Huh, MD  [facility]

## 2017-04-05 ENCOUNTER — Telehealth: Payer: Self-pay | Admitting: Family Medicine

## 2017-04-05 MED ORDER — TRAMADOL HCL 50 MG PO TABS: 50.0000 mg | ORAL_TABLET | Freq: Three times a day (TID) | ORAL | 0 refills | Status: DC | PRN

## 2017-04-05 NOTE — Telephone Encounter (Signed)
Patient was notified. Rx called into pharmacy.

## 2017-04-05 NOTE — Telephone Encounter (Signed)
Please call in Tramadol 50mg  one every eight hours as needed for pain, #30, rf x 0.  Should be improving within a week. If not significantly better by next Wednesday will need to repeat xray.

## 2017-04-05 NOTE — Telephone Encounter (Signed)
Pt states she was seen on Monday for hip pain.  Pt states she is still having a lot of pain.  Pt is asking if she can get something different for pain.  [facility].  CB#[phone]/MW

## 2017-04-05 NOTE — Telephone Encounter (Signed)
Please advise? Patient was seen 04/02/2017 in office.

## 2017-04-24 DIAGNOSIS — E119 Type 2 diabetes mellitus without complications: Secondary | ICD-10-CM | POA: Diagnosis not present

## 2017-04-24 DIAGNOSIS — H40003 Preglaucoma, unspecified, bilateral: Secondary | ICD-10-CM | POA: Diagnosis not present

## 2017-04-24 LAB — HM DIABETES EYE EXAM

## 2017-04-25 ENCOUNTER — Encounter: Payer: Self-pay | Admitting: *Deleted

## 2017-04-25 ENCOUNTER — Encounter: Payer: Self-pay | Admitting: Family Medicine

## 2017-05-01 DIAGNOSIS — H353123 Nonexudative age-related macular degeneration, left eye, advanced atrophic without subfoveal involvement: Secondary | ICD-10-CM | POA: Diagnosis not present

## 2017-05-02 ENCOUNTER — Ambulatory Visit (INDEPENDENT_AMBULATORY_CARE_PROVIDER_SITE_OTHER): Payer: Medicare HMO | Admitting: Family Medicine

## 2017-05-02 ENCOUNTER — Other Ambulatory Visit: Payer: Self-pay

## 2017-05-02 ENCOUNTER — Encounter: Payer: Self-pay | Admitting: Family Medicine

## 2017-05-02 VITALS — BP 130/78 | HR 73 | Temp 97.9°F | Resp 16 | Wt 171.0 lb

## 2017-05-02 DIAGNOSIS — Z23 Encounter for immunization: Secondary | ICD-10-CM | POA: Diagnosis not present

## 2017-05-02 DIAGNOSIS — I83893 Varicose veins of bilateral lower extremities with other complications: Secondary | ICD-10-CM | POA: Diagnosis not present

## 2017-05-02 DIAGNOSIS — R0600 Dyspnea, unspecified: Secondary | ICD-10-CM | POA: Diagnosis not present

## 2017-05-02 DIAGNOSIS — R600 Localized edema: Secondary | ICD-10-CM

## 2017-05-02 DIAGNOSIS — I10 Essential (primary) hypertension: Secondary | ICD-10-CM

## 2017-05-02 DIAGNOSIS — Z1231 Encounter for screening mammogram for malignant neoplasm of breast: Secondary | ICD-10-CM

## 2017-05-02 DIAGNOSIS — J449 Chronic obstructive pulmonary disease, unspecified: Secondary | ICD-10-CM | POA: Diagnosis not present

## 2017-05-02 NOTE — Progress Notes (Signed)
Patient: Toni White Female    DOB: March 28, 1936   81 y.o.   MRN: [id number] Visit Date: 05/02/2017  Today's Provider: Lelon Huh, MD   Chief Complaint  Patient presents with  . Leg Swelling   Subjective:    HPI Swelling:  Patient comes in reporting that she has had swelling in her lower legs and ankles for the past 2 weeks. Patient has also had some pain and redness in her feet along with chest pain. Patient has tried taking Furosemide 20mg  twice daily and elevating her feet. Has felt a little short of breath on exertion. She had normal thallium stress test in June ordered by Dr. Ubaldo Glassing. Has had no stinging or burning in legs. No recent change in medications, no recent change in diet.     Allergies  Allergen Reactions  . Flexeril  [Cyclobenzaprine Hcl]     Confusion  . Reglan [Metoclopramide] Hives  . Relafen [Nabumetone] Hives  . Risperdal  [Risperidone]     Confusion Rash  . Sulfa Antibiotics Hives  . Tape Other (See Comments)    Per patient pulls her skin off.  . Triamterene-Hctz     Hypercalcemia  . Hctz [Hydrochlorothiazide] Palpitations     Current Outpatient Prescriptions:  .  albuterol (VENTOLIN HFA) 108 (90 BASE) MCG/ACT inhaler, Inhale 2 puffs into the lungs. Every 4-6 hours as needed, Disp: , Rfl:  .  alendronate (FOSAMAX) 70 MG tablet, Take 1 tablet (70 mg total) by mouth every 7 (seven) days. Take with a full glass of water on an empty stomach., Disp: 12 tablet, Rfl: 4 .  amLODipine (NORVASC) 2.5 MG tablet, TAKE 1 TABLET EVERY DAY, Disp: 90 tablet, Rfl: 4 .  aspirin 81 MG tablet, Take 81 mg by mouth daily., Disp: , Rfl:  .  Calcium-Magnesium-Vitamin D (CALCIUM 500 PO), Take 1 tablet by mouth 2 (two) times daily., Disp: , Rfl:  .  citalopram (CELEXA) 20 MG tablet, TAKE 1 TABLET EVERY DAY, Disp: , Rfl:  .  Fluticasone-Salmeterol (ADVAIR) 250-50 MCG/DOSE AEPB, Inhale 1 puff into the lungs 2 (two) times daily., Disp: , Rfl:  .  furosemide (LASIX) 20 MG  tablet, TAKE 1 TABLET TWICE DAILY AS NEEDED  FOR  SWELLING, Disp: 90 tablet, Rfl: 3 .  gabapentin (NEURONTIN) 100 MG capsule, Take 1 capsule by mouth 2 (two) times daily., Disp: , Rfl:  .  levothyroxine (SYNTHROID, LEVOTHROID) 25 MCG tablet, TAKE 1 AND 1/2 TABLETS ONE TIME DAILY Take on an empty stomach with a glass of water at least 30-60 minutes before breakfast., Disp: , Rfl:  .  metoprolol succinate (TOPROL-XL) 50 MG 24 hr tablet, TAKE 1/2 TABLET EVERY DAY, Disp: 45 tablet, Rfl: 5 .  Multiple Vitamins-Minerals (OCUVITE PO), Take 2 tablets by mouth daily., Disp: , Rfl:  .  naproxen (NAPROSYN) 500 MG tablet, Take 500 mg by mouth 2 (two) times daily with a meal., Disp: , Rfl:  .  OXYGEN, Inhale into the lungs Nightly., Disp: , Rfl:  .  pravastatin (PRAVACHOL) 40 MG tablet, Take 1 tablet (40 mg total) by mouth at bedtime., Disp: 90 tablet, Rfl: 4 .  ranitidine (ZANTAC) 300 MG tablet, TAKE 1 TABLET AT BEDTIME, Disp: 90 tablet, Rfl: 3 .  roflumilast (DALIRESP) 500 MCG TABS tablet, Take 500 mcg by mouth daily., Disp: , Rfl:  .  tiotropium (SPIRIVA) 18 MCG inhalation capsule, Place 18 mcg into inhaler and inhale daily., Disp: , Rfl:  .  traMADol (ULTRAM) 50 MG tablet, Take 1 tablet (50 mg total) by mouth every 8 (eight) hours as needed., Disp: 30 tablet, Rfl: 0  Review of Systems  Constitutional: Negative for appetite change, chills, fatigue and fever.  Eyes: Positive for visual disturbance.  Respiratory: Negative for chest tightness and shortness of breath.   Cardiovascular: Positive for chest pain and leg swelling. Negative for palpitations.  Gastrointestinal: Negative for abdominal pain, nausea and vomiting.  Neurological: Negative for dizziness and weakness.    Social History  Substance Use Topics  . Smoking status: Former Smoker    Packs/day: 1.00    Years: 30.00    Types: Cigarettes  . Smokeless tobacco: Never Used     Comment: quit around 1989  . Alcohol use No   Objective:   BP  130/78 (BP Location: Left Arm, Patient Position: Sitting, Cuff Size: Large)   Pulse 73   Temp 97.9 F (36.6 C) (Oral)   Resp 16   Wt 171 lb (77.6 kg)   BMI 32.31 kg/m  There were no vitals filed for this visit.   Physical Exam   General Appearance:    Alert, cooperative, no distress  Eyes:    PERRL, conjunctiva/corneas clear, EOM's intact       Lungs:     Clear to auscultation bilaterally, respirations unlabored  Heart:    Regular rate and rhythm  Neurologic:   Awake, alert, oriented x 3. No apparent focal neurological           defect.   Ext:  3+ bipedal and lower leg edema with extensive varicosities of both legs, no erythema.        Assessment & Plan:     1. Edema extremities Likely multifactorial. Has significant varicosities which are likely primary cause of edema. Check labs. Is already on furosemide and using compression stockings.  - COMPLETE METABOLIC PANEL WITH GFR - CBC - Brain natriuretic peptide - TSH  2. Dyspnea, unspecified type  - CBC - Brain natriuretic peptide  3. Essential (primary) hypertension Stable. Continue current medications.    4. Varicose veins of both legs with edema Consider vascular referral.   5. Need for influenza vaccination  - Flu vaccine HIGH DOSE PF (Fluzone High dose)       Lelon Huh, MD  [facility]

## 2017-05-02 NOTE — Patient Instructions (Signed)
Varicose Veins Varicose veins are veins that have become enlarged and twisted. They are usually seen in the legs but can occur in other parts of the body as well. What are the causes? This condition is the result of valves in the veins not working properly. Valves in the veins help to return blood from the leg to the heart. If these valves are damaged, blood flows backward and backs up into the veins in the leg near the skin. This causes the veins to become larger. What increases the risk? People who are on their feet a lot, who are pregnant, or who are overweight are more likely to develop varicose veins. What are the signs or symptoms?  Bulging, twisted-appearing, bluish veins, most commonly found on the legs.  Leg pain or a feeling of heaviness. These symptoms may be worse at the end of the day.  Leg swelling.  Changes in skin color. How is this diagnosed? A health care provider can usually diagnose varicose veins by examining your legs. Your health care provider may also recommend an ultrasound of your leg veins. How is this treated? Most varicose veins can be treated at home.However, other treatments are available for people who have persistent symptoms or want to improve the cosmetic appearance of the varicose veins. These treatment options include:  Sclerotherapy. A solution is injected into the vein to close it off.  Laser treatment. A laser is used to heat the vein to close it off.  Radiofrequency vein ablation. An electrical current produced by radio waves is used to close off the vein.  Phlebectomy. The vein is surgically removed through small incisions made over the varicose vein.  Vein ligation and stripping. The vein is surgically removed through incisions made over the varicose vein after the vein has been tied (ligated). Follow these instructions at home:   Do not stand or sit in one position for long periods of time. Do not sit with your legs crossed. Rest with your  legs raised during the day.  Wear compression stockings as directed by your health care provider. These stockings help to prevent blood clots and reduce swelling in your legs.  Do not wear other tight, encircling garments around your legs, pelvis, or waist.  Walk as much as possible to increase blood flow.  Raise the foot of your bed at night with 2-inch blocks.  If you get a cut in the skin over the vein and the vein bleeds, lie down with your leg raised and press on it with a clean cloth until the bleeding stops. Then place a bandage (dressing) on the cut. See your health care provider if it continues to bleed. Contact a health care provider if:  The skin around your ankle starts to break down.  You have pain, redness, tenderness, or hard swelling in your leg over a vein.  You are uncomfortable because of leg pain. This information is not intended to replace advice given to you by your health care provider. Make sure you discuss any questions you have with your health care provider. Document Released: 05/03/2005 Document Revised: 12/30/2015 Document Reviewed: 01/25/2016 Elsevier Interactive Patient Education  2017 [employer]  

## 2017-05-03 ENCOUNTER — Telehealth: Payer: Self-pay

## 2017-05-03 LAB — CBC
HEMATOCRIT: 43.6 % (ref 35.0–45.0)
Hemoglobin: 14.2 g/dL (ref 11.7–15.5)
MCH: 28.8 pg (ref 27.0–33.0)
MCHC: 32.6 g/dL (ref 32.0–36.0)
MCV: 88.4 fL (ref 80.0–100.0)
MPV: 11.8 fL (ref 7.5–12.5)
Platelets: 259 10*3/uL (ref 140–400)
RBC: 4.93 10*6/uL (ref 3.80–5.10)
RDW: 12.7 % (ref 11.0–15.0)
WBC: 8.5 10*3/uL (ref 3.8–10.8)

## 2017-05-03 LAB — COMPLETE METABOLIC PANEL WITH GFR
AG Ratio: 1.4 (calc) (ref 1.0–2.5)
ALBUMIN MSPROF: 3.9 g/dL (ref 3.6–5.1)
ALKALINE PHOSPHATASE (APISO): 58 U/L (ref 33–130)
ALT: 18 U/L (ref 6–29)
AST: 23 U/L (ref 10–35)
BUN: 18 mg/dL (ref 7–25)
CO2: 33 mmol/L — AB (ref 20–32)
CREATININE: 0.91 mg/dL (ref 0.60–0.93)
Calcium: 10.4 mg/dL (ref 8.6–10.4)
Chloride: 97 mmol/L — ABNORMAL LOW (ref 98–110)
GFR, Est African American: 70 mL/min/{1.73_m2} (ref >=60)
GFR, Est Non African American: 60 mL/min/{1.73_m2} (ref >=60)
GLUCOSE: 80 mg/dL (ref 65–139)
Globulin: 2.7 g/dL (calc) (ref 1.9–3.7)
Potassium: 4.1 mmol/L (ref 3.5–5.3)
SODIUM: 138 mmol/L (ref 135–146)
Total Bilirubin: 0.6 mg/dL (ref 0.2–1.2)
Total Protein: 6.6 g/dL (ref 6.1–8.1)

## 2017-05-03 LAB — TSH: TSH: 1.78 mIU/L (ref 0.40–4.50)

## 2017-05-03 LAB — BRAIN NATRIURETIC PEPTIDE: BRAIN NATRIURETIC PEPTIDE: 49 pg/mL (ref <100)

## 2017-05-03 NOTE — Telephone Encounter (Signed)
-----   Message from Birdie Sons, MD sent at 05/03/2017  7:52 AM EDT ----- Labs are all normal. Swelling is likely due to varicose veins. Need to keep legs elevated when not walking, avoid salty foods. May want to see vascular specialist for varicose veins. OK to refer to vascular surgery if she wants.

## 2017-05-03 NOTE — Telephone Encounter (Signed)
Patient was notified she states that she will speak with her daughter in law about referral first and will call us back if she wishes to proceed. KW

## 2017-05-07 ENCOUNTER — Other Ambulatory Visit: Payer: Self-pay | Admitting: Family Medicine

## 2017-05-09 ENCOUNTER — Other Ambulatory Visit: Payer: Self-pay | Admitting: Family Medicine

## 2017-05-25 ENCOUNTER — Other Ambulatory Visit: Payer: Self-pay | Admitting: Family Medicine

## 2017-05-25 NOTE — Telephone Encounter (Signed)
Pt contacted office for refill request on the following medications:  traMADol (ULTRAM) 50 MG tablet   [facility].  CB#[phone]/MW

## 2017-05-26 MED ORDER — TRAMADOL HCL 50 MG PO TABS: 50.0000 mg | ORAL_TABLET | Freq: Three times a day (TID) | ORAL | 2 refills | Status: DC | PRN

## 2017-05-26 NOTE — Telephone Encounter (Signed)
Please call in tramadol.  

## 2017-05-28 ENCOUNTER — Ambulatory Visit (INDEPENDENT_AMBULATORY_CARE_PROVIDER_SITE_OTHER): Payer: Medicare HMO | Admitting: Family Medicine

## 2017-05-28 ENCOUNTER — Encounter: Payer: Self-pay | Admitting: Family Medicine

## 2017-05-28 VITALS — BP 150/90 | HR 78 | Temp 98.2°F | Resp 16 | Wt 169.0 lb

## 2017-05-28 DIAGNOSIS — M7061 Trochanteric bursitis, right hip: Secondary | ICD-10-CM | POA: Diagnosis not present

## 2017-05-28 DIAGNOSIS — M533 Sacrococcygeal disorders, not elsewhere classified: Secondary | ICD-10-CM

## 2017-05-28 MED ORDER — METHYLPREDNISOLONE ACETATE 80 MG/ML IJ SUSP
80.0000 mg | Freq: Once | INTRAMUSCULAR | Status: AC
  Administered 2017-05-28: 80 mg via INTRAMUSCULAR

## 2017-05-28 MED ORDER — MELOXICAM 15 MG PO TABS: 15.0000 mg | ORAL_TABLET | Freq: Every day | ORAL | 0 refills | Status: DC

## 2017-05-28 NOTE — Patient Instructions (Signed)
   Try using Cetaphil to dry skin on feet.

## 2017-05-28 NOTE — Progress Notes (Signed)
Patient: Toni White Female    DOB: 10/19/1935   81 y.o.   MRN: [id number] Visit Date: 05/28/2017  Today's Provider: Lelon Huh, MD   Chief Complaint  Patient presents with  . Hip Pain   Subjective:    Patient was seen in office for right hip pain. X-ray done on right hip showing it was normal. Started naproxen which did not help. Patient was then started on tramadol which only helps mildly. Patient states pain seems to have gotten worse. Pain is described as stabbing. Patient is having trouble sleeping due to pain.       Allergies  Allergen Reactions  . Flexeril  [Cyclobenzaprine Hcl]     Confusion  . Reglan [Metoclopramide] Hives  . Relafen [Nabumetone] Hives  . Risperdal  [Risperidone]     Confusion Rash  . Sulfa Antibiotics Hives  . Tape Other (See Comments)    Per patient pulls her skin off.  . Triamterene-Hctz     Hypercalcemia  . Hctz [Hydrochlorothiazide] Palpitations     Current Outpatient Prescriptions:  .  albuterol (VENTOLIN HFA) 108 (90 BASE) MCG/ACT inhaler, Inhale 2 puffs into the lungs. Every 4-6 hours as needed, Disp: , Rfl:  .  alendronate (FOSAMAX) 70 MG tablet, Take 1 tablet (70 mg total) by mouth every 7 (seven) days. Take with a full glass of water on an empty stomach., Disp: 12 tablet, Rfl: 4 .  amLODipine (NORVASC) 2.5 MG tablet, TAKE 1 TABLET EVERY DAY, Disp: 90 tablet, Rfl: 4 .  aspirin 81 MG tablet, Take 81 mg by mouth daily., Disp: , Rfl:  .  Calcium-Magnesium-Vitamin D (CALCIUM 500 PO), Take 1 tablet by mouth 2 (two) times daily., Disp: , Rfl:  .  citalopram (CELEXA) 20 MG tablet, TAKE 1 TABLET EVERY DAY, Disp: , Rfl:  .  Fluticasone-Salmeterol (ADVAIR) 250-50 MCG/DOSE AEPB, Inhale 1 puff into the lungs 2 (two) times daily., Disp: , Rfl:  .  furosemide (LASIX) 20 MG tablet, TAKE 1 TABLET TWICE DAILY AS NEEDED  FOR  SWELLING, Disp: 90 tablet, Rfl: 3 .  gabapentin (NEURONTIN) 100 MG capsule, Take 1 capsule by mouth 2 (two) times  daily., Disp: , Rfl:  .  levothyroxine (SYNTHROID, LEVOTHROID) 25 MCG tablet, TAKE 1 AND 1/2 TABLETS ONE TIME DAILY Take on an empty stomach with a glass of water at least 30-60 minutes before breakfast., Disp: , Rfl:  .  metoprolol succinate (TOPROL-XL) 50 MG 24 hr tablet, TAKE 1/2 TABLET EVERY DAY, Disp: 45 tablet, Rfl: 5 .  Multiple Vitamins-Minerals (OCUVITE PO), Take 2 tablets by mouth daily., Disp: , Rfl:  .  naproxen (NAPROSYN) 500 MG tablet, Take 500 mg by mouth 2 (two) times daily with a meal., Disp: , Rfl:  .  OXYGEN, Inhale into the lungs Nightly., Disp: , Rfl:  .  pravastatin (PRAVACHOL) 40 MG tablet, Take 1 tablet (40 mg total) by mouth at bedtime., Disp: 90 tablet, Rfl: 4 .  ranitidine (ZANTAC) 300 MG tablet, TAKE 1 TABLET AT BEDTIME, Disp: 90 tablet, Rfl: 3 .  roflumilast (DALIRESP) 500 MCG TABS tablet, Take 500 mcg by mouth daily., Disp: , Rfl:  .  tiotropium (SPIRIVA) 18 MCG inhalation capsule, Place 18 mcg into inhaler and inhale daily., Disp: , Rfl:  .  traMADol (ULTRAM) 50 MG tablet, Take 1 tablet (50 mg total) by mouth every 8 (eight) hours as needed., Disp: 30 tablet, Rfl: 2  Review of Systems  Constitutional: Negative for  appetite change, chills, fatigue and fever.  Respiratory: Negative for chest tightness and shortness of breath.   Cardiovascular: Negative for chest pain and palpitations.  Gastrointestinal: Negative for abdominal pain, nausea and vomiting.  Neurological: Negative for dizziness and weakness.    Social History  Substance Use Topics  . Smoking status: Former Smoker    Packs/day: 1.00    Years: 30.00    Types: Cigarettes  . Smokeless tobacco: Never Used     Comment: quit around 1989  . Alcohol use No   Objective:   BP (!) 150/90 (BP Location: Right Arm, Patient Position: Sitting, Cuff Size: Normal)   Pulse 78   Temp 98.2 F (36.8 C) (Oral)   Resp 16   Wt 169 lb (76.7 kg)   SpO2 94%   BMI 31.93 kg/m  Vitals:   05/28/17 1153  BP: (!)  150/90  Pulse: 78  Resp: 16  Temp: 98.2 F (36.8 C)  TempSrc: Oral  SpO2: 94%  Weight: 169 lb (76.7 kg)     Physical Exam  Moderate tenderness over right greater trochanter and right SI joint. No gross deformities. No erythema.     Assessment & Plan:      1. Trochanteric bursitis of right hip  - meloxicam (MOBIC) 15 MG tablet; Take 1 tablet (15 mg total) by mouth daily.  Dispense: 30 tablet; Refill: 0 - methylPREDNISolone acetate (DEPO-MEDROL) injection 40 mg; Inject 1 mL (80 mg total) into the muscle once.  2. Sacroiliac joint pain  - meloxicam (MOBIC) 15 MG tablet; Take 1 tablet (15 mg total) by mouth daily.  Dispense: 30 tablet; Refill: 0 - methylPREDNISolone acetate (DEPO-MEDROL) injection 40 mg; Inject 1 mL (80 mg total) into the muscle once.  Prepped skin over right greater trochanter and right SI joint with isopropyl alcohol and injected 1/2 ml of 80mg /ml depot medrol into each site. Patient tolerated well.   Call if symptoms change or if not rapidly improving.          Lelon Huh, MD  [facility]

## 2017-05-28 NOTE — Telephone Encounter (Signed)
Rx called in to pharmacy. 

## 2017-05-31 DIAGNOSIS — J449 Chronic obstructive pulmonary disease, unspecified: Secondary | ICD-10-CM | POA: Diagnosis not present

## 2017-05-31 DIAGNOSIS — E7801 Familial hypercholesterolemia: Secondary | ICD-10-CM | POA: Diagnosis not present

## 2017-06-01 DIAGNOSIS — J449 Chronic obstructive pulmonary disease, unspecified: Secondary | ICD-10-CM | POA: Diagnosis not present

## 2017-06-08 ENCOUNTER — Ambulatory Visit: Payer: Medicare HMO | Admitting: Family Medicine

## 2017-06-08 ENCOUNTER — Encounter: Payer: Self-pay | Admitting: Family Medicine

## 2017-06-08 ENCOUNTER — Ambulatory Visit (INDEPENDENT_AMBULATORY_CARE_PROVIDER_SITE_OTHER): Payer: Medicare HMO | Admitting: Family Medicine

## 2017-06-08 VITALS — BP 126/74 | HR 50 | Temp 98.4°F | Resp 16 | Ht 61.0 in | Wt 164.0 lb

## 2017-06-08 DIAGNOSIS — E785 Hyperlipidemia, unspecified: Secondary | ICD-10-CM

## 2017-06-08 DIAGNOSIS — R7303 Prediabetes: Secondary | ICD-10-CM

## 2017-06-08 DIAGNOSIS — J449 Chronic obstructive pulmonary disease, unspecified: Secondary | ICD-10-CM | POA: Diagnosis not present

## 2017-06-08 DIAGNOSIS — I10 Essential (primary) hypertension: Secondary | ICD-10-CM | POA: Diagnosis not present

## 2017-06-08 LAB — POCT GLYCOSYLATED HEMOGLOBIN (HGB A1C)
Est. average glucose Bld gHb Est-mCnc: 117
HEMOGLOBIN A1C: 5.7

## 2017-06-08 NOTE — Progress Notes (Signed)
Patient: Toni White Female    DOB: 11/19/1935   81 y.o.   MRN: [id number] Visit Date: 06/08/2017  Today's Provider: Lelon Huh, MD   Chief Complaint  Patient presents with  . Hypertension  . Hyperlipidemia  . Hyperglycemia   Subjective:    HPI   Hypertension, follow-up:  BP Readings from Last 3 Encounters:  06/08/17 126/74  05/28/17 (!) 150/90  05/02/17 130/78    She was last seen for hypertension 2 months ago.  BP at that visit was 130/78. Management since that visit includes; no changes.She reports good compliance with treatment. She is not having side effects.  She is not exercising. She is adherent to low salt diet.   Outside blood pressures are checked daily. She is experiencing none.  Patient denies exertional chest pressure/discomfort, lower extremity edema and palpitations.   Cardiovascular risk factors include dyslipidemia.      Lipid/Cholesterol, Follow-up:   Last seen for this 6 months ago.  Management since that visit includes; labs checked, no changes.  Last Lipid Panel:    Component Value Date/Time   CHOL 161 12/06/2016 0936   TRIG 156 (H) 12/06/2016 0936   HDL 45 12/06/2016 0936   CHOLHDL 3.6 12/06/2016 0936   LDLCALC 85 12/06/2016 0936    She reports good compliance with treatment. She is not having side effects.   Wt Readings from Last 3 Encounters:  06/08/17 164 lb (74.4 kg)  05/28/17 169 lb (76.7 kg)  05/02/17 171 lb (77.6 kg)    Prediabetes From 12/06/2016-Hemoglobin A1c 5.7. No changes.     Allergies  Allergen Reactions  . Flexeril  [Cyclobenzaprine Hcl]     Confusion  . Reglan [Metoclopramide] Hives  . Relafen [Nabumetone] Hives  . Risperdal  [Risperidone]     Confusion Rash  . Sulfa Antibiotics Hives  . Tape Other (See Comments)    Per patient pulls her skin off.  . Triamterene-Hctz     Hypercalcemia  . Hctz [Hydrochlorothiazide] Palpitations     Current Outpatient Prescriptions:  .  albuterol  (VENTOLIN HFA) 108 (90 BASE) MCG/ACT inhaler, Inhale 2 puffs into the lungs. Every 4-6 hours as needed, Disp: , Rfl:  .  alendronate (FOSAMAX) 70 MG tablet, Take 1 tablet (70 mg total) by mouth every 7 (seven) days. Take with a full glass of water on an empty stomach., Disp: 12 tablet, Rfl: 4 .  amLODipine (NORVASC) 2.5 MG tablet, TAKE 1 TABLET EVERY DAY, Disp: 90 tablet, Rfl: 4 .  aspirin 81 MG tablet, Take 81 mg by mouth daily., Disp: , Rfl:  .  Calcium-Magnesium-Vitamin D (CALCIUM 500 PO), Take 1 tablet by mouth 2 (two) times daily., Disp: , Rfl:  .  citalopram (CELEXA) 20 MG tablet, TAKE 1 TABLET EVERY DAY, Disp: , Rfl:  .  Fluticasone-Salmeterol (ADVAIR) 250-50 MCG/DOSE AEPB, Inhale 1 puff into the lungs 2 (two) times daily., Disp: , Rfl:  .  furosemide (LASIX) 20 MG tablet, TAKE 1 TABLET TWICE DAILY AS NEEDED  FOR  SWELLING, Disp: 90 tablet, Rfl: 3 .  gabapentin (NEURONTIN) 100 MG capsule, Take 1 capsule by mouth 2 (two) times daily., Disp: , Rfl:  .  levothyroxine (SYNTHROID, LEVOTHROID) 25 MCG tablet, TAKE 1 AND 1/2 TABLETS ONE TIME DAILY Take on an empty stomach with a glass of water at least 30-60 minutes before breakfast., Disp: , Rfl:  .  meloxicam (MOBIC) 15 MG tablet, Take 1 tablet (15 mg total) by mouth  daily., Disp: 30 tablet, Rfl: 0 .  metoprolol succinate (TOPROL-XL) 50 MG 24 hr tablet, TAKE 1/2 TABLET EVERY DAY, Disp: 45 tablet, Rfl: 5 .  Multiple Vitamins-Minerals (PRESERVISION AREDS 2+MULTI VIT) CAPS, Take 1 capsule by mouth 2 (two) times daily., Disp: , Rfl:  .  OXYGEN, Inhale into the lungs Nightly., Disp: , Rfl:  .  pravastatin (PRAVACHOL) 40 MG tablet, Take 1 tablet (40 mg total) by mouth at bedtime., Disp: 90 tablet, Rfl: 4 .  ranitidine (ZANTAC) 300 MG tablet, TAKE 1 TABLET AT BEDTIME, Disp: 90 tablet, Rfl: 3 .  roflumilast (DALIRESP) 500 MCG TABS tablet, Take 500 mcg by mouth daily., Disp: , Rfl:  .  tiotropium (SPIRIVA) 18 MCG inhalation capsule, Place 18 mcg into  inhaler and inhale daily., Disp: , Rfl:  .  traMADol (ULTRAM) 50 MG tablet, Take 1 tablet (50 mg total) by mouth every 8 (eight) hours as needed., Disp: 30 tablet, Rfl: 2  Review of Systems  Constitutional: Negative for appetite change, chills, fatigue and fever.  Respiratory: Negative for chest tightness and shortness of breath.   Cardiovascular: Negative for chest pain and palpitations.  Gastrointestinal: Negative for abdominal pain, nausea and vomiting.  Neurological: Negative for dizziness and weakness.    Social History  Substance Use Topics  . Smoking status: Former Smoker    Packs/day: 1.00    Years: 30.00    Types: Cigarettes  . Smokeless tobacco: Never Used     Comment: quit around 1989  . Alcohol use No   Objective:   BP 126/74 (BP Location: Left Arm, Patient Position: Sitting, Cuff Size: Normal)   Pulse (!) 50   Temp 98.4 F (36.9 C)   Resp 16   Ht 5\' 1"  (1.549 m)   Wt 164 lb (74.4 kg)   SpO2 93%   BMI 30.99 kg/m  Vitals:   06/08/17 1135  BP: 126/74  Pulse: (!) 50  Resp: 16  Temp: 98.4 F (36.9 C)  SpO2: 93%  Weight: 164 lb (74.4 kg)  Height: 5\' 1"  (1.549 m)     Physical Exam   General Appearance:    Alert, cooperative, no distress  Eyes:    PERRL, conjunctiva/corneas clear, EOM's intact       Lungs:     Clear to auscultation bilaterally, respirations unlabored  Heart:    Regular rate and rhythm  Neurologic:   Awake, alert, oriented x 3. No apparent focal neurological           defect.       Results for orders placed or performed in visit on 06/08/17  POCT glycosylated hemoglobin (Hb A1C)  Result Value Ref Range   Hemoglobin A1C 5.7    Est. average glucose Bld gHb Est-mCnc 117         Assessment & Plan:     1. Essential (primary) hypertension Well controlled.  Continue current medications.    2. Chronic obstructive pulmonary disease, unspecified COPD type ([place]) Doing well on current regiment of inhaerls.   3. Hyperlipidemia,  unspecified hyperlipidemia type She is tolerating pravastatin well with no adverse effects.    4. Pre-diabetes Diet controlled.  - POCT glycosylated hemoglobin (Hb A1C)        Lelon Huh, MD  [facility]

## 2017-06-13 ENCOUNTER — Telehealth: Payer: Self-pay

## 2017-06-13 ENCOUNTER — Other Ambulatory Visit: Payer: Self-pay | Admitting: Family Medicine

## 2017-06-13 NOTE — Telephone Encounter (Signed)
Called and added the 2 refills.

## 2017-06-13 NOTE — Telephone Encounter (Signed)
Patient's daughter [name] is requesting a refill on Tramadol 50 mg be called in at [facility]. Advised [name] that RX was called in on 10/20 with 2 refills. She contacted [facility] and states they received the verbal on 10/20 but no refills were authorized when phoned in. CB# [phone]

## 2017-06-14 NOTE — Telephone Encounter (Signed)
Please call in tramadol.  

## 2017-06-14 NOTE — Telephone Encounter (Signed)
Rx called in to pharmacy. 

## 2017-06-19 ENCOUNTER — Telehealth: Payer: Self-pay | Admitting: Family Medicine

## 2017-06-19 DIAGNOSIS — M25559 Pain in unspecified hip: Secondary | ICD-10-CM

## 2017-06-19 NOTE — Telephone Encounter (Signed)
Pt is requesting a referral to see a specialist for her hip and back pain.  CB#[phone]/MW

## 2017-06-19 NOTE — Telephone Encounter (Signed)
Please advise 

## 2017-06-21 DIAGNOSIS — M5136 Other intervertebral disc degeneration, lumbar region: Secondary | ICD-10-CM | POA: Diagnosis not present

## 2017-06-21 DIAGNOSIS — M5416 Radiculopathy, lumbar region: Secondary | ICD-10-CM | POA: Diagnosis not present

## 2017-07-02 DIAGNOSIS — J449 Chronic obstructive pulmonary disease, unspecified: Secondary | ICD-10-CM | POA: Diagnosis not present

## 2017-07-03 ENCOUNTER — Ambulatory Visit
Admission: RE | Admit: 2017-07-03 | Discharge: 2017-07-03 | Disposition: A | Discharge status: Home / Self Care | Payer: Medicare HMO | Source: Ambulatory Visit | Attending: General Surgery | Admitting: General Surgery

## 2017-07-03 DIAGNOSIS — R0902 Hypoxemia: Secondary | ICD-10-CM | POA: Diagnosis not present

## 2017-07-03 DIAGNOSIS — J449 Chronic obstructive pulmonary disease, unspecified: Secondary | ICD-10-CM | POA: Diagnosis not present

## 2017-07-03 DIAGNOSIS — Z1231 Encounter for screening mammogram for malignant neoplasm of breast: Secondary | ICD-10-CM | POA: Insufficient documentation

## 2017-07-04 ENCOUNTER — Telehealth: Payer: Self-pay

## 2017-07-04 NOTE — Telephone Encounter (Signed)
Patient called and would like to cancer her follow up breast exam appointment. She will be seeing her PCP from now on for exams and mammograms.

## 2017-07-05 ENCOUNTER — Other Ambulatory Visit: Payer: Self-pay | Admitting: Family Medicine

## 2017-07-05 DIAGNOSIS — M7061 Trochanteric bursitis, right hip: Secondary | ICD-10-CM

## 2017-07-05 DIAGNOSIS — M533 Sacrococcygeal disorders, not elsewhere classified: Secondary | ICD-10-CM

## 2017-07-09 DIAGNOSIS — M5416 Radiculopathy, lumbar region: Secondary | ICD-10-CM | POA: Diagnosis not present

## 2017-07-11 ENCOUNTER — Ambulatory Visit: Payer: Medicare HMO | Admitting: General Surgery

## 2017-07-11 DIAGNOSIS — M5416 Radiculopathy, lumbar region: Secondary | ICD-10-CM | POA: Diagnosis not present

## 2017-08-01 DIAGNOSIS — J449 Chronic obstructive pulmonary disease, unspecified: Secondary | ICD-10-CM | POA: Diagnosis not present

## 2017-08-08 DIAGNOSIS — M5416 Radiculopathy, lumbar region: Secondary | ICD-10-CM | POA: Diagnosis not present

## 2017-08-08 DIAGNOSIS — M5136 Other intervertebral disc degeneration, lumbar region: Secondary | ICD-10-CM | POA: Diagnosis not present

## 2017-09-01 DIAGNOSIS — J449 Chronic obstructive pulmonary disease, unspecified: Secondary | ICD-10-CM | POA: Diagnosis not present

## 2017-09-26 DIAGNOSIS — M5136 Other intervertebral disc degeneration, lumbar region: Secondary | ICD-10-CM | POA: Diagnosis not present

## 2017-09-26 DIAGNOSIS — M5416 Radiculopathy, lumbar region: Secondary | ICD-10-CM | POA: Diagnosis not present

## 2017-09-27 DIAGNOSIS — M5416 Radiculopathy, lumbar region: Secondary | ICD-10-CM | POA: Diagnosis not present

## 2017-09-27 DIAGNOSIS — M5136 Other intervertebral disc degeneration, lumbar region: Secondary | ICD-10-CM | POA: Diagnosis not present

## 2017-10-02 DIAGNOSIS — J449 Chronic obstructive pulmonary disease, unspecified: Secondary | ICD-10-CM | POA: Diagnosis not present

## 2017-10-29 DIAGNOSIS — H40003 Preglaucoma, unspecified, bilateral: Secondary | ICD-10-CM | POA: Diagnosis not present

## 2017-10-29 DIAGNOSIS — H353123 Nonexudative age-related macular degeneration, left eye, advanced atrophic without subfoveal involvement: Secondary | ICD-10-CM | POA: Diagnosis not present

## 2017-10-29 LAB — HM DIABETES EYE EXAM

## 2017-10-30 DIAGNOSIS — J449 Chronic obstructive pulmonary disease, unspecified: Secondary | ICD-10-CM | POA: Diagnosis not present

## 2017-11-06 DIAGNOSIS — G4734 Idiopathic sleep related nonobstructive alveolar hypoventilation: Secondary | ICD-10-CM | POA: Diagnosis not present

## 2017-11-06 DIAGNOSIS — J449 Chronic obstructive pulmonary disease, unspecified: Secondary | ICD-10-CM | POA: Diagnosis not present

## 2017-11-07 ENCOUNTER — Other Ambulatory Visit: Payer: Self-pay | Admitting: Family Medicine

## 2017-11-15 DIAGNOSIS — M5416 Radiculopathy, lumbar region: Secondary | ICD-10-CM | POA: Diagnosis not present

## 2017-11-15 DIAGNOSIS — M5136 Other intervertebral disc degeneration, lumbar region: Secondary | ICD-10-CM | POA: Diagnosis not present

## 2017-11-20 DIAGNOSIS — G250 Essential tremor: Secondary | ICD-10-CM | POA: Diagnosis not present

## 2017-11-20 DIAGNOSIS — I725 Aneurysm of other precerebral arteries: Secondary | ICD-10-CM | POA: Diagnosis not present

## 2017-11-20 DIAGNOSIS — E7801 Familial hypercholesterolemia: Secondary | ICD-10-CM | POA: Diagnosis not present

## 2017-11-20 DIAGNOSIS — R0602 Shortness of breath: Secondary | ICD-10-CM | POA: Diagnosis not present

## 2017-11-20 DIAGNOSIS — J449 Chronic obstructive pulmonary disease, unspecified: Secondary | ICD-10-CM | POA: Diagnosis not present

## 2017-11-30 DIAGNOSIS — J449 Chronic obstructive pulmonary disease, unspecified: Secondary | ICD-10-CM | POA: Diagnosis not present

## 2017-12-06 ENCOUNTER — Ambulatory Visit: Payer: Self-pay | Admitting: Family Medicine

## 2017-12-10 NOTE — Progress Notes (Signed)
Patient: Toni White, Female    DOB: 20-Sep-1935, 82 y.o.   MRN: [id number] Visit Date: 12/11/2017  Today's Provider: Lelon Huh, MD   Chief Complaint  Patient presents with  . Annual Exam  . Hypertension  . Hyperlipidemia  . COPD   Subjective:   Patient saw [name] for AWV toda y at 8:40 am.   Complete Physical Toni White is a 82 y.o. female. She feels well. She reports exercising none. She reports she is sleeping well.  -----------------------------------------------------------   Hypertension, follow-up:  BP Readings from Last 3 Encounters:  12/11/17 124/68  06/08/17 126/74  05/28/17 (!) 150/90    She was last seen for hypertension 6 months ago.  BP at that visit was 126/74. Management since that visit includes;no changes.She reports good compliance with treatment. She is not having side effects. none She is not exercising. She is adherent to low salt diet.   Outside blood pressures are not checking. She is experiencing none.  Patient denies none.   Cardiovascular risk factors include advanced age (older than 70 for men, 82 for women).  Use of agents associated with hypertension: none.   ----------------------------------------------------------------   Lipid/Cholesterol, Follow-up:   Last seen for this 6 months ago.  Management since that visit includes; no changes.  Last Lipid Panel:    Component Value Date/Time   CHOL 161 12/06/2016 0936   TRIG 156 (H) 12/06/2016 0936   HDL 45 12/06/2016 0936   CHOLHDL 3.6 12/06/2016 0936   LDLCALC 85 12/06/2016 0936    She reports good compliance with treatment. She is having side effects. none  Wt Readings from Last 3 Encounters:  12/11/17 165 lb 9.6 oz (75.1 kg)  06/08/17 164 lb (74.4 kg)  05/28/17 169 lb (76.7 kg)    ------------------------------------------------------------   Chronic obstructive pulmonary disease, unspecified COPD type ([place]) From 06/08/2017-no changes. She states her  breathing has been very good on current regiment of inhalers and she continues to follow up with Dr. [name] twice a year. she only requires oxygen at night.  Pre-diabetes From 06/08/2017-no changes. Hb A1C 5.7.  Osteoporosis  From 12/14/2016-last BMD showed slightly worsened. Started alendronate 75 mg qd once weekly. She reports she is taking consistently and remains effective.   Review of Systems  Constitutional: Negative for chills, diaphoresis and fever.  HENT: Negative for congestion, ear discharge, ear pain, hearing loss, nosebleeds, sore throat and tinnitus.   Eyes: Negative for photophobia, pain, discharge and redness.  Respiratory: Negative for cough, shortness of breath, wheezing and stridor.   Cardiovascular: Negative for chest pain, palpitations and leg swelling.  Gastrointestinal: Negative for abdominal pain, blood in stool, constipation, diarrhea, nausea and vomiting.  Endocrine: Negative for polydipsia.  Genitourinary: Negative for dysuria, flank pain, frequency, hematuria and urgency.  Musculoskeletal: Negative for back pain, myalgias and neck pain.  Skin: Negative for rash.  Allergic/Immunologic: Negative for environmental allergies.  Neurological: Negative for dizziness, tremors, seizures, weakness and headaches.  Hematological: Does not bruise/bleed easily.  Psychiatric/Behavioral: Negative for hallucinations and suicidal ideas. The patient is not nervous/anxious.   All other systems reviewed and are negative.   Social History   Socioeconomic History  . Marital status: Widowed    Spouse name: Not on file  . Number of children: 3  . Years of education: Not on file  . Highest education level: 12th grade  Occupational History  . Occupation: Retired    Comment: Worked in Education officer, community  .  Financial resource strain: Not hard at all  . Food insecurity:    Worry: Never true    Inability: Never true  . Transportation needs:    Medical: No    Non-medical:  No  Tobacco Use  . Smoking status: Former Smoker    Packs/day: 1.00    Years: 30.00    Pack years: 30.00    Types: Cigarettes  . Smokeless tobacco: Never Used  . Tobacco comment: quit around 1989  Substance and Sexual Activity  . Alcohol use: No    Alcohol/week: 0.0 oz  . Drug use: No  . Sexual activity: Not on file  Lifestyle  . Physical activity:    Days per week: Not on file    Minutes per session: Not on file  . Stress: Not at all  Relationships  . Social connections:    Talks on phone: Not on file    Gets together: Not on file    Attends religious service: Not on file    Active member of club or organization: Not on file    Attends meetings of clubs or organizations: Not on file    Relationship status: Not on file  . Intimate partner violence:    Fear of current or ex partner: Not on file    Emotionally abused: Not on file    Physically abused: Not on file    Forced sexual activity: Not on file  Other Topics Concern  . Not on file  Social History Narrative  . Not on file    Past Medical History:  Diagnosis Date  . COPD (chronic obstructive pulmonary disease) ([place])   . History of chicken pox   . Hyperlipidemia   . Hypertension   . Ulcer      Patient Active Problem List   Diagnosis Date Noted  . Varicose veins of both legs with edema 05/02/2017  . Recurrent major depressive disorder, in partial remission ([place]) 06/08/2015  . Hypersomnolence 06/08/2015  . Macular degeneration 06/08/2015  . Sciatica 06/08/2015  . LBP (low back pain) 06/08/2015  . OP (osteoporosis) 06/08/2015  . Bibasilar crackles 06/08/2015  . Edema extremities 06/08/2015  . Tremor 03/17/2015  . 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 Diffuse cystic mastopathy 06/26/2013  . Myalgia and myositis 07/06/2009  . Hypoxemia 07/06/2009  . Hyperlipidemia 05/19/2008  . Prediabetes 12/18/2007  . One eye: total vision impairment; other eye: normal vision 06/29/2007  . Essential (primary)  hypertension 08/07/1998  . Chronic obstructive pulmonary disease ([place]) 08/07/1998    Past Surgical History:  Procedure Laterality Date  . ABDOMINAL HYSTERECTOMY  2003   with BSO due to bleeding  . APPENDECTOMY    . BREAST BIOPSY Left 2005   stereo breast biopsy   . BREAST BIOPSY Right 2005   core bx. benign  . BREAST BIOPSY Left 2000?   benign  . BREAST CYST EXCISION Left 1990   incision and draniage of abscess  . CHOLECYSTECTOMY    . COLONOSCOPY  2003   Dr. Vira Agar. Hyperplastic polyps; Single polyp excision from rectum  . CT Scan of Brain  08/20/2013   Mild diffuse cortical atrophy. Mid chronic ischemic white matter disease. Wide neck basilar tip aneurysm 6x6x26mm on follow up CTA  . [place]  . INCISION AND DRAINAGE BREAST ABSCESS Left 1990  . TONSILLECTOMY  1952  . UPPER GI ENDOSCOPY  02/26/2002   Dr. Tiffany Kocher; Gastritis. Single gastric ectasia    Her family history includes Cancer  in her brother; Congestive Heart Failure in her father and mother; Diabetes in her daughter and daughter; Heart disease in her father and mother; Lung cancer in her brother.      Current Outpatient Medications:  .  albuterol (VENTOLIN HFA) 108 (90 BASE) MCG/ACT inhaler, Inhale 2 puffs into the lungs. Every 4-6 hours as needed, Disp: , Rfl:  .  alendronate (FOSAMAX) 70 MG tablet, Take 1 tablet (70 mg total) by mouth every 7 (seven) days. Take with a full glass of water on an empty stomach., Disp: 12 tablet, Rfl: 4 .  amLODipine (NORVASC) 2.5 MG tablet, TAKE 1 TABLET EVERY DAY, Disp: 90 tablet, Rfl: 4 .  aspirin 81 MG tablet, Take 81 mg by mouth daily., Disp: , Rfl:  .  Calcium-Magnesium-Vitamin D (CALCIUM 500 PO), Take 1 tablet by mouth 2 (two) times daily., Disp: , Rfl:  .  citalopram (CELEXA) 20 MG tablet, TAKE 1 TABLET EVERY DAY, Disp: , Rfl:  .  Fluticasone-Salmeterol (ADVAIR) 250-50 MCG/DOSE AEPB, Inhale 1 puff into the lungs 2 (two) times daily., Disp: , Rfl:  .  furosemide  (LASIX) 20 MG tablet, TAKE 1 TABLET TWICE DAILY AS NEEDED  FOR  SWELLING, Disp: 90 tablet, Rfl: 3 .  gabapentin (NEURONTIN) 100 MG capsule, Take 1 capsule by mouth 2 (two) times daily., Disp: , Rfl:  .  levothyroxine (SYNTHROID, LEVOTHROID) 25 MCG tablet, TAKE 1 AND 1/2 TABLETS ONE TIME DAILY Take on an empty stomach with a glass of water at least 30-60 minutes before breakfast., Disp: , Rfl:  .  meloxicam (MOBIC) 15 MG tablet, TAKE ONE TABLET BY MOUTH EVERY DAY, Disp: 30 tablet, Rfl: 5 .  metoprolol succinate (TOPROL-XL) 50 MG 24 hr tablet, TAKE 1/2 TABLET EVERY DAY, Disp: 45 tablet, Rfl: 5 .  Multiple Vitamins-Minerals (PRESERVISION AREDS 2+MULTI VIT) CAPS, Take 1 capsule by mouth 2 (two) times daily., Disp: , Rfl:  .  OXYGEN, Inhale into the lungs Nightly., Disp: , Rfl:  .  pravastatin (PRAVACHOL) 40 MG tablet, Take 1 tablet (40 mg total) by mouth at bedtime., Disp: 90 tablet, Rfl: 4 .  ranitidine (ZANTAC) 300 MG tablet, TAKE 1 TABLET AT BEDTIME, Disp: 90 tablet, Rfl: 3 .  roflumilast (DALIRESP) 500 MCG TABS tablet, Take 500 mcg by mouth daily., Disp: , Rfl:  .  tiotropium (SPIRIVA) 18 MCG inhalation capsule, Place 18 mcg into inhaler and inhale daily., Disp: , Rfl:  .  traMADol (ULTRAM) 50 MG tablet, TAKE ONE TABLET EVERY EIGHT HOURS AS NEEDED, Disp: 30 tablet, Rfl: 5  Patient Care Team: Birdie Sons, MD as PCP - General (Family Medicine) Erby Pian, MD as Referring Physician (Specialist) Ubaldo Glassing Javier Docker, MD as Consulting Physician (Cardiology) Anabel Bene, MD as Referring Physician (Neurology) Dingeldein, Remo Lipps, MD as Consulting Physician (Ophthalmology) Dasher, Rayvon Char, MD as Consulting Physician (Dermatology)     Objective:   Vitals:   BP 124/68 (BP Location: Right Arm)    Pulse 68  3  Temp 98 F (36.7 C) (Oral)    Ht 5\' 1"  (1.549 m)    Wt 165 lb 9.6 oz (75.1 kg)    BMI 31.29 kg/m    BSA 1.8 m    Physical Exam   General Appearance:    Alert,  cooperative, no distress, appears stated age  Head:    Normocephalic, without obvious abnormality, atraumatic  Eyes:    PERRL, conjunctiva/corneas clear, EOM's intact, fundi    benign, both eyes  Ears:  Normal TM's and external ear canals, both ears  Nose:   Nares normal, septum midline, mucosa normal, no drainage    or sinus tenderness  Throat:   Lips, mucosa, and tongue normal; teeth and gums normal  Neck:   Supple, symmetrical, trachea midline, no adenopathy;    thyroid:  no enlargement/tenderness/nodules; no carotid   bruit or JVD  Back:     Symmetric, no curvature, ROM normal, no CVA tenderness  Lungs:     Clear to auscultation bilaterally, respirations unlabored  Chest Wall:    No tenderness or deformity   Heart:    Regular rate and rhythm, S1 and S2 normal, no murmur, rub   or gallop  Breast Exam:    deferred  Abdomen:     Soft, non-tender, bowel sounds active all four quadrants,    no masses, no organomegaly  Pelvic:    deferred  Extremities:   Extremities normal, atraumatic, no cyanosis or edema  Pulses:   2+ and symmetric all extremities  Skin:   Skin color, texture, turgor normal, no rashes or lesions  Lymph nodes:   Cervical, supraclavicular, and axillary nodes normal  Neurologic:   CNII-XII intact, normal strength, sensation and reflexes    throughout    Activities of Daily Living In your present state of health, do you have any difficulty performing the following activities: 12/11/2017  Hearing? Y  Comment Has a left hearing aid- does not wear it.   Vision? 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  Difficulty concentrating or making decisions? N  Walking or climbing stairs? Y  Comment Afraid of steps, has SOB with climbing steps.   Dressing or bathing? N  Doing errands, shopping? Y  Comment Does not drive currently.   Preparing Food and eating ? N  Using the Toilet? N  In the past six months, have you accidently leaked urine? N  Do you have  problems with loss of bowel control? N  Managing your Medications? N  Managing your Finances? N  Housekeeping or managing your Housekeeping? Y  Comment Daughter cleans home for pt.   Some recent data might be hidden    Fall Risk Assessment Fall Risk  12/11/2017 12/06/2016 12/06/2016 06/09/2015  Falls in the past year? Yes Yes Yes No  Number falls in past yr: 1 1 1  -  Injury with Fall? Yes Yes Yes -  Comment fractured back broken right foot - -  Follow up Falls prevention discussed Falls prevention discussed Falls prevention discussed -     Depression Screen PHQ 2/9 Scores 12/11/2017 12/11/2017 12/06/2016 12/06/2016  PHQ - 2 Score 0 0 0 0  PHQ- 9 Score 3 - - 2       Assessment & Plan:    Annual Physical Reviewed patient's Family Medical History Reviewed and updated list of patient's medical providers Assessment of cognitive impairment was done Assessed patient's functional ability Established a written schedule for health screening [place] Completed and Reviewed  Exercise Activities and Dietary recommendations Goals    . DIET - INCREASE WATER INTAKE     Recommend increasing water intake to 4-6 glasses a day.        Immunization History  Administered Date(s) Administered  . Influenza Split 04/16/2010, 04/22/2011, 04/23/2012, 06/01/2015  . Influenza, High Dose Seasonal PF 04/29/2016, 05/02/2017  . Influenza,inj,Quad PF,6+ Mos 04/09/2013, 04/09/2014  . Pneumococcal Conjugate-13 01/14/2014  . Pneumococcal Polysaccharide-23 08/07/2000, 03/13/2006, 06/29/2007  .  Tdap 04/22/2011  . Zoster 08/14/2006    Health Maintenance  Topic Date Due  . URINE MICROALBUMIN  04/11/1946  . FOOT EXAM  10/03/2017  . HEMOGLOBIN A1C  12/06/2017  . INFLUENZA VACCINE  03/07/2018  . OPHTHALMOLOGY EXAM  10/30/2018  . TETANUS/TDAP  04/21/2021  . DEXA SCAN  Completed  . PNA vac Low Risk Adult  Completed     Discussed health benefits of physical activity, and encouraged her to  engage in regular exercise appropriate for her age and condition.    ------------------------------------------------------------------------------------------------------------  1. Annual physical exam   2. BMI 31.0-31.9,adult Counseled regarding prudent diet and regular exercise.    3. Essential (primary) hypertension Well controlled.  Continue current medications.   - Comprehensive metabolic panel  4. Chronic obstructive pulmonary disease, unspecified COPD type ([place]) Well controlled.  The current medical regimen is effective;  continue present plan and medications.  5. Osteoporosis, unspecified osteoporosis type, unspecified pathological fracture presence Doing well on alendronate  6. Hyperlipidemia, unspecified hyperlipidemia type  - Comprehensive metabolic panel - TSH - Lipid panel - CBC  7. Prediabetes  - Hemoglobin A1c   Lelon Huh, MD  [facility]

## 2017-12-11 ENCOUNTER — Ambulatory Visit (INDEPENDENT_AMBULATORY_CARE_PROVIDER_SITE_OTHER): Payer: Medicare HMO | Admitting: Family Medicine

## 2017-12-11 ENCOUNTER — Ambulatory Visit (INDEPENDENT_AMBULATORY_CARE_PROVIDER_SITE_OTHER): Payer: Medicare HMO

## 2017-12-11 VITALS — BP 124/68 | HR 68 | Temp 98.0°F | Ht 61.0 in | Wt 165.6 lb

## 2017-12-11 DIAGNOSIS — R7303 Prediabetes: Secondary | ICD-10-CM

## 2017-12-11 DIAGNOSIS — E785 Hyperlipidemia, unspecified: Secondary | ICD-10-CM

## 2017-12-11 DIAGNOSIS — I10 Essential (primary) hypertension: Secondary | ICD-10-CM

## 2017-12-11 DIAGNOSIS — Z6831 Body mass index (BMI) 31.0-31.9, adult: Secondary | ICD-10-CM

## 2017-12-11 DIAGNOSIS — M810 Age-related osteoporosis without current pathological fracture: Secondary | ICD-10-CM | POA: Diagnosis not present

## 2017-12-11 DIAGNOSIS — Z0000 Encounter for general adult medical examination without abnormal findings: Secondary | ICD-10-CM | POA: Diagnosis not present

## 2017-12-11 DIAGNOSIS — J449 Chronic obstructive pulmonary disease, unspecified: Secondary | ICD-10-CM | POA: Diagnosis not present

## 2017-12-11 NOTE — Patient Instructions (Addendum)
Toni White , Thank you for taking time to come for your Medicare Wellness Visit. I appreciate your ongoing commitment to your health goals. Please review the following plan we discussed and let me know if I can assist you in the future.   Screening recommendations/referrals: Colonoscopy: Up to date Mammogram: Up to date Bone Density: Up to date Recommended yearly ophthalmology/optometry visit for glaucoma screening and checkup Recommended yearly dental visit for hygiene and checkup  Vaccinations: Influenza vaccine: Up to date Pneumococcal vaccine: Up to date Tdap vaccine: Up to date Shingles vaccine: Pt declines today.     Advanced directives: Please bring a copy of your POA (Power of Attorney) and/or Living Will to your next appointment.   Conditions/risks identified: Fall risk prevention; Obesity; Recommend increasing water intake to 4-6 glasses a day.   Next appointment: 9:20 AM today with Dr Caryn Section.   Preventive Care 42 Years and Older, Female Preventive care refers to lifestyle choices and visits with your health care provider that can promote health and wellness. What does preventive care include?  A yearly physical exam. This is also called an annual well check.  Dental exams once or twice a year.  Routine eye exams. Ask your health care provider how often you should have your eyes checked.  Personal lifestyle choices, including:  Daily care of your teeth and gums.  Regular physical activity.  Eating a healthy diet.  Avoiding tobacco and drug use.  Limiting alcohol use.  Practicing safe sex.  Taking low-dose aspirin every day.  Taking vitamin and mineral supplements as recommended by your health care provider. What happens during an annual well check? The services and screenings done by your health care provider during your annual well check will depend on your age, overall health, lifestyle risk factors, and family history of disease. Counseling  Your  health care provider may ask you questions about your:  Alcohol use.  Tobacco use.  Drug use.  Emotional well-being.  Home and relationship well-being.  Sexual activity.  Eating habits.  History of falls.  Memory and ability to understand (cognition).  Work and work Statistician.  Reproductive health. Screening  You may have the following tests or measurements:  Height, weight, and BMI.  Blood pressure.  Lipid and cholesterol levels. These may be checked every 5 years, or more frequently if you are over 27 years old.  Skin check.  Lung cancer screening. You may have this screening every year starting at age 68 if you have a 30-pack-year history of smoking and currently smoke or have quit within the past 15 years.  Fecal occult blood test (FOBT) of the stool. You may have this test every year starting at age 10.  Flexible sigmoidoscopy or colonoscopy. You may have a sigmoidoscopy every 5 years or a colonoscopy every 10 years starting at age 41.  Hepatitis C blood test.  Hepatitis B blood test.  Sexually transmitted disease (STD) testing.  Diabetes screening. This is done by checking your blood sugar (glucose) after you have not eaten for a while (fasting). You may have this done every 1-3 years.  Bone density scan. This is done to screen for osteoporosis. You may have this done starting at age 72.  Mammogram. This may be done every 1-2 years. Talk to your health care provider about how often you should have regular mammograms. Talk with your health care provider about your test results, treatment options, and if necessary, the need for more tests. Vaccines  Your health care  provider may recommend certain vaccines, such as:  Influenza vaccine. This is recommended every year.  Tetanus, diphtheria, and acellular pertussis (Tdap, Td) vaccine. You may need a Td booster every 10 years.  Zoster vaccine. You may need this after age 47.  Pneumococcal 13-valent  conjugate (PCV13) vaccine. One dose is recommended after age 31.  Pneumococcal polysaccharide (PPSV23) vaccine. One dose is recommended after age 50. Talk to your health care provider about which screenings and vaccines you need and how often you need them. This information is not intended to replace advice given to you by your health care provider. Make sure you discuss any questions you have with your health care provider. Document Released: 08/20/2015 Document Revised: 04/12/2016 Document Reviewed: 05/25/2015 Elsevier Interactive Patient Education  2017 [place] Prevention in the Home Falls can cause injuries. They can happen to people of all ages. There are many things you can do to make your home safe and to help prevent falls. What can I do on the outside of my home?  Regularly fix the edges of walkways and driveways and fix any cracks.  Remove anything that might make you trip as you walk through a door, such as a raised step or threshold.  Trim any bushes or trees on the path to your home.  Use bright outdoor lighting.  Clear any walking paths of anything that might make someone trip, such as rocks or tools.  Regularly check to see if handrails are loose or broken. Make sure that both sides of any steps have handrails.  Any raised decks and porches should have guardrails on the edges.  Have any leaves, snow, or ice cleared regularly.  Use sand or salt on walking paths during winter.  Clean up any spills in your garage right away. This includes oil or grease spills. What can I do in the bathroom?  Use night lights.  Install grab bars by the toilet and in the tub and shower. Do not use towel bars as grab bars.  Use non-skid mats or decals in the tub or shower.  If you need to sit down in the shower, use a plastic, non-slip stool.  Keep the floor dry. Clean up any water that spills on the floor as soon as it happens.  Remove soap buildup in the tub or  shower regularly.  Attach bath mats securely with double-sided non-slip rug tape.  Do not have throw rugs and other things on the floor that can make you trip. What can I do in the bedroom?  Use night lights.  Make sure that you have a light by your bed that is easy to reach.  Do not use any sheets or blankets that are too big for your bed. They should not hang down onto the floor.  Have a firm chair that has side arms. You can use this for support while you get dressed.  Do not have throw rugs and other things on the floor that can make you trip. What can I do in the kitchen?  Clean up any spills right away.  Avoid walking on wet floors.  Keep items that you use a lot in easy-to-reach places.  If you need to reach something above you, use a strong step stool that has a grab bar.  Keep electrical cords out of the way.  Do not use floor polish or wax that makes floors slippery. If you must use wax, use non-skid floor wax.  Do not have throw  rugs and other things on the floor that can make you trip. What can I do with my stairs?  Do not leave any items on the stairs.  Make sure that there are handrails on both sides of the stairs and use them. Fix handrails that are broken or loose. Make sure that handrails are as long as the stairways.  Check any carpeting to make sure that it is firmly attached to the stairs. Fix any carpet that is loose or worn.  Avoid having throw rugs at the top or bottom of the stairs. If you do have throw rugs, attach them to the floor with carpet tape.  Make sure that you have a light switch at the top of the stairs and the bottom of the stairs. If you do not have them, ask someone to add them for you. What else can I do to help prevent falls?  Wear shoes that:  Do not have high heels.  Have rubber bottoms.  Are comfortable and fit you well.  Are closed at the toe. Do not wear sandals.  If you use a stepladder:  Make sure that it is fully  opened. Do not climb a closed stepladder.  Make sure that both sides of the stepladder are locked into place.  Ask someone to hold it for you, if possible.  Clearly mark and make sure that you can see:  Any grab bars or handrails.  First and last steps.  Where the edge of each step is.  Use tools that help you move around (mobility aids) if they are needed. These include:  Canes.  Walkers.  Scooters.  Crutches.  Turn on the lights when you go into a dark area. Replace any light bulbs as soon as they burn out.  Set up your furniture so you have a clear path. Avoid moving your furniture around.  If any of your floors are uneven, fix them.  If there are any pets around you, be aware of where they are.  Review your medicines with your doctor. Some medicines can make you feel dizzy. This can increase your chance of falling. Ask your doctor what other things that you can do to help prevent falls. This information is not intended to replace advice given to you by your health care provider. Make sure you discuss any questions you have with your health care provider. Document Released: 05/20/2009 Document Revised: 12/30/2015 Document Reviewed: 08/28/2014 Elsevier Interactive Patient Education  2017 [employer].

## 2017-12-11 NOTE — Patient Instructions (Addendum)
   The CDC recommends two doses of Shingrix (the shingles vaccine) separated by 2 to 6 months for adults age 80 years and older. I recommend checking with your pharmacy plan regarding coverage for this vaccine.     

## 2017-12-11 NOTE — Progress Notes (Signed)
Subjective:   Toni White is a 82 y.o. female who presents for Medicare Annual (Subsequent) preventive examination.  Review of Systems:  N/A  Cardiac Risk Factors include: advanced age (>81men, >11 women);dyslipidemia;hypertension;obesity (BMI >30kg/m2)     Objective:     Vitals: BP 124/68 (BP Location: Right Arm)   Pulse 68   Temp 98 F (36.7 C) (Oral)   Ht 5\' 1"  (1.549 m)   Wt 165 lb 9.6 oz (75.1 kg)   BMI 31.29 kg/m   Body mass index is 31.29 kg/m.  Advanced Directives 12/11/2017 12/06/2016  Does Patient Have a Medical Advance Directive? Yes Yes  Type of Advance Directive Living will [place];Living will  Copy of [place] in Chart? - No - copy requested    Tobacco Social History   Tobacco Use  Smoking Status Former Smoker  . Packs/day: 1.00  . Years: 30.00  . Pack years: 30.00  . Types: Cigarettes  Smokeless Tobacco Never Used  Tobacco Comment   quit around 1989     Counseling given: Not Answered Comment: quit around 1989   Clinical Intake:  Pre-visit preparation completed: Yes  Pain : No/denies pain Pain Score: 0-No pain     Nutritional Status: BMI > 30  Obese Nutritional Risks: None Diabetes: No  How often do you need to have someone help you when you read instructions, pamphlets, or other written materials from your doctor or pharmacy?: 2 - Rarely  Interpreter Needed?: No  Information entered by :: [facility], LPN  Past Medical History:  Diagnosis Date  . COPD (chronic obstructive pulmonary disease) ([place])   . History of chicken pox   . Hyperlipidemia   . Hypertension   . Ulcer    Past Surgical History:  Procedure Laterality Date  . ABDOMINAL HYSTERECTOMY  2003   with BSO due to bleeding  . APPENDECTOMY    . BREAST BIOPSY Left 2005   stereo breast biopsy   . BREAST BIOPSY Right 2005   core bx. benign  . BREAST BIOPSY Left 2000?   benign  . BREAST CYST EXCISION Left 1990   incision and  draniage of abscess  . CHOLECYSTECTOMY    . COLONOSCOPY  2003   Dr. Vira Agar. Hyperplastic polyps; Single polyp excision from rectum  . CT Scan of Brain  08/20/2013   Mild diffuse cortical atrophy. Mid chronic ischemic white matter disease. 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 [place]  . INCISION AND DRAINAGE BREAST ABSCESS Left 1990  . TONSILLECTOMY  1952  . UPPER GI ENDOSCOPY  02/26/2002   Dr. Tiffany Kocher; Gastritis. Single gastric ectasia   Family History  Problem Relation Age of Onset  . Heart disease Father   . Congestive Heart Failure Father   . Heart disease Mother   . Congestive Heart Failure Mother   . Cancer Brother        lung  . Lung cancer Brother   . Diabetes Daughter   . Diabetes Daughter    Social History   Socioeconomic History  . Marital status: Widowed    Spouse name: Not on file  . Number of children: 3  . Years of education: Not on file  . Highest education level: 12th grade  Occupational History  . Occupation: Retired    Comment: Worked in Education officer, community  . Financial resource strain: Not hard at all  . Food insecurity:  Worry: Never true    Inability: Never true  . Transportation needs:    Medical: No    Non-medical: No  Tobacco Use  . Smoking status: Former Smoker    Packs/day: 1.00    Years: 30.00    Pack years: 30.00    Types: Cigarettes  . Smokeless tobacco: Never Used  . Tobacco comment: quit around 1989  Substance and Sexual Activity  . Alcohol use: No    Alcohol/week: 0.0 oz  . Drug use: No  . Sexual activity: Not on file  Lifestyle  . Physical activity:    Days per week: Not on file    Minutes per session: Not on file  . Stress: Not at all  Relationships  . Social connections:    Talks on phone: Not on file    Gets together: Not on file    Attends religious service: Not on file    Active member of club or organization: Not on file    Attends meetings of clubs or  organizations: Not on file    Relationship status: Not on file  Other Topics Concern  . Not on file  Social History Narrative  . Not on file    Outpatient Encounter Medications as of 12/11/2017  Medication Sig  . albuterol (VENTOLIN HFA) 108 (90 BASE) MCG/ACT inhaler Inhale 2 puffs into the lungs. Every 4-6 hours as needed  . alendronate (FOSAMAX) 70 MG tablet Take 1 tablet (70 mg total) by mouth every 7 (seven) days. Take with a full glass of water on an empty stomach.  Marland Kitchen amLODipine (NORVASC) 2.5 MG tablet TAKE 1 TABLET EVERY DAY  . aspirin 81 MG tablet Take 81 mg by mouth daily.  . Calcium-Magnesium-Vitamin D (CALCIUM 500 PO) Take 1 tablet by mouth 2 (two) times daily.  . citalopram (CELEXA) 20 MG tablet TAKE 1 TABLET EVERY DAY  . Fluticasone-Salmeterol (ADVAIR) 250-50 MCG/DOSE AEPB Inhale 1 puff into the lungs 2 (two) times daily.  . furosemide (LASIX) 20 MG tablet TAKE 1 TABLET TWICE DAILY AS NEEDED  FOR  SWELLING  . gabapentin (NEURONTIN) 100 MG capsule Take 1 capsule by mouth 2 (two) times daily.  Marland Kitchen levothyroxine (SYNTHROID, LEVOTHROID) 25 MCG tablet TAKE 1 AND 1/2 TABLETS ONE TIME DAILY Take on an empty stomach with a glass of water at least 30-60 minutes before breakfast.  . meloxicam (MOBIC) 15 MG tablet TAKE ONE TABLET BY MOUTH EVERY DAY  . metoprolol succinate (TOPROL-XL) 50 MG 24 hr tablet TAKE 1/2 TABLET EVERY DAY  . Multiple Vitamins-Minerals (PRESERVISION AREDS 2+MULTI VIT) CAPS Take 1 capsule by mouth 2 (two) times daily.  . OXYGEN Inhale into the lungs Nightly.  . pravastatin (PRAVACHOL) 40 MG tablet Take 1 tablet (40 mg total) by mouth at bedtime.  . ranitidine (ZANTAC) 300 MG tablet TAKE 1 TABLET AT BEDTIME  . tiotropium (SPIRIVA) 18 MCG inhalation capsule Place 18 mcg into inhaler and inhale daily.  . traMADol (ULTRAM) 50 MG tablet TAKE ONE TABLET EVERY EIGHT HOURS AS NEEDED  . roflumilast (DALIRESP) 500 MCG TABS tablet Take 500 mcg by mouth daily.   No  facility-administered encounter medications on file as of 12/11/2017.     Activities of Daily Living In your present state of health, do you have any difficulty performing the following activities: 12/11/2017  Hearing? Y  Comment Has a left hearing aid- does not wear it.   Vision? 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  Difficulty concentrating or making decisions? N  Walking or climbing stairs? Y  Comment Afraid of steps, has SOB with climbing steps.   Dressing or bathing? N  Doing errands, shopping? Y  Comment Does not drive currently.   Preparing Food and eating ? N  Using the Toilet? N  In the past six months, have you accidently leaked urine? N  Do you have problems with loss of bowel control? N  Managing your Medications? N  Managing your Finances? N  Housekeeping or managing your Housekeeping? Y  Comment Daughter cleans home for pt.   Some recent data might be hidden    Patient Care Team: Birdie Sons, MD as PCP - General (Family Medicine) Erby Pian, MD as Referring Physician (Specialist) Teodoro Spray, MD as Consulting Physician (Cardiology) Anabel Bene, MD as Referring Physician (Neurology) Estill Cotta, MD as Consulting Physician (Ophthalmology) Dasher, Rayvon Char, MD as Consulting Physician (Dermatology)    Assessment:   This is a routine wellness examination for [place].  Exercise Activities and Dietary recommendations Current Exercise Habits: The patient does not participate in regular exercise at present, Exercise limited by: None identified  Goals    . DIET - INCREASE WATER INTAKE     Recommend increasing water intake to 4-6 glasses a day.        Fall Risk Fall Risk  12/11/2017 12/06/2016 12/06/2016 06/09/2015  Falls in the past year? Yes Yes Yes No  Number falls in past yr: 1 1 1  -  Injury with Fall? Yes Yes Yes -  Comment fractured back broken right foot - -  Follow up Falls prevention discussed Falls prevention  discussed Falls prevention discussed -   Is the patient's home free of loose throw rugs in walkways, pet beds, electrical cords, etc?   yes      Grab bars in the bathroom? yes      Handrails on the stairs?   yes      Adequate lighting?   yes  Timed Get Up and Go performed: N/A  Depression Screen PHQ 2/9 Scores 12/11/2017 12/11/2017 12/06/2016 12/06/2016  PHQ - 2 Score 0 0 0 0  PHQ- 9 Score 3 - - 2     Cognitive Function:     6CIT Screen 12/11/2017 12/06/2016  What Year? 0 points 0 points  What month? 0 points 0 points  What time? 0 points 0 points  Count back from 20 0 points 0 points  Months in reverse 0 points 0 points  Repeat phrase 0 points 2 points  Total Score 0 2    Immunization History  Administered Date(s) Administered  . Influenza Split 04/16/2010, 04/22/2011, 04/23/2012, 06/01/2015  . Influenza, High Dose Seasonal PF 04/29/2016, 05/02/2017  . Influenza,inj,Quad PF,6+ Mos 04/09/2013, 04/09/2014  . Pneumococcal Conjugate-13 01/14/2014  . Pneumococcal Polysaccharide-23 08/07/2000, 03/13/2006, 06/29/2007  . Tdap 04/22/2011  . Zoster 08/14/2006    Qualifies for Shingles Vaccine? Due for Shingles vaccine. Declined my offer to administer today. Education has been provided regarding the importance of this vaccine. Pt has been advised to call her insurance company to determine her out of pocket expense. Advised she may also receive this vaccine at her local pharmacy or Health Dept. Verbalized acceptance and understanding.  Screening Tests Health Maintenance  Topic Date Due  . URINE MICROALBUMIN  04/11/1946  . FOOT EXAM  10/03/2017  . HEMOGLOBIN A1C  12/06/2017  . INFLUENZA VACCINE  03/07/2018  . OPHTHALMOLOGY EXAM  10/30/2018  .  TETANUS/TDAP  04/21/2021  . DEXA SCAN  Completed  . PNA vac Low Risk Adult  Completed    Cancer Screenings: Lung: Low Dose CT Chest recommended if Age 60-80 years, 30 pack-year currently smoking OR have quit w/in 15years. Patient does not  qualify. Breast:  Up to date on Mammogram? Yes   Up to date of Bone Density/Dexa? Yes Colorectal: Up to date  Additional Screenings:  Hepatitis C Screening: N/A     Plan:  I have personally reviewed and addressed the Medicare Annual Wellness questionnaire and have noted the following in the patient's chart:  A. Medical and social history B. Use of alcohol, tobacco or illicit drugs  C. Current medications and supplements D. Functional ability and status E.  Nutritional status F.  Physical activity G. Advance directives H. List of other physicians I.  Hospitalizations, surgeries, and ER visits in previous 12 months J.  [place] such as hearing and vision if needed, cognitive and depression L. Referrals and appointments - none  In addition, I have reviewed and discussed with patient certain preventive protocols, quality metrics, and best practice recommendations. A written personalized care plan for preventive services as well as general preventive health recommendations were provided to patient.  See attached scanned questionnaire for additional information.   Signed,  Fabio Neighbors, LPN Nurse Health Advisor   Nurse Recommendations: Pt needs a diabetic foot exam, Hgb A1c and urine microalbumin checked today.

## 2017-12-12 LAB — COMPREHENSIVE METABOLIC PANEL
ALBUMIN: 4.1 g/dL (ref 3.5–4.8)
ALK PHOS: 66 IU/L (ref 39–117)
ALT: 17 IU/L (ref 0–32)
AST: 21 IU/L (ref 0–40)
Albumin/Globulin Ratio: 1.5 (ref 1.2–2.2)
BUN / CREAT RATIO: 20 (ref 12–28)
BUN: 17 mg/dL (ref 8–27)
Bilirubin Total: 0.5 mg/dL (ref 0.0–1.2)
CO2: 30 mmol/L — AB (ref 20–29)
Calcium: 10.6 mg/dL — ABNORMAL HIGH (ref 8.7–10.3)
Chloride: 96 mmol/L (ref 96–106)
Creatinine, Ser: 0.83 mg/dL (ref 0.57–1.00)
GFR, EST AFRICAN AMERICAN: 78 mL/min/{1.73_m2} (ref >59)
GFR, EST NON AFRICAN AMERICAN: 67 mL/min/{1.73_m2} (ref >59)
GLOBULIN, TOTAL: 2.8 g/dL (ref 1.5–4.5)
Glucose: 83 mg/dL (ref 65–99)
Potassium: 4.1 mmol/L (ref 3.5–5.2)
SODIUM: 140 mmol/L (ref 134–144)
Total Protein: 6.9 g/dL (ref 6.0–8.5)

## 2017-12-12 LAB — CBC
Hematocrit: 47.2 % — ABNORMAL HIGH (ref 34.0–46.6)
Hemoglobin: 15.4 g/dL (ref 11.1–15.9)
MCH: 29.8 pg (ref 26.6–33.0)
MCHC: 32.6 g/dL (ref 31.5–35.7)
MCV: 92 fL (ref 79–97)
PLATELETS: 272 10*3/uL (ref 150–379)
RBC: 5.16 x10E6/uL (ref 3.77–5.28)
RDW: 12.8 % (ref 12.3–15.4)
WBC: 8.9 10*3/uL (ref 3.4–10.8)

## 2017-12-12 LAB — LIPID PANEL
CHOL/HDL RATIO: 3.2 ratio (ref 0.0–4.4)
Cholesterol, Total: 148 mg/dL (ref 100–199)
HDL: 46 mg/dL (ref >39)
LDL CALC: 72 mg/dL (ref 0–99)
TRIGLYCERIDES: 148 mg/dL (ref 0–149)
VLDL CHOLESTEROL CAL: 30 mg/dL (ref 5–40)

## 2017-12-12 LAB — HEMOGLOBIN A1C
Est. average glucose Bld gHb Est-mCnc: 117 mg/dL
HEMOGLOBIN A1C: 5.7 % — AB (ref 4.8–5.6)

## 2017-12-12 LAB — TSH: TSH: 2.31 u[IU]/mL (ref 0.450–4.500)

## 2017-12-14 ENCOUNTER — Other Ambulatory Visit: Payer: Self-pay | Admitting: Family Medicine

## 2017-12-14 DIAGNOSIS — M810 Age-related osteoporosis without current pathological fracture: Secondary | ICD-10-CM

## 2017-12-19 ENCOUNTER — Emergency Department: Payer: Medicare HMO

## 2017-12-19 ENCOUNTER — Other Ambulatory Visit: Payer: Self-pay

## 2017-12-19 ENCOUNTER — Emergency Department
Admission: EM | Admit: 2017-12-19 | Discharge: 2017-12-19 | Disposition: A | Discharge status: Home / Self Care | Payer: Medicare HMO | Attending: Emergency Medicine | Admitting: Emergency Medicine

## 2017-12-19 ENCOUNTER — Encounter: Payer: Self-pay | Admitting: Emergency Medicine

## 2017-12-19 DIAGNOSIS — S0083XA Contusion of other part of head, initial encounter: Secondary | ICD-10-CM

## 2017-12-19 DIAGNOSIS — S61212A Laceration without foreign body of right middle finger without damage to nail, initial encounter: Secondary | ICD-10-CM

## 2017-12-19 DIAGNOSIS — S65512A Laceration of blood vessel of right middle finger, initial encounter: Secondary | ICD-10-CM | POA: Diagnosis not present

## 2017-12-19 DIAGNOSIS — Z23 Encounter for immunization: Secondary | ICD-10-CM | POA: Diagnosis not present

## 2017-12-19 DIAGNOSIS — Y929 Unspecified place or not applicable: Secondary | ICD-10-CM | POA: Insufficient documentation

## 2017-12-19 DIAGNOSIS — S32020A Wedge compression fracture of second lumbar vertebra, initial encounter for closed fracture: Secondary | ICD-10-CM | POA: Diagnosis not present

## 2017-12-19 DIAGNOSIS — Y939 Activity, unspecified: Secondary | ICD-10-CM | POA: Diagnosis not present

## 2017-12-19 DIAGNOSIS — M542 Cervicalgia: Secondary | ICD-10-CM | POA: Diagnosis not present

## 2017-12-19 DIAGNOSIS — J449 Chronic obstructive pulmonary disease, unspecified: Secondary | ICD-10-CM | POA: Insufficient documentation

## 2017-12-19 DIAGNOSIS — W0110XA Fall on same level from slipping, tripping and stumbling with subsequent striking against unspecified object, initial encounter: Secondary | ICD-10-CM | POA: Insufficient documentation

## 2017-12-19 DIAGNOSIS — S0990XA Unspecified injury of head, initial encounter: Secondary | ICD-10-CM | POA: Diagnosis not present

## 2017-12-19 DIAGNOSIS — Y999 Unspecified external cause status: Secondary | ICD-10-CM | POA: Diagnosis not present

## 2017-12-19 DIAGNOSIS — M25561 Pain in right knee: Secondary | ICD-10-CM | POA: Diagnosis not present

## 2017-12-19 DIAGNOSIS — I10 Essential (primary) hypertension: Secondary | ICD-10-CM | POA: Insufficient documentation

## 2017-12-19 DIAGNOSIS — M546 Pain in thoracic spine: Secondary | ICD-10-CM | POA: Diagnosis not present

## 2017-12-19 DIAGNOSIS — Z79899 Other long term (current) drug therapy: Secondary | ICD-10-CM | POA: Insufficient documentation

## 2017-12-19 DIAGNOSIS — S199XXA Unspecified injury of neck, initial encounter: Secondary | ICD-10-CM | POA: Diagnosis not present

## 2017-12-19 DIAGNOSIS — W19XXXA Unspecified fall, initial encounter: Secondary | ICD-10-CM

## 2017-12-19 DIAGNOSIS — Z87891 Personal history of nicotine dependence: Secondary | ICD-10-CM | POA: Insufficient documentation

## 2017-12-19 DIAGNOSIS — S0993XA Unspecified injury of face, initial encounter: Secondary | ICD-10-CM | POA: Diagnosis not present

## 2017-12-19 DIAGNOSIS — R51 Headache: Secondary | ICD-10-CM | POA: Diagnosis not present

## 2017-12-19 DIAGNOSIS — S32020S Wedge compression fracture of second lumbar vertebra, sequela: Secondary | ICD-10-CM

## 2017-12-19 DIAGNOSIS — Z7982 Long term (current) use of aspirin: Secondary | ICD-10-CM | POA: Insufficient documentation

## 2017-12-19 MED ORDER — TRAMADOL HCL 50 MG PO TABS
50.0000 mg | ORAL_TABLET | Freq: Once | ORAL | Status: AC
  Administered 2017-12-19: 50 mg via ORAL
  Filled 2017-12-19: qty 1

## 2017-12-19 MED ORDER — TETANUS-DIPHTH-ACELL PERTUSSIS 5-2.5-18.5 LF-MCG/0.5 IM SUSP
0.5000 mL | Freq: Once | INTRAMUSCULAR | Status: AC
  Administered 2017-12-19: 0.5 mL via INTRAMUSCULAR
  Filled 2017-12-19: qty 0.5

## 2017-12-19 MED ORDER — CEPHALEXIN 500 MG PO CAPS: 500.0000 mg | ORAL_CAPSULE | Freq: Four times a day (QID) | ORAL | 0 refills | Status: AC

## 2017-12-19 MED ORDER — LIDOCAINE HCL (PF) 1 % IJ SOLN
5.0000 mL | Freq: Once | INTRAMUSCULAR | Status: AC
  Administered 2017-12-19: 5 mL via INTRADERMAL
  Filled 2017-12-19: qty 5

## 2017-12-19 MED ORDER — TRAMADOL HCL 50 MG PO TABS: 25.0000 mg | ORAL_TABLET | Freq: Four times a day (QID) | ORAL | 0 refills | Status: AC | PRN

## 2017-12-19 NOTE — ED Triage Notes (Signed)
Brought over from [facility] s/p fall   Per staff she tripped and fell face first  Large hematoma to forehead ,right knee pain.Marland Kitchenalos laceration to right middle finger  And poss. Deformity noted to right 4 th finger  Denies any LOC  Family at bedside

## 2017-12-19 NOTE — ED Notes (Signed)
Pt ambulatory to POV without difficulty. VSS. NAD. Discharge instructions, RX and follow up discussed. All questions addressed.  

## 2017-12-19 NOTE — ED Notes (Signed)
First Nurse Note:  Patient to ED via stretcher from [facility] after fall face forward this AM.  Alert and oriented.  Swelling noted over right eye and left hand.  Ice packs applied.  Called Linda in [facility] for triage.  Patient to [facility].

## 2017-12-19 NOTE — ED Provider Notes (Signed)
[facility] Emergency Department Provider Note  ____________________________________________  Time seen: Approximately 9:38 AM  I have reviewed the triage vital signs and the nursing notes.   HISTORY  Chief Complaint Fall    HPI Toni White is a 82 y.o. female that presents to the emergency department for evaluation of headache, hand pain, knee pain after fall.  Patient was at [facility] clinic having an evaluation to switch insurances.  She tripped and fell face first. Pain is currently primarily over her right knee. She is also having pain over her right forehead but does not have a headache. She has a laceration to her right middle finger and and swelling over her two middle fingers.  Employees at [facility] witnessed fall and state that she tripped.  She denies dizziness, visual changes, confusion, shortness of breath, chest pain, nausea, vomiting, abdominal pain. She takes 81mg  aspirin.    Past Medical History:  Diagnosis Date  . COPD (chronic obstructive pulmonary disease) ([place])   . History of chicken pox   . Hyperlipidemia   . Hypertension   . Ulcer     Patient Active Problem List   Diagnosis Date Noted  . Varicose veins of both legs with edema 05/02/2017  . Recurrent major depressive disorder, in partial remission ([place]) 06/08/2015  . Hypersomnolence 06/08/2015  . Macular degeneration 06/08/2015  . Sciatica 06/08/2015  . LBP (low back pain) 06/08/2015  . OP (osteoporosis) 06/08/2015  . Bibasilar crackles 06/08/2015  . Edema extremities 06/08/2015  . Tremor 03/17/2015  . 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 Diffuse cystic mastopathy 06/26/2013  . Myalgia and myositis 07/06/2009  . Hypoxemia 07/06/2009  . Hyperlipidemia 05/19/2008  . Prediabetes 12/18/2007  . One eye: total vision impairment; other eye: normal vision 06/29/2007  . Essential (primary) hypertension 08/07/1998  . Chronic obstructive pulmonary disease ([place])  08/07/1998    Past Surgical History:  Procedure Laterality Date  . ABDOMINAL HYSTERECTOMY  2003   with BSO due to bleeding  . APPENDECTOMY    . BREAST BIOPSY Left 2005   stereo breast biopsy   . BREAST BIOPSY Right 2005   core bx. benign  . BREAST BIOPSY Left 2000?   benign  . BREAST CYST EXCISION Left 1990   incision and draniage of abscess  . CHOLECYSTECTOMY    . COLONOSCOPY  2003   Dr. Vira Agar. Hyperplastic polyps; Single polyp excision from rectum  . CT Scan of Brain  08/20/2013   Mild diffuse cortical atrophy. Mid chronic ischemic white matter disease. Wide neck basilar tip aneurysm 6x6x67mm on follow up CTA  . [place]  . INCISION AND DRAINAGE BREAST ABSCESS Left 1990  . TONSILLECTOMY  1952  . UPPER GI ENDOSCOPY  02/26/2002   Dr. Tiffany Kocher; Gastritis. Single gastric ectasia    Prior to Admission medications   Medication Sig Start Date End Date Taking? Authorizing Provider  albuterol (VENTOLIN HFA) 108 (90 BASE) MCG/ACT inhaler Inhale 2 puffs into the lungs. Every 4-6 hours as needed 08/31/11   [provider]  alendronate (FOSAMAX) 70 MG tablet TAKE 1 TABLET  EVERY 7 DAYS. TAKE WITH A FULL GLASS OF WATER ON AN EMPTY STOMACH. 12/14/17   Birdie Sons, MD  amLODipine (NORVASC) 2.5 MG tablet TAKE 1 TABLET EVERY DAY 11/08/17   Birdie Sons, MD  aspirin 81 MG tablet Take 81 mg by mouth daily.    [provider]  Calcium-Magnesium-Vitamin D (CALCIUM 500 PO) Take 1  tablet by mouth 2 (two) times daily. 06/14/07   [provider]  cephALEXin (KEFLEX) 500 MG capsule Take 1 capsule (500 mg total) by mouth 4 (four) times daily for 10 days. 12/19/17 12/29/17  Laban Emperor, PA-C  citalopram (CELEXA) 20 MG tablet TAKE 1 TABLET EVERY DAY 02/22/16   [provider]  Fluticasone-Salmeterol (ADVAIR) 250-50 MCG/DOSE AEPB Inhale 1 puff into the lungs 2 (two) times daily.    [provider]  furosemide (LASIX) 20 MG tablet TAKE 1  TABLET TWICE DAILY AS NEEDED  FOR  SWELLING 05/07/17   Birdie Sons, MD  gabapentin (NEURONTIN) 100 MG capsule Take 1 capsule by mouth 2 (two) times daily. 01/18/16   [provider]  levothyroxine (SYNTHROID, LEVOTHROID) 25 MCG tablet TAKE 1 AND 1/2 TABLETS ONE TIME DAILY Take on an empty stomach with a glass of water at least 30-60 minutes before breakfast. 01/18/16   [provider]  meloxicam (MOBIC) 15 MG tablet TAKE ONE TABLET BY MOUTH EVERY DAY 07/05/17   Birdie Sons, MD  metoprolol succinate (TOPROL-XL) 50 MG 24 hr tablet TAKE 1/2 TABLET EVERY DAY 11/22/16   Birdie Sons, MD  Multiple Vitamins-Minerals (PRESERVISION AREDS 2+MULTI VIT) CAPS Take 1 capsule by mouth 2 (two) times daily.    [provider]  OXYGEN Inhale into the lungs Nightly.    [provider]  pravastatin (PRAVACHOL) 40 MG tablet Take 1 tablet (40 mg total) by mouth at bedtime. 12/07/16   Birdie Sons, MD  ranitidine (ZANTAC) 300 MG tablet TAKE 1 TABLET AT BEDTIME 05/09/17   Birdie Sons, MD  roflumilast (DALIRESP) 500 MCG TABS tablet Take 500 mcg by mouth daily.    [provider]  tiotropium (SPIRIVA) 18 MCG inhalation capsule Place 18 mcg into inhaler and inhale daily.    [provider]  traMADol (ULTRAM) 50 MG tablet Take 0.5 tablets (25 mg total) by mouth every 6 (six) hours as needed. 12/19/17 12/19/18  Laban Emperor, PA-C    Allergies Flexeril  [cyclobenzaprine hcl]; Reglan [metoclopramide]; Relafen [nabumetone]; Risperdal  [risperidone]; Sulfa antibiotics; Tape; Triamterene-hctz; and Hctz [hydrochlorothiazide]  Family History  Problem Relation Age of Onset  . Heart disease Father   . Congestive Heart Failure Father   . Heart disease Mother   . Congestive Heart Failure Mother   . Cancer Brother        lung  . Lung cancer Brother   . Diabetes Daughter   . Diabetes Daughter     Social History Social History   Tobacco Use  . Smoking  status: Former Smoker    Packs/day: 1.00    Years: 30.00    Pack years: 30.00    Types: Cigarettes  . Smokeless tobacco: Never Used  . Tobacco comment: quit around 1989  Substance Use Topics  . Alcohol use: No    Alcohol/week: 0.0 oz  . Drug use: No     Review of Systems  Cardiovascular: No chest pain. Respiratory: No SOB. Gastrointestinal: No abdominal pain.  No nausea, no vomiting.  Musculoskeletal: See HPI Skin: Positive for ecchymosis and laceration.  Neurological: Negative for headaches, numbness or tingling   ____________________________________________   PHYSICAL EXAM:  VITAL SIGNS: ED Triage Vitals [12/19/17 0925]  Enc Vitals Group     BP      Pulse      Resp      Temp      Temp src      SpO2  Weight 165 lb (74.8 kg)     Height 5\' 1"  (1.549 m)     Head Circumference      Peak Flow      Pain Score 5     Pain Loc      Pain Edu?      Excl. in [place]?      Constitutional: Alert and oriented. Well appearing and in no acute distress. Eyes: Conjunctivae are normal. PERRL. EOMI. Head: Golfball size hematoma to right forehead. ENT:      Ears:      Nose: No congestion/rhinnorhea.      Mouth/Throat: Mucous membranes are moist.  Neck: No stridor.   Cardiovascular: Normal rate, regular rhythm.  Good peripheral circulation. Respiratory: Normal respiratory effort without tachypnea or retractions. Lungs CTAB. Good air entry to the bases with no decreased or absent breath sounds. Gastrointestinal: Bowel sounds 4 quadrants. Soft and nontender to palpation. No guarding or rigidity. No palpable masses. No distention.  Musculoskeletal: Full range of motion to all extremities. No gross deformities appreciated. Mild swelling to right knee. Hematoma to 3rd and 4th fingers. Weight bearing. Neurologic:  Normal speech and language. No gross focal neurologic deficits are appreciated.  Skin:  Skin is warm, dry and intact. 2 cm laceration to right middle finger Psychiatric:  Mood and affect are normal. Speech and behavior are normal. Patient exhibits appropriate insight and judgement.   ____________________________________________   LABS (all labs ordered are listed, but only abnormal results are displayed)  Labs Reviewed - No data to display ____________________________________________  EKG   ____________________________________________  RADIOLOGY Robinette Haines, personally viewed and evaluated these images (plain radiographs) as part of my medical decision making, as well as reviewing the written report by the radiologist.  Dg Thoracic Spine 2 View  Result Date: 12/19/2017 CLINICAL DATA:  [place].  Back pain. EXAM: THORACIC SPINE 2 VIEWS COMPARISON:  CT chest 06/30/2011.  Chest radiography 01/26/2014. FINDINGS: Curvature convex to the right with the apex at T7. No fracture seen in the thoracic region. Compression fracture at L2 with loss of height of about 50%, not visible in 2012 but of indeterminate age. Posteromedial ribs are negative. IMPRESSION: No acute thoracic finding. Chronic spinal curvature in degenerative changes. Compression fracture at L2 with loss of height about 50%. This is age indeterminate but possibly old. Electronically Signed   By: Nelson Chimes M.D.   On: 12/19/2017 10:38   Ct Head Wo Contrast  Result Date: 12/19/2017 CLINICAL DATA:  82 year old female with head, face and neck pain following fall. Initial encounter. EXAM: CT HEAD WITHOUT CONTRAST CT MAXILLOFACIAL WITHOUT CONTRAST CT CERVICAL SPINE WITHOUT CONTRAST TECHNIQUE: Multidetector CT imaging of the head, cervical spine, and maxillofacial structures were performed using the standard protocol without intravenous contrast. Multiplanar CT image reconstructions of the cervical spine and maxillofacial structures were also generated. COMPARISON:  01/26/2014 head CT and prior studies FINDINGS: CT HEAD FINDINGS Brain: No evidence of acute infarction, hemorrhage, hydrocephalus, extra-axial  collection or mass lesion/mass effect. A 6 mm basilar tip aneurysm does not appear significantly changed. Atrophy, chronic small-vessel white matter ischemic changes and remote infarcts/chronic is ischemia within bilateral basal ganglia/thalami again noted. Vascular: Atherosclerotic calcifications identified. Skull: Normal. Negative for fracture or focal lesion. Other: Moderate RIGHT forehead hematoma noted. CT MAXILLOFACIAL FINDINGS Osseous: No fracture or mandibular dislocation. No destructive process. Orbits: Negative. No traumatic or inflammatory finding. RIGHT lens calcifications noted. Sinuses: Clear. Soft tissues: Moderate RIGHT forehead hematoma noted. CT CERVICAL SPINE FINDINGS Alignment:  Normal. Skull base and vertebrae: No acute fracture. No primary bone lesion or focal pathologic process. Soft tissues and spinal canal: No prevertebral fluid or swelling. No visible canal hematoma. Disc levels: Mild multilevel degenerative disc disease and facet arthropathy from C3-C7 noted. Upper chest: No acute abnormality Other: None IMPRESSION: 1. Moderate RIGHT forehead hematoma without underlying fracture. 2. No evidence of acute intracranial abnormality. Atrophy and chronic small-vessel white matter ischemic changes. 3. No static evidence of acute injury to the cervical spine. 4. No acute facial bony injury. Electronically Signed   By: Margarette Canada M.D.   On: 12/19/2017 10:12   Ct Cervical Spine Wo Contrast  Result Date: 12/19/2017 CLINICAL DATA:  82 year old female with head, face and neck pain following fall. Initial encounter. EXAM: CT HEAD WITHOUT CONTRAST CT MAXILLOFACIAL WITHOUT CONTRAST CT CERVICAL SPINE WITHOUT CONTRAST TECHNIQUE: Multidetector CT imaging of the head, cervical spine, and maxillofacial structures were performed using the standard protocol without intravenous contrast. Multiplanar CT image reconstructions of the cervical spine and maxillofacial structures were also generated. COMPARISON:   01/26/2014 head CT and prior studies FINDINGS: CT HEAD FINDINGS Brain: No evidence of acute infarction, hemorrhage, hydrocephalus, extra-axial collection or mass lesion/mass effect. A 6 mm basilar tip aneurysm does not appear significantly changed. Atrophy, chronic small-vessel white matter ischemic changes and remote infarcts/chronic is ischemia within bilateral basal ganglia/thalami again noted. Vascular: Atherosclerotic calcifications identified. Skull: Normal. Negative for fracture or focal lesion. Other: Moderate RIGHT forehead hematoma noted. CT MAXILLOFACIAL FINDINGS Osseous: No fracture or mandibular dislocation. No destructive process. Orbits: Negative. No traumatic or inflammatory finding. RIGHT lens calcifications noted. Sinuses: Clear. Soft tissues: Moderate RIGHT forehead hematoma noted. CT CERVICAL SPINE FINDINGS Alignment: Normal. Skull base and vertebrae: No acute fracture. No primary bone lesion or focal pathologic process. Soft tissues and spinal canal: No prevertebral fluid or swelling. No visible canal hematoma. Disc levels: Mild multilevel degenerative disc disease and facet arthropathy from C3-C7 noted. Upper chest: No acute abnormality Other: None IMPRESSION: 1. Moderate RIGHT forehead hematoma without underlying fracture. 2. No evidence of acute intracranial abnormality. Atrophy and chronic small-vessel white matter ischemic changes. 3. No static evidence of acute injury to the cervical spine. 4. No acute facial bony injury. Electronically Signed   By: Margarette Canada M.D.   On: 12/19/2017 10:12   Dg Knee Complete 4 Views Right  Result Date: 12/19/2017 CLINICAL DATA:  [place] with right knee pain, bruising and swelling. EXAM: RIGHT KNEE - COMPLETE 4+ VIEW COMPARISON:  None. FINDINGS: Prepatellar soft tissue swelling. No joint effusion. No fracture. Osteoarthritis most pronounced in the medial compartment and patellofemoral joint. Chronic degenerative chondrocalcinosis. IMPRESSION: Prepatellar  soft tissue swelling. No other acute finding. Chronic degenerative changes as above. Electronically Signed   By: Nelson Chimes M.D.   On: 12/19/2017 10:35   Dg Hand Complete Right  Result Date: 12/19/2017 CLINICAL DATA:  [place] with forehead hematoma and hand pain. Laceration of the middle finger. EXAM: RIGHT HAND - COMPLETE 3+ VIEW COMPARISON:  None. FINDINGS: No evidence of fracture or dislocation. Chronic osteoarthritis at the first carpometacarpal joint and mild osteoarthritis of the inter phalangeal joints. IMPRESSION: No acute bone or joint finding.  Degenerative changes as above. Electronically Signed   By: Nelson Chimes M.D.   On: 12/19/2017 10:33   Ct Maxillofacial Wo Contrast  Result Date: 12/19/2017 CLINICAL DATA:  82 year old female with head, face and neck pain following fall. Initial encounter. EXAM: CT HEAD WITHOUT CONTRAST CT MAXILLOFACIAL WITHOUT CONTRAST CT CERVICAL  SPINE WITHOUT CONTRAST TECHNIQUE: Multidetector CT imaging of the head, cervical spine, and maxillofacial structures were performed using the standard protocol without intravenous contrast. Multiplanar CT image reconstructions of the cervical spine and maxillofacial structures were also generated. COMPARISON:  01/26/2014 head CT and prior studies FINDINGS: CT HEAD FINDINGS Brain: No evidence of acute infarction, hemorrhage, hydrocephalus, extra-axial collection or mass lesion/mass effect. A 6 mm basilar tip aneurysm does not appear significantly changed. Atrophy, chronic small-vessel white matter ischemic changes and remote infarcts/chronic is ischemia within bilateral basal ganglia/thalami again noted. Vascular: Atherosclerotic calcifications identified. Skull: Normal. Negative for fracture or focal lesion. Other: Moderate RIGHT forehead hematoma noted. CT MAXILLOFACIAL FINDINGS Osseous: No fracture or mandibular dislocation. No destructive process. Orbits: Negative. No traumatic or inflammatory finding. RIGHT lens calcifications  noted. Sinuses: Clear. Soft tissues: Moderate RIGHT forehead hematoma noted. CT CERVICAL SPINE FINDINGS Alignment: Normal. Skull base and vertebrae: No acute fracture. No primary bone lesion or focal pathologic process. Soft tissues and spinal canal: No prevertebral fluid or swelling. No visible canal hematoma. Disc levels: Mild multilevel degenerative disc disease and facet arthropathy from C3-C7 noted. Upper chest: No acute abnormality Other: None IMPRESSION: 1. Moderate RIGHT forehead hematoma without underlying fracture. 2. No evidence of acute intracranial abnormality. Atrophy and chronic small-vessel white matter ischemic changes. 3. No static evidence of acute injury to the cervical spine. 4. No acute facial bony injury. Electronically Signed   By: Margarette Canada M.D.   On: 12/19/2017 10:12    ____________________________________________    PROCEDURES  Procedure(s) performed:    Procedures  LACERATION REPAIR Performed by: Laban Emperor  Consent: Verbal consent obtained.  Consent given by: patient  Prepped and Draped in normal sterile fashion  Wound explored: No foreign bodies   Laceration Location: right middle finger  Laceration Length: 2 cm  Anesthesia: None  Local anesthetic: lidocaine 1% without epinephrine  Anesthetic total: 3 ml  Irrigation method: syringe  Amount of cleaning: 578ml normal saline  Skin closure: 5-0 nylon  Number of sutures: 5  Technique: Simple interrupted  Patient tolerance: Patient tolerated the procedure well with no immediate complications.  Medications  lidocaine (PF) (XYLOCAINE) 1 % injection 5 mL (5 mLs Intradermal Given by Other 12/19/17 1210)  Tdap (BOOSTRIX) injection 0.5 mL (0.5 mLs Intramuscular Given 12/19/17 1116)  traMADol (ULTRAM) tablet 50 mg (50 mg Oral Given 12/19/17 1117)     ____________________________________________   INITIAL IMPRESSION / ASSESSMENT AND PLAN / ED COURSE  Pertinent labs & imaging results that  were available during my care of the patient were reviewed by me and considered in my medical decision making (see chart for details).  Review of the [place] CSRS was performed in accordance of the [place] prior to dispensing any controlled drugs.   Patient presented to the emergency department for evaluation after witnessed fall after tripping this morning.  Vital signs and exam are reassuring.  CT head, maxillofacial, cervical are negative for acute processes.  Hand and knee x-ray negative for acute bony abnormalities.  Thoracic x-ray consistent with L2 fracture that is old and patient is being evaluated for by Ortho.  She is not having any back pain.  Laceration was repaired with stitches.  Splint was placed.  Tetanus shot was updated.  Patient is here with her daughter who will stay with her today.  Patient lives with a friend.  Patient is cheerful and talkative.  Patient will be discharged home with prescriptions for tramadol, Keflex, walker. Patient is to follow up  with PCP as directed. Patient is given ED precautions to return to the ED for any worsening or new symptoms.     ____________________________________________  FINAL CLINICAL IMPRESSION(S) / ED DIAGNOSES  Final diagnoses:  Fall, initial encounter  Laceration of right middle finger without foreign body without damage to nail, initial encounter  Acute pain of right knee  Closed compression fracture of second lumbar vertebra, sequela  Facial hematoma, initial encounter      NEW MEDICATIONS STARTED DURING THIS VISIT:  ED Discharge Orders        Ordered    traMADol (ULTRAM) 50 MG tablet  Every 6 hours PRN     12/19/17 1223    cephALEXin (KEFLEX) 500 MG capsule  4 times daily     12/19/17 1254    Walker standard     12/19/17 1309          This chart was dictated using voice recognition software/Dragon. Despite best efforts to proofread, errors can occur which can change the meaning. Any change was purely unintentional.     Laban Emperor, PA-C 12/19/17 1536    Earleen Newport, MD 12/19/17 [phone]

## 2017-12-20 NOTE — Progress Notes (Signed)
Patient: Toni White Female    DOB: 11-Aug-1935   82 y.o.   MRN: [id number] Visit Date: 12/21/2017  Today's Provider: Lelon Huh, MD   Chief Complaint  Patient presents with  . ER Follow Up   Subjective:    HPI   Follow up ER visit  Patient was seen in ER for Fall on 12/19/2017 . She was treated for; patient tripped and fell at doctors office [facility]) while doing walking oximetry. She was taken to the ER where she was found to have sustained a laceration of right middle finger and facial hematoma. Patient received 5 sutures to the right middle finger and a splint was placed. Patient given tetanus injection. Rx given for Tramadol and Keflex. Treatment for this included started Tramadol and Keflex and she is applying ice. She reports excellent compliance with treatment. She reports this condition is Unchanged. She reports knee pain, hematomas on both eyes. She denies pain in finger. ------------------------------------------------------------------------------------  Allergies  Allergen Reactions  . Flexeril  [Cyclobenzaprine Hcl]     Confusion  . Reglan [Metoclopramide] Hives  . Relafen [Nabumetone] Hives  . Risperdal  [Risperidone]     Confusion Rash  . Sulfa Antibiotics Hives  . Tape Other (See Comments)    Per patient pulls her skin off.  . Triamterene-Hctz     Hypercalcemia  . Hctz [Hydrochlorothiazide] Palpitations     Current Outpatient Medications:  .  albuterol (VENTOLIN HFA) 108 (90 BASE) MCG/ACT inhaler, Inhale 2 puffs into the lungs. Every 4-6 hours as needed, Disp: , Rfl:  .  alendronate (FOSAMAX) 70 MG tablet, TAKE 1 TABLET  EVERY 7 DAYS. TAKE WITH A FULL GLASS OF WATER ON AN EMPTY STOMACH., Disp: 12 tablet, Rfl: 4 .  amLODipine (NORVASC) 2.5 MG tablet, TAKE 1 TABLET EVERY DAY, Disp: 90 tablet, Rfl: 4 .  aspirin 81 MG tablet, Take 81 mg by mouth daily., Disp: , Rfl:  .  Calcium-Magnesium-Vitamin D (CALCIUM 500 PO), Take 1 tablet by  mouth 2 (two) times daily., Disp: , Rfl:  .  cephALEXin (KEFLEX) 500 MG capsule, Take 1 capsule (500 mg total) by mouth 4 (four) times daily for 10 days., Disp: 40 capsule, Rfl: 0 .  citalopram (CELEXA) 20 MG tablet, TAKE 1 TABLET EVERY DAY, Disp: , Rfl:  .  Fluticasone-Salmeterol (ADVAIR) 250-50 MCG/DOSE AEPB, Inhale 1 puff into the lungs 2 (two) times daily., Disp: , Rfl:  .  furosemide (LASIX) 20 MG tablet, TAKE 1 TABLET TWICE DAILY AS NEEDED  FOR  SWELLING, Disp: 90 tablet, Rfl: 3 .  gabapentin (NEURONTIN) 100 MG capsule, Take 1 capsule by mouth 2 (two) times daily., Disp: , Rfl:  .  levothyroxine (SYNTHROID, LEVOTHROID) 25 MCG tablet, TAKE 1 AND 1/2 TABLETS ONE TIME DAILY Take on an empty stomach with a glass of water at least 30-60 minutes before breakfast., Disp: , Rfl:  .  meloxicam (MOBIC) 15 MG tablet, TAKE ONE TABLET BY MOUTH EVERY DAY, Disp: 30 tablet, Rfl: 5 .  metoprolol succinate (TOPROL-XL) 50 MG 24 hr tablet, TAKE 1/2 TABLET EVERY DAY, Disp: 45 tablet, Rfl: 5 .  Multiple Vitamins-Minerals (PRESERVISION AREDS 2+MULTI VIT) CAPS, Take 1 capsule by mouth 2 (two) times daily., Disp: , Rfl:  .  OXYGEN, Inhale into the lungs Nightly., Disp: , Rfl:  .  pravastatin (PRAVACHOL) 40 MG tablet, Take 1 tablet (40 mg total) by mouth at bedtime., Disp: 90 tablet, Rfl: 4 .  ranitidine (ZANTAC) 300  MG tablet, TAKE 1 TABLET AT BEDTIME, Disp: 90 tablet, Rfl: 3 .  roflumilast (DALIRESP) 500 MCG TABS tablet, Take 500 mcg by mouth daily., Disp: , Rfl:  .  tiotropium (SPIRIVA) 18 MCG inhalation capsule, Place 18 mcg into inhaler and inhale daily., Disp: , Rfl:  .  traMADol (ULTRAM) 50 MG tablet, Take 0.5 tablets (25 mg total) by mouth every 6 (six) hours as needed., Disp: 6 tablet, Rfl: 0  Review of Systems  Constitutional: Negative for appetite change, chills, fatigue and fever.  Respiratory: Negative for chest tightness and shortness of breath.   Cardiovascular: Negative for chest pain and  palpitations.  Gastrointestinal: Negative for abdominal pain, nausea and vomiting.  Neurological: Negative for dizziness and weakness.    Social History   Tobacco Use  . Smoking status: Former Smoker    Packs/day: 1.00    Years: 30.00    Pack years: 30.00    Types: Cigarettes  . Smokeless tobacco: Never Used  . Tobacco comment: quit around 1989  Substance Use Topics  . Alcohol use: No    Alcohol/week: 0.0 oz   Objective:   BP 140/76 (BP Location: Right Arm, Patient Position: Sitting, Cuff Size: Normal)   Pulse 70   Temp 98.3 F (36.8 C) (Oral)   Resp 16   Wt 162 lb 9.6 oz (73.8 kg)   SpO2 94%   BMI 30.72 kg/m  Vitals:   12/21/17 0813  BP: 140/76  Pulse: 70  Resp: 16  Temp: 98.3 F (36.8 C)  TempSrc: Oral  SpO2: 94%  Weight: 162 lb 9.6 oz (73.8 kg)     Physical Exam   General Appearance:    Alert, cooperative, no distress  Eyes:    PERRL, conjunctiva/corneas clear, EOM's intact       Lungs:     Clear to auscultation bilaterally, respirations unlabored  Heart:    Regular rate and rhythm  Extremities:   Removed dressing from right third digit. No bleeding or discharge. Laceration is clean with no erythema.   Neurologic:   Awake, alert, oriented x 3. No apparent focal neurological           defect.   HEENT:  Small goosegg contusion right frontal scalp about 3cm above right eye. Bilateral raccoon eyes. Nares and ear canals clear.        Assessment & Plan:     1. Contusion of left temporofrontal scalp, initial encounter No sign of intracranial injury. Healing appropriately.   2. Laceration of left middle finger without foreign body without damage to nail, initial encounter Wound is clean and dry without infection. Redressed with triple antibiotic ointment. To return in 1 week to remove sutures.        Lelon Huh, MD  [facility]

## 2017-12-21 ENCOUNTER — Encounter: Payer: Self-pay | Admitting: Family Medicine

## 2017-12-21 ENCOUNTER — Ambulatory Visit (INDEPENDENT_AMBULATORY_CARE_PROVIDER_SITE_OTHER): Payer: Medicare HMO | Admitting: Family Medicine

## 2017-12-21 VITALS — BP 140/76 | HR 70 | Temp 98.3°F | Resp 16 | Wt 162.6 lb

## 2017-12-21 DIAGNOSIS — S0003XA Contusion of scalp, initial encounter: Secondary | ICD-10-CM

## 2017-12-21 DIAGNOSIS — S61213A Laceration without foreign body of left middle finger without damage to nail, initial encounter: Secondary | ICD-10-CM | POA: Diagnosis not present

## 2017-12-25 ENCOUNTER — Other Ambulatory Visit: Payer: Self-pay | Admitting: Family Medicine

## 2017-12-25 DIAGNOSIS — E785 Hyperlipidemia, unspecified: Secondary | ICD-10-CM

## 2017-12-27 NOTE — Progress Notes (Signed)
Patient: Toni White Female    DOB: 20-Jan-1936   82 y.o.   MRN: [id number] Visit Date: 12/28/2017  Today's Provider: Lelon Huh, MD   Chief Complaint  Patient presents with  . Suture / Staple Removal   Subjective:    HPI  Patient is here for suture removal. Patient had 5 stiches placed 12/19/2017 after she sustained laceration of left middle finger.    Allergies  Allergen Reactions  . Flexeril  [Cyclobenzaprine Hcl]     Confusion  . Reglan [Metoclopramide] Hives  . Relafen [Nabumetone] Hives  . Risperdal  [Risperidone]     Confusion Rash  . Sulfa Antibiotics Hives  . Tape Other (See Comments)    Per patient pulls her skin off.  . Triamterene-Hctz     Hypercalcemia  . Hctz [Hydrochlorothiazide] Palpitations     Current Outpatient Medications:  .  albuterol (VENTOLIN HFA) 108 (90 BASE) MCG/ACT inhaler, Inhale 2 puffs into the lungs. Every 4-6 hours as needed, Disp: , Rfl:  .  alendronate (FOSAMAX) 70 MG tablet, TAKE 1 TABLET  EVERY 7 DAYS. TAKE WITH A FULL GLASS OF WATER ON AN EMPTY STOMACH., Disp: 12 tablet, Rfl: 4 .  amLODipine (NORVASC) 2.5 MG tablet, TAKE 1 TABLET EVERY DAY, Disp: 90 tablet, Rfl: 4 .  aspirin 81 MG tablet, Take 81 mg by mouth daily., Disp: , Rfl:  .  Calcium-Magnesium-Vitamin D (CALCIUM 500 PO), Take 1 tablet by mouth 2 (two) times daily., Disp: , Rfl:  .  cephALEXin (KEFLEX) 500 MG capsule, Take 1 capsule (500 mg total) by mouth 4 (four) times daily for 10 days., Disp: 40 capsule, Rfl: 0 .  citalopram (CELEXA) 20 MG tablet, TAKE 1 TABLET EVERY DAY, Disp: , Rfl:  .  Fluticasone-Salmeterol (ADVAIR) 250-50 MCG/DOSE AEPB, Inhale 1 puff into the lungs 2 (two) times daily., Disp: , Rfl:  .  furosemide (LASIX) 20 MG tablet, TAKE 1 TABLET TWICE DAILY AS NEEDED  FOR  SWELLING, Disp: 90 tablet, Rfl: 3 .  gabapentin (NEURONTIN) 100 MG capsule, Take 1 capsule by mouth 2 (two) times daily., Disp: , Rfl:  .  levothyroxine (SYNTHROID, LEVOTHROID) 25 MCG  tablet, TAKE 1 AND 1/2 TABLETS ONE TIME DAILY Take on an empty stomach with a glass of water at least 30-60 minutes before breakfast., Disp: , Rfl:  .  meloxicam (MOBIC) 15 MG tablet, TAKE ONE TABLET BY MOUTH EVERY DAY, Disp: 30 tablet, Rfl: 5 .  metoprolol succinate (TOPROL-XL) 50 MG 24 hr tablet, TAKE 1/2 TABLET EVERY DAY, Disp: 45 tablet, Rfl: 5 .  Multiple Vitamins-Minerals (PRESERVISION AREDS 2+MULTI VIT) CAPS, Take 1 capsule by mouth 2 (two) times daily., Disp: , Rfl:  .  OXYGEN, Inhale into the lungs Nightly., Disp: , Rfl:  .  pravastatin (PRAVACHOL) 40 MG tablet, TAKE 1 TABLET (40 MG TOTAL) BY MOUTH AT BEDTIME., Disp: 90 tablet, Rfl: 4 .  ranitidine (ZANTAC) 300 MG tablet, TAKE 1 TABLET AT BEDTIME, Disp: 90 tablet, Rfl: 3 .  roflumilast (DALIRESP) 500 MCG TABS tablet, Take 500 mcg by mouth daily., Disp: , Rfl:  .  tiotropium (SPIRIVA) 18 MCG inhalation capsule, Place 18 mcg into inhaler and inhale daily., Disp: , Rfl:  .  traMADol (ULTRAM) 50 MG tablet, Take 0.5 tablets (25 mg total) by mouth every 6 (six) hours as needed., Disp: 6 tablet, Rfl: 0  Review of Systems  Constitutional: Negative for appetite change, chills, fatigue and fever.  Respiratory: Negative for  chest tightness and shortness of breath.   Cardiovascular: Negative for chest pain and palpitations.  Gastrointestinal: Negative for abdominal pain, nausea and vomiting.  Neurological: Negative for dizziness and weakness.    Social History   Tobacco Use  . Smoking status: Former Smoker    Packs/day: 1.00    Years: 30.00    Pack years: 30.00    Types: Cigarettes  . Smokeless tobacco: Never Used  . Tobacco comment: quit around 1989  Substance Use Topics  . Alcohol use: No    Alcohol/week: 0.0 oz   Objective:   BP 110/70 (BP Location: Right Arm, Patient Position: Sitting, Cuff Size: Normal)   Pulse 67   Temp 98 F (36.7 C) (Oral)   Resp 16   SpO2 94%  Vitals:   12/28/17 0814  BP: 110/70  Pulse: 67  Resp: 16    Temp: 98 F (36.7 C)  TempSrc: Oral  SpO2: 94%     Physical Exam  Wound dorsum of right third finger well approximated, intact with no discharge. Surrounded by faint erythema. Five sutures in place .     Assessment & Plan:     1. Laceration of left middle finger without foreign body without damage to nail, initial encounter 5 sutures removed without difficulty. Counseled to avoid complete flexion of finger until healed. Finish antibiotic. Apply neosporin ointment daily until healed. Return if any separation of wound.        Lelon Huh, MD  [facility]

## 2017-12-28 ENCOUNTER — Ambulatory Visit (INDEPENDENT_AMBULATORY_CARE_PROVIDER_SITE_OTHER): Payer: Medicare HMO | Admitting: Family Medicine

## 2017-12-28 ENCOUNTER — Encounter: Payer: Self-pay | Admitting: Family Medicine

## 2017-12-28 VITALS — BP 110/70 | HR 67 | Temp 98.0°F | Resp 16

## 2017-12-28 DIAGNOSIS — S61213A Laceration without foreign body of left middle finger without damage to nail, initial encounter: Secondary | ICD-10-CM

## 2017-12-30 DIAGNOSIS — J449 Chronic obstructive pulmonary disease, unspecified: Secondary | ICD-10-CM | POA: Diagnosis not present

## 2018-01-21 ENCOUNTER — Other Ambulatory Visit: Payer: Self-pay | Admitting: Family Medicine

## 2018-01-22 DIAGNOSIS — R5382 Chronic fatigue, unspecified: Secondary | ICD-10-CM | POA: Diagnosis not present

## 2018-01-22 DIAGNOSIS — R251 Tremor, unspecified: Secondary | ICD-10-CM | POA: Diagnosis not present

## 2018-01-22 DIAGNOSIS — H539 Unspecified visual disturbance: Secondary | ICD-10-CM | POA: Diagnosis not present

## 2018-01-22 DIAGNOSIS — R410 Disorientation, unspecified: Secondary | ICD-10-CM | POA: Diagnosis not present

## 2018-01-28 ENCOUNTER — Ambulatory Visit (INDEPENDENT_AMBULATORY_CARE_PROVIDER_SITE_OTHER): Payer: Medicare HMO | Admitting: Family Medicine

## 2018-01-28 ENCOUNTER — Encounter: Payer: Self-pay | Admitting: Family Medicine

## 2018-01-28 VITALS — BP 110/76 | HR 70 | Temp 98.1°F | Wt 169.8 lb

## 2018-01-28 DIAGNOSIS — J449 Chronic obstructive pulmonary disease, unspecified: Secondary | ICD-10-CM | POA: Diagnosis not present

## 2018-01-28 DIAGNOSIS — K219 Gastro-esophageal reflux disease without esophagitis: Secondary | ICD-10-CM | POA: Diagnosis not present

## 2018-01-28 DIAGNOSIS — T17308A Unspecified foreign body in larynx causing other injury, initial encounter: Secondary | ICD-10-CM

## 2018-01-28 NOTE — Patient Instructions (Signed)
Gastroesophageal Reflux Disease, Adult Normally, food travels down the esophagus and stays in the stomach to be digested. If a person has gastroesophageal reflux disease (GERD), food and stomach acid move back up into the esophagus. When this happens, the esophagus becomes sore and swollen (inflamed). Over time, GERD can make small holes (ulcers) in the lining of the esophagus. Follow these instructions at home: Diet  Follow a diet as told by your doctor. You may need to avoid foods and drinks such as: ? Coffee and tea (with or without caffeine). ? Drinks that contain alcohol. ? Energy drinks and sports drinks. ? Carbonated drinks or sodas. ? Chocolate and cocoa. ? Peppermint and mint flavorings. ? Garlic and onions. ? Horseradish. ? Spicy and acidic foods, such as peppers, chili powder, curry powder, vinegar, hot sauces, and BBQ sauce. ? Citrus fruit juices and citrus fruits, such as oranges, lemons, and limes. ? Tomato-based foods, such as red sauce, chili, salsa, and pizza with red sauce. ? Fried and fatty foods, such as donuts, french fries, potato chips, and high-fat dressings. ? High-fat meats, such as hot dogs, rib eye steak, sausage, ham, and bacon. ? High-fat dairy items, such as whole milk, butter, and cream cheese.  Eat small meals often. Avoid eating large meals.  Avoid drinking large amounts of liquid with your meals.  Avoid eating meals during the 2-3 hours before bedtime.  Avoid lying down right after you eat.  Do not exercise right after you eat. General instructions  Pay attention to any changes in your symptoms.  Take over-the-counter and prescription medicines only as told by your doctor. Do not take aspirin, ibuprofen, or other NSAIDs unless your doctor says it is okay.  Do not use any tobacco products, including cigarettes, chewing tobacco, and e-cigarettes. If you need help quitting, ask your doctor.  Wear loose clothes. Do not wear anything tight around  your waist.  Raise (elevate) the head of your bed about 6 inches (15 cm).  Try to lower your stress. If you need help doing this, ask your doctor.  If you are overweight, lose an amount of weight that is healthy for you. Ask your doctor about a safe weight loss goal.  Keep all follow-up visits as told by your doctor. This is important. Contact a doctor if:  You have new symptoms.  You lose weight and you do not know why it is happening.  You have trouble swallowing, or it hurts to swallow.  You have wheezing or a cough that keeps happening.  Your symptoms do not get better with treatment.  You have a hoarse voice. Get help right away if:  You have pain in your arms, neck, jaw, teeth, or back.  You feel sweaty, dizzy, or light-headed.  You have chest pain or shortness of breath.  You throw up (vomit) and your throw up looks like blood or coffee grounds.  You pass out (faint).  Your poop (stool) is bloody or black.  You cannot swallow, drink, or eat. This information is not intended to replace advice given to you by your health care provider. Make sure you discuss any questions you have with your health care provider. Document Released: 01/10/2008 Document Revised: 12/30/2015 Document Reviewed: 11/18/2014 Elsevier Interactive Patient Education  2018 [place], Adult Choking occurs when a food or object gets stuck in the throat or windpipe (trachea) and blocks the airway. If the airway is partly blocked, coughing will usually cause the food or object to come  out. If the airway is completely blocked, immediate action is needed to make it come out. The Heimlich maneuver, also called abdominal thrusts, forces air from the abdomen to go up into the throat to relieve a blockage. Complete airway blockage is life-threatening because it causes breathing to stop. Choking is a medical emergency that requires fast, appropriate action by anyone who is available. What to do if  coughing and able to speak If a person has a partial airway blockage and he or she is coughing and is able to speak:  Do not interfere. Allow coughing to clear the airway.  Do not let him or her try to drink until the food or object comes out.  Stay with the person until the food or object comes out. Watch for signs of choking (complete airway blockage).  Signs of choking There is a complete airway blockage if the person who is choking:  Is unable to breathe.  Is making soft or high-pitched sounds while breathing.  Is unable to cough or is coughing weakly, ineffectively, or silently.  Is unable to cry, speak, or make sounds.  Is turning blue.  Is holding the neck with both hands. This is the universal sign of choking.  Nods "yes" when asked if he or she is choking.  Heimlich maneuver for choking For an adult or for a child over 1 year of age who is sitting or standing 1. Ask the person whether he or she is choking. If the person nods, continue to step 2. 2. Shout for help. If someone responds, tell him or her to call local emergency services (911 in the [place] Have him or her use a landline, if possible. 3. Stand or kneel behind the person and lean him or her forward slightly. 4. Make a fist with one hand, put your arms around the person's midsection, and grasp your fist with your other hand. Place the thumb side of your fist against the person's abdomen, just below the ribs. 5. Quickly press inward (toward you) and upward with both hands. Repeat this 3-5 times. ? If the person is pregnant or obese, wrap your arms around the chest (under the armpits), place your hands over the breastbone, and do chest thrusts. ? If you are choking and you are alone, you can do abdominal thrusts on yourself by bending over a hard surface, such as a table or a chair. 6. Repeat this maneuver until the object comes out and the person is able to breathe, or until the person becomes  unconscious. 7. Anyone who has been given the Heimlich maneuver should be seen by a health care provider after the event. For an adult or for a child over 1 year of age who is unconscious If the choking person collapses or is found on the ground and you know he or she was choking: 1. Shout for help. ? If someone responds, tell him or her call local emergency services (911 in [place] Have him or her use a landline, if possible. ? If no one responds, call local emergency services (911 in the [place] yourself, if possible. 2. Begin CPR, starting with compressions. Every time you open the airway to give rescue breaths, open the person's mouth. If you can see the food or object and it can be easily pulled out, remove it with your fingers. 3. After 5 cycles or 2 minutes of CPR, call local emergency services (911 in [place] if a call has not already been made. 4. Continue CPR  until the person starts breathing or until help arrives. 5. The force of the chest compressions will help to get the object out of the airway.  If you are choking: 1. Call local emergency services (911 in [place] Use a landline if possible. Do not worry about communicating what is happening. Do not hang up the phone. Someone may be sent to help you anyway. 2. Make a fist with one hand. Put the thumb side of the fist against your abdomen, just above the belly button and well below the breastbone. If you are pregnant or obese, put your fist on your chest instead, just below the breastbone and just above your lowest ribs. 3. Hold your fist with your other hand and bend over a hard surface, such as a table or chair. 4. Forcefully push your fist in and up. 5. Continue to do this until the food or object comes out. Prevention  Chew food thoroughly.  Avoid talking while you are chewing and swallowing. To be prepared if choking occurs, take a certified first-aid training course to learn how to correctly perform the Heimlich  maneuver. Contact a health care provider if:  You have trouble swallowing food. Get help right away if:  You have a fever after choking stops.  You have problems breathing after choking stops.  You were given the Heimlich maneuver. This information is not intended to replace advice given to you by your health care provider. Make sure you discuss any questions you have with your health care provider. Document Released: 08/31/2004 Document Revised: 03/24/2016 Document Reviewed: 03/25/2016 Elsevier Interactive Patient Education  [employer].

## 2018-01-28 NOTE — Progress Notes (Signed)
Patient: Toni White Female    DOB: [birth year]   82 y.o.   MRN: [id number] Visit Date: 01/28/2018  Today's Provider: Vernie Murders, PA   Chief Complaint  Patient presents with  . Choking while eating   Subjective:    HPI Patient and patient's daughter presents today with C/O choking when eating and sleeping. Daughter states when patient chokes it sounds like a gargling sound. Patient has been choking while eating for approximately 1 year ago but gradually worsening. Patient states she was being treated for acid reflux. She takes Zantac 300 mg at bedtime.    Past Medical History:  Diagnosis Date  . COPD (chronic obstructive pulmonary disease) ([place])   . History of chicken pox   . Hyperlipidemia   . Hypertension   . Ulcer    Patient Active Problem List   Diagnosis Date Noted  . Varicose veins of both legs with edema 05/02/2017  . Recurrent major depressive disorder, in partial remission ([place]) 06/08/2015  . Hypersomnolence 06/08/2015  . Macular degeneration 06/08/2015  . Sciatica 06/08/2015  . LBP (low back pain) 06/08/2015  . OP (osteoporosis) 06/08/2015  . Bibasilar crackles 06/08/2015  . Edema extremities 06/08/2015  . Tremor 03/17/2015  . 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 Diffuse cystic mastopathy 06/26/2013  . Myalgia and myositis 07/06/2009  . Hypoxemia 07/06/2009  . Hyperlipidemia 05/19/2008  . Prediabetes 12/18/2007  . One eye: total vision impairment; other eye: normal vision 06/29/2007  . Essential (primary) hypertension 08/07/1998  . Chronic obstructive pulmonary disease ([place]) 08/07/1998   Past Surgical History:  Procedure Laterality Date  . ABDOMINAL HYSTERECTOMY  2003   with BSO due to bleeding  . APPENDECTOMY    . BREAST BIOPSY Left 2005   stereo breast biopsy   . BREAST BIOPSY Right 2005   core bx. benign  . BREAST BIOPSY Left 2000?   benign  . BREAST CYST EXCISION Left 1990   incision and draniage of abscess  .  CHOLECYSTECTOMY    . COLONOSCOPY  2003   Dr. Vira Agar. Hyperplastic polyps; Single polyp excision from rectum  . CT Scan of Brain  08/20/2013   Mild diffuse cortical atrophy. Mid chronic ischemic white matter disease. Wide neck basilar tip aneurysm 6x6x35mm on follow up CTA  . [place]  . INCISION AND DRAINAGE BREAST ABSCESS Left 1990  . TONSILLECTOMY  1952  . UPPER GI ENDOSCOPY  02/26/2002   Dr. Tiffany Kocher; Gastritis. Single gastric ectasia   Family History  Problem Relation Age of Onset  . Heart disease Father   . Congestive Heart Failure Father   . Heart disease Mother   . Congestive Heart Failure Mother   . Cancer Brother        lung  . Lung cancer Brother   . Diabetes Daughter   . Diabetes Daughter    Allergies  Allergen Reactions  . Flexeril  [Cyclobenzaprine Hcl]     Confusion  . Reglan [Metoclopramide] Hives  . Relafen [Nabumetone] Hives  . Risperdal  [Risperidone]     Confusion Rash  . Sulfa Antibiotics Hives  . Tape Other (See Comments)    Per patient pulls her skin off.  . Triamterene-Hctz     Hypercalcemia  . Hctz [Hydrochlorothiazide] Palpitations    Current Outpatient Medications:  .  albuterol (VENTOLIN HFA) 108 (90 BASE) MCG/ACT inhaler, Inhale 2 puffs into the lungs. Every 4-6 hours as needed, Disp: , Rfl:  .  alendronate (FOSAMAX) 70 MG tablet, TAKE 1 TABLET  EVERY 7 DAYS. TAKE WITH A FULL GLASS OF WATER ON AN EMPTY STOMACH., Disp: 12 tablet, Rfl: 4 .  amLODipine (NORVASC) 2.5 MG tablet, TAKE 1 TABLET EVERY DAY, Disp: 90 tablet, Rfl: 4 .  aspirin 81 MG tablet, Take 81 mg by mouth daily., Disp: , Rfl:  .  Calcium-Magnesium-Vitamin D (CALCIUM 500 PO), Take 1 tablet by mouth 2 (two) times daily., Disp: , Rfl:  .  citalopram (CELEXA) 20 MG tablet, TAKE 1 TABLET EVERY DAY, Disp: , Rfl:  .  Fluticasone-Salmeterol (ADVAIR) 250-50 MCG/DOSE AEPB, Inhale 1 puff into the lungs 2 (two) times daily., Disp: , Rfl:  .  furosemide (LASIX) 20 MG tablet,  TAKE 1 TABLET TWICE DAILY AS NEEDED  FOR  SWELLING, Disp: 90 tablet, Rfl: 3 .  gabapentin (NEURONTIN) 100 MG capsule, Take 1 capsule by mouth 2 (two) times daily., Disp: , Rfl:  .  levothyroxine (SYNTHROID, LEVOTHROID) 25 MCG tablet, TAKE 1 AND 1/2 TABLETS ONE TIME DAILY Take on an empty stomach with a glass of water at least 30-60 minutes before breakfast., Disp: , Rfl:  .  meloxicam (MOBIC) 15 MG tablet, TAKE ONE TABLET BY MOUTH EVERY DAY, Disp: 30 tablet, Rfl: 5 .  metoprolol succinate (TOPROL-XL) 50 MG 24 hr tablet, TAKE 1/2 TABLET EVERY DAY, Disp: 45 tablet, Rfl: 5 .  Multiple Vitamins-Minerals (PRESERVISION AREDS 2+MULTI VIT) CAPS, Take 1 capsule by mouth 2 (two) times daily., Disp: , Rfl:  .  OXYGEN, Inhale into the lungs Nightly., Disp: , Rfl:  .  pravastatin (PRAVACHOL) 40 MG tablet, TAKE 1 TABLET (40 MG TOTAL) BY MOUTH AT BEDTIME., Disp: 90 tablet, Rfl: 4 .  ranitidine (ZANTAC) 300 MG tablet, TAKE 1 TABLET AT BEDTIME, Disp: 90 tablet, Rfl: 3 .  roflumilast (DALIRESP) 500 MCG TABS tablet, Take 500 mcg by mouth daily., Disp: , Rfl:  .  tiotropium (SPIRIVA) 18 MCG inhalation capsule, Place 18 mcg into inhaler and inhale daily., Disp: , Rfl:  .  traMADol (ULTRAM) 50 MG tablet, Take 0.5 tablets (25 mg total) by mouth every 6 (six) hours as needed., Disp: 6 tablet, Rfl: 0  Review of Systems  Constitutional: Negative.   Respiratory: Negative.   Cardiovascular: Negative.   Gastrointestinal:       Choking    Social History   Tobacco Use  . Smoking status: Former Smoker    Packs/day: 1.00    Years: 30.00    Pack years: 30.00    Types: Cigarettes  . Smokeless tobacco: Never Used  . Tobacco comment: quit around 1989  Substance Use Topics  . Alcohol use: No   Objective:   BP 110/76 (BP Location: Right Arm, Patient Position: Sitting, Cuff Size: Normal)   Pulse 70   Temp 98.1 F (36.7 C) (Oral)   Wt 169 lb 12.8 oz (77 kg)   SpO2 94%   BMI 32.08 kg/m  Wt Readings from Last 3  Encounters:  01/28/18 169 lb 12.8 oz (77 kg)  12/21/17 162 lb 9.6 oz (73.8 kg)  12/19/17 165 lb (74.8 kg)   Physical Exam  Constitutional: She is oriented to person, place, and time. She appears well-developed and well-nourished. No distress.  HENT:  Head: Normocephalic and atraumatic.  Right Ear: Hearing and external ear normal.  Left Ear: Hearing and external ear normal.  Nose: Nose normal.  Mouth/Throat: Oropharynx is clear and moist.  Eyes: Conjunctivae and lids are normal. Right eye exhibits no  discharge. Left eye exhibits no discharge. No scleral icterus.  Neck: Neck supple. No tracheal deviation present.  Cardiovascular: Normal rate.  Pulmonary/Chest: Effort normal and breath sounds normal. No respiratory distress. She has no wheezes. She has no rales.  Slightly distant breath sounds.  Abdominal: Soft. Bowel sounds are normal.  Musculoskeletal: She exhibits edema.  2+ lower legs bilaterally. Good pulses and sensation.  Lymphadenopathy:    She has no cervical adenopathy.  Neurological: She is alert and oriented to person, place, and time.  Slow gait and balance is poor. Walks with a walker.  Skin: Skin is intact. No lesion and no rash noted.  Psychiatric: She has a normal mood and affect. Her speech is normal and behavior is normal. Thought content normal.      Assessment & Plan:     1. Choking, initial encounter Family has heard her coughing/choking of several occasions. Denies heart burn but notices some difficulty with steak, chicken or breads. May use Thick-It to help with thin liquids being aspirated. May need swallowing study if this does not help limit choking sensation. Recheck if no better in 4-5 days.  2. Chronic obstructive pulmonary disease, unspecified COPD type ([place]) History of cough and congestion. Uses Ventolin-HFA prn wheeze with Advair 250-50 mcg/dose 1 puff BID. May add Mucinex BID for congestion. No fever, rales or rhonchi today.  3. Gastroesophageal  reflux disease, esophagitis presence not specified No hematemesis or diarrhea. No abdominal pains. Wakes up coughing/choking at night occasionally. May use the Omeprazole 20 mg (OTC) daily for a week then go back to the Ranitidine. Advised to elevated head of bed with bed risers and recheck in 3-4 days if needed.       Vernie Murders, PA  [facility]

## 2018-01-30 ENCOUNTER — Ambulatory Visit: Payer: Self-pay | Admitting: Physician Assistant

## 2018-01-30 DIAGNOSIS — J449 Chronic obstructive pulmonary disease, unspecified: Secondary | ICD-10-CM | POA: Diagnosis not present

## 2018-02-13 DIAGNOSIS — L821 Other seborrheic keratosis: Secondary | ICD-10-CM | POA: Diagnosis not present

## 2018-02-13 DIAGNOSIS — Z08 Encounter for follow-up examination after completed treatment for malignant neoplasm: Secondary | ICD-10-CM | POA: Diagnosis not present

## 2018-02-13 DIAGNOSIS — D2261 Melanocytic nevi of right upper limb, including shoulder: Secondary | ICD-10-CM | POA: Diagnosis not present

## 2018-02-13 DIAGNOSIS — D2271 Melanocytic nevi of right lower limb, including hip: Secondary | ICD-10-CM | POA: Diagnosis not present

## 2018-02-13 DIAGNOSIS — D2262 Melanocytic nevi of left upper limb, including shoulder: Secondary | ICD-10-CM | POA: Diagnosis not present

## 2018-02-13 DIAGNOSIS — D225 Melanocytic nevi of trunk: Secondary | ICD-10-CM | POA: Diagnosis not present

## 2018-02-13 DIAGNOSIS — D2272 Melanocytic nevi of left lower limb, including hip: Secondary | ICD-10-CM | POA: Diagnosis not present

## 2018-02-13 DIAGNOSIS — Z85828 Personal history of other malignant neoplasm of skin: Secondary | ICD-10-CM | POA: Diagnosis not present

## 2018-03-01 DIAGNOSIS — J449 Chronic obstructive pulmonary disease, unspecified: Secondary | ICD-10-CM | POA: Diagnosis not present

## 2018-04-01 DIAGNOSIS — J449 Chronic obstructive pulmonary disease, unspecified: Secondary | ICD-10-CM | POA: Diagnosis not present

## 2018-04-15 ENCOUNTER — Other Ambulatory Visit: Payer: Self-pay | Admitting: Family Medicine

## 2018-04-15 MED ORDER — LEVOTHYROXINE SODIUM 25 MCG PO TABS: 37.5000 ug | ORAL_TABLET | Freq: Every day | ORAL | 4 refills | Status: DC

## 2018-04-15 NOTE — Telephone Encounter (Signed)
Needs a refill on  Levethyroxine 25 MCG  90 days  [facility]  CB#  [phone]  Thanks Con Memos

## 2018-04-16 DIAGNOSIS — J449 Chronic obstructive pulmonary disease, unspecified: Secondary | ICD-10-CM | POA: Diagnosis not present

## 2018-04-16 DIAGNOSIS — R0902 Hypoxemia: Secondary | ICD-10-CM | POA: Diagnosis not present

## 2018-04-16 DIAGNOSIS — R0609 Other forms of dyspnea: Secondary | ICD-10-CM | POA: Diagnosis not present

## 2018-04-29 DIAGNOSIS — H40003 Preglaucoma, unspecified, bilateral: Secondary | ICD-10-CM | POA: Diagnosis not present

## 2018-05-01 ENCOUNTER — Ambulatory Visit (INDEPENDENT_AMBULATORY_CARE_PROVIDER_SITE_OTHER): Payer: Medicare HMO

## 2018-05-01 DIAGNOSIS — Z23 Encounter for immunization: Secondary | ICD-10-CM

## 2018-05-02 DIAGNOSIS — J449 Chronic obstructive pulmonary disease, unspecified: Secondary | ICD-10-CM | POA: Diagnosis not present

## 2018-05-10 DIAGNOSIS — J449 Chronic obstructive pulmonary disease, unspecified: Secondary | ICD-10-CM | POA: Diagnosis not present

## 2018-05-27 DIAGNOSIS — E7801 Familial hypercholesterolemia: Secondary | ICD-10-CM | POA: Diagnosis not present

## 2018-05-27 DIAGNOSIS — I10 Essential (primary) hypertension: Secondary | ICD-10-CM | POA: Diagnosis not present

## 2018-05-27 DIAGNOSIS — I725 Aneurysm of other precerebral arteries: Secondary | ICD-10-CM | POA: Diagnosis not present

## 2018-05-27 DIAGNOSIS — I83893 Varicose veins of bilateral lower extremities with other complications: Secondary | ICD-10-CM | POA: Diagnosis not present

## 2018-05-27 DIAGNOSIS — J449 Chronic obstructive pulmonary disease, unspecified: Secondary | ICD-10-CM | POA: Diagnosis not present

## 2018-05-27 DIAGNOSIS — R0602 Shortness of breath: Secondary | ICD-10-CM | POA: Diagnosis not present

## 2018-05-30 ENCOUNTER — Other Ambulatory Visit: Payer: Self-pay | Admitting: Family Medicine

## 2018-05-30 MED ORDER — LEVOTHYROXINE SODIUM 25 MCG PO TABS: 37.5000 ug | ORAL_TABLET | Freq: Every day | ORAL | 4 refills | Status: DC

## 2018-05-30 NOTE — Telephone Encounter (Signed)
[facility] faxed refill request for the following medications:  levothyroxine (SYNTHROID, LEVOTHROID) 25 MCG tablet   Please advise.

## 2018-06-10 DIAGNOSIS — J449 Chronic obstructive pulmonary disease, unspecified: Secondary | ICD-10-CM | POA: Diagnosis not present

## 2018-06-13 ENCOUNTER — Other Ambulatory Visit: Payer: Self-pay | Admitting: Family Medicine

## 2018-06-13 DIAGNOSIS — Z1231 Encounter for screening mammogram for malignant neoplasm of breast: Secondary | ICD-10-CM

## 2018-07-10 DIAGNOSIS — J449 Chronic obstructive pulmonary disease, unspecified: Secondary | ICD-10-CM | POA: Diagnosis not present

## 2018-07-16 ENCOUNTER — Ambulatory Visit
Admission: RE | Admit: 2018-07-16 | Discharge: 2018-07-16 | Disposition: A | Discharge status: Home / Self Care | Payer: Medicare HMO | Source: Ambulatory Visit | Attending: Family Medicine | Admitting: Family Medicine

## 2018-07-16 DIAGNOSIS — Z1231 Encounter for screening mammogram for malignant neoplasm of breast: Secondary | ICD-10-CM | POA: Diagnosis not present

## 2018-07-24 ENCOUNTER — Telehealth: Payer: Self-pay | Admitting: *Deleted

## 2018-07-24 NOTE — Telephone Encounter (Signed)
Normal. Should have gotten a letter please make sure mailing address is accurate

## 2018-07-24 NOTE — Telephone Encounter (Signed)
Please advise   Thanks,    -Laura  

## 2018-07-24 NOTE — Telephone Encounter (Signed)
Patient advised of results. I verified patients address. She has 2 addresses listed in her chart (she gets her mail at [place] right now) .It is possible the letter was mailed to the other address ([address]

## 2018-07-24 NOTE — Telephone Encounter (Signed)
Patient called requesting mammogram results. Please advise?

## 2018-08-10 DIAGNOSIS — J449 Chronic obstructive pulmonary disease, unspecified: Secondary | ICD-10-CM | POA: Diagnosis not present

## 2018-08-19 DIAGNOSIS — Z8709 Personal history of other diseases of the respiratory system: Secondary | ICD-10-CM | POA: Diagnosis not present

## 2018-08-19 DIAGNOSIS — Z9981 Dependence on supplemental oxygen: Secondary | ICD-10-CM | POA: Diagnosis not present

## 2018-08-19 DIAGNOSIS — J449 Chronic obstructive pulmonary disease, unspecified: Secondary | ICD-10-CM | POA: Diagnosis not present

## 2018-08-19 DIAGNOSIS — R0609 Other forms of dyspnea: Secondary | ICD-10-CM | POA: Diagnosis not present

## 2018-08-24 ENCOUNTER — Other Ambulatory Visit: Payer: Self-pay | Admitting: Family Medicine

## 2018-09-10 DIAGNOSIS — J449 Chronic obstructive pulmonary disease, unspecified: Secondary | ICD-10-CM | POA: Diagnosis not present

## 2018-10-09 DIAGNOSIS — J449 Chronic obstructive pulmonary disease, unspecified: Secondary | ICD-10-CM | POA: Diagnosis not present

## 2018-10-16 ENCOUNTER — Telehealth: Payer: Self-pay | Admitting: Family Medicine

## 2018-10-16 MED ORDER — PANTOPRAZOLE SODIUM 40 MG PO TBEC: 40.0000 mg | DELAYED_RELEASE_TABLET | Freq: Every day | ORAL | 3 refills | Status: DC

## 2018-10-16 NOTE — Telephone Encounter (Signed)
Toni White cannot get  Zantac and any medicines containing ranitidine.   She needs either Omeprazole or Pantoprazole sent into [facility].   2- When does she need another appt with Dr. Caryn Section?

## 2018-10-26 ENCOUNTER — Other Ambulatory Visit: Payer: Self-pay | Admitting: Family Medicine

## 2018-11-09 DIAGNOSIS — J449 Chronic obstructive pulmonary disease, unspecified: Secondary | ICD-10-CM | POA: Diagnosis not present

## 2018-12-09 DIAGNOSIS — J449 Chronic obstructive pulmonary disease, unspecified: Secondary | ICD-10-CM | POA: Diagnosis not present

## 2018-12-10 ENCOUNTER — Other Ambulatory Visit: Payer: Self-pay | Admitting: Family Medicine

## 2018-12-10 DIAGNOSIS — M810 Age-related osteoporosis without current pathological fracture: Secondary | ICD-10-CM

## 2018-12-10 MED ORDER — ALENDRONATE SODIUM 70 MG PO TABS
70.0000 mg | ORAL_TABLET | ORAL | 4 refills | Status: DC
Start: 2018-12-10 — End: 2019-12-24

## 2018-12-10 NOTE — Telephone Encounter (Signed)
[facility] faxed refill request for the following medications:   alendronate (FOSAMAX) 70 MG tablet      Please advise.  

## 2018-12-13 ENCOUNTER — Ambulatory Visit: Payer: Self-pay

## 2018-12-18 ENCOUNTER — Other Ambulatory Visit: Payer: Self-pay | Admitting: Family Medicine

## 2018-12-18 DIAGNOSIS — M10472 Other secondary gout, left ankle and foot: Secondary | ICD-10-CM | POA: Diagnosis not present

## 2018-12-18 DIAGNOSIS — J449 Chronic obstructive pulmonary disease, unspecified: Secondary | ICD-10-CM | POA: Diagnosis not present

## 2018-12-18 DIAGNOSIS — M25572 Pain in left ankle and joints of left foot: Secondary | ICD-10-CM | POA: Diagnosis not present

## 2018-12-18 DIAGNOSIS — M533 Sacrococcygeal disorders, not elsewhere classified: Secondary | ICD-10-CM

## 2018-12-18 DIAGNOSIS — R0600 Dyspnea, unspecified: Secondary | ICD-10-CM | POA: Diagnosis not present

## 2018-12-18 DIAGNOSIS — M7061 Trochanteric bursitis, right hip: Secondary | ICD-10-CM

## 2018-12-26 ENCOUNTER — Ambulatory Visit (INDEPENDENT_AMBULATORY_CARE_PROVIDER_SITE_OTHER): Payer: Medicare HMO

## 2018-12-26 ENCOUNTER — Other Ambulatory Visit: Payer: Self-pay

## 2018-12-26 DIAGNOSIS — Z0000 Encounter for general adult medical examination without abnormal findings: Secondary | ICD-10-CM

## 2018-12-26 DIAGNOSIS — M810 Age-related osteoporosis without current pathological fracture: Secondary | ICD-10-CM

## 2018-12-26 NOTE — Progress Notes (Signed)
Subjective:   Toni White is a 83 y.o. female who presents for Medicare Annual (Subsequent) preventive examination.    This visit is being conducted through telemedicine due to the COVID-19 pandemic. This patient has given me verbal consent via doximity to conduct this visit, patient states they are participating from their home address. Some vital signs may be absent or patient reported.    Patient identification: identified by name, DOB, and current address  Review of Systems:  N/A  Cardiac Risk Factors include: advanced age (>11men, >11 women);dyslipidemia;hypertension;obesity (BMI >30kg/m2)     Objective:     Vitals: There were no vitals taken for this visit.  There is no height or weight on file to calculate BMI. Unable to obtain vitals due to visit being conducted via telephonically.   Advanced Directives 12/26/2018 12/11/2017 12/06/2016  Does Patient Have a Medical Advance Directive? Yes Yes Yes  Type of Paramedic of [place];Living will Living will [place];Living will  Copy of [place] in Chart? No - copy requested - No - copy requested    Tobacco Social History   Tobacco Use  Smoking Status Former Smoker  . Packs/day: 1.00  . Years: 30.00  . Pack years: 30.00  . Types: Cigarettes  Smokeless Tobacco Never Used  Tobacco Comment   quit around 1989     Counseling given: Not Answered Comment: quit around 1989   Clinical Intake:  Pre-visit preparation completed: Yes  Pain : No/denies pain Pain Score: 0-No pain     Nutritional Status: BMI > 30  Obese Nutritional Risks: None Diabetes: No  How often do you need to have someone help you when you read instructions, pamphlets, or other written materials from your doctor or pharmacy?: 1 - Never  Interpreter Needed?: No  Information entered by :: [facility], LPN  Past Medical History:  Diagnosis Date  . COPD (chronic obstructive pulmonary  disease) ([place])   . History of chicken pox   . Hyperlipidemia   . Hypertension   . Ulcer    Past Surgical History:  Procedure Laterality Date  . ABDOMINAL HYSTERECTOMY  2003   with BSO due to bleeding  . APPENDECTOMY    . BREAST BIOPSY Left 2005   stereo breast biopsy   . BREAST BIOPSY Right 2005   core bx. benign  . BREAST BIOPSY Left 2000?   benign  . BREAST CYST EXCISION Left 1990   incision and draniage of abscess  . CHOLECYSTECTOMY    . COLONOSCOPY  2003   Dr. Vira Agar. Hyperplastic polyps; Single polyp excision from rectum  . CT Scan of Brain  08/20/2013   Mild diffuse cortical atrophy. Mid chronic ischemic white matter disease. Wide neck basilar tip aneurysm 6x6x17mm on follow up CTA  . [place]  . INCISION AND DRAINAGE BREAST ABSCESS Left 1990  . TONSILLECTOMY  1952  . UPPER GI ENDOSCOPY  02/26/2002   Dr. Tiffany Kocher; Gastritis. Single gastric ectasia   Family History  Problem Relation Age of Onset  . Heart disease Father   . Congestive Heart Failure Father   . Heart disease Mother   . Congestive Heart Failure Mother   . Cancer Brother        lung  . Lung cancer Brother   . Diabetes Daughter   . Diabetes Daughter   . Breast cancer Neg Hx    Social History   Socioeconomic History  . Marital status: Widowed  Spouse name: Not on file  . Number of children: 3  . Years of education: Not on file  . Highest education level: 12th grade  Occupational History  . Occupation: Retired    Comment: Worked in Education officer, community  . Financial resource strain: Not hard at all  . Food insecurity:    Worry: Never true    Inability: Never true  . Transportation needs:    Medical: No    Non-medical: No  Tobacco Use  . Smoking status: Former Smoker    Packs/day: 1.00    Years: 30.00    Pack years: 30.00    Types: Cigarettes  . Smokeless tobacco: Never Used  . Tobacco comment: quit around 1989  Substance and Sexual Activity  . Alcohol use: No  .  Drug use: No  . Sexual activity: Not on file  Lifestyle  . Physical activity:    Days per week: 0 days    Minutes per session: 0 min  . Stress: Not at all  Relationships  . Social connections:    Talks on phone: Patient refused    Gets together: Patient refused    Attends religious service: Patient refused    Active member of club or organization: Patient refused    Attends meetings of clubs or organizations: Patient refused    Relationship status: Patient refused  Other Topics Concern  . Not on file  Social History Narrative  . Not on file    Outpatient Encounter Medications as of 12/26/2018  Medication Sig  . albuterol (VENTOLIN HFA) 108 (90 BASE) MCG/ACT inhaler Inhale 2 puffs into the lungs. Every 4-6 hours as needed  . alendronate (FOSAMAX) 70 MG tablet Take 1 tablet (70 mg total) by mouth once a week. Take with a full glass of water on an empty stomach.  Marland Kitchen amLODipine (NORVASC) 2.5 MG tablet Take 1 tablet (2.5 mg total) by mouth daily.  Marland Kitchen aspirin 81 MG tablet Take 81 mg by mouth daily.  . Calcium-Magnesium-Vitamin D (CALCIUM 500 PO) Take 1 tablet by mouth 2 (two) times daily.  . citalopram (CELEXA) 20 MG tablet TAKE 1 TABLET EVERY DAY  . Fluticasone-Salmeterol (ADVAIR) 250-50 MCG/DOSE AEPB Inhale 1 puff into the lungs 2 (two) times daily.  . furosemide (LASIX) 20 MG tablet TAKE 1 TABLET TWICE DAILY AS NEEDED  FOR  SWELLING  . gabapentin (NEURONTIN) 100 MG capsule Take 1 capsule by mouth 2 (two) times daily.  Marland Kitchen levothyroxine (SYNTHROID, LEVOTHROID) 25 MCG tablet Take 1.5 tablets (37.5 mcg total) by mouth daily before breakfast.  . meloxicam (MOBIC) 15 MG tablet TAKE ONE TABLET BY MOUTH EVERY DAY  . metoprolol succinate (TOPROL-XL) 50 MG 24 hr tablet TAKE 1/2 TABLET EVERY DAY  . Multiple Vitamins-Minerals (PRESERVISION AREDS 2+MULTI VIT) CAPS Take 1 capsule by mouth 2 (two) times daily.  . OXYGEN Inhale into the lungs Nightly.  . pantoprazole (PROTONIX) 40 MG tablet Take 1  tablet (40 mg total) by mouth daily.  . pravastatin (PRAVACHOL) 40 MG tablet TAKE 1 TABLET (40 MG TOTAL) BY MOUTH AT BEDTIME.  . roflumilast (DALIRESP) 500 MCG TABS tablet Take 500 mcg by mouth daily.  Marland Kitchen tiotropium (SPIRIVA) 18 MCG inhalation capsule Place 18 mcg into inhaler and inhale daily.   No facility-administered encounter medications on file as of 12/26/2018.     Activities of Daily Living In your present state of health, do you have any difficulty performing the following activities: 12/26/2018  Hearing? Y  Comment Wears  a hearing aid in the left ear.   Vision? N  Comment Wears eye glasses daily.   Difficulty concentrating or making decisions? N  Walking or climbing stairs? Y  Comment Avoids completely.  Dressing or bathing? N  Doing errands, shopping? Y  Comment Does not drove.  Preparing Food and eating ? Y  Comment Minimal cooking due to vision.  Using the Toilet? N  In the past six months, have you accidently leaked urine? N  Do you have problems with loss of bowel control? N  Managing your Medications? Y  Comment Friend manages medications.   Managing your Finances? Y  Comment Daughter manages finances.   Housekeeping or managing your Housekeeping? Y  Comment Minimal cleaning.   Some recent data might be hidden    Patient Care Team: Birdie Sons, MD as PCP - General (Family Medicine) Erby Pian, MD as Referring Physician (Specialist) Teodoro Spray, MD as Consulting Physician (Cardiology) Anabel Bene, MD as Referring Physician (Neurology) Estill Cotta, MD as Consulting Physician (Ophthalmology) Dasher, Rayvon Char, MD as Consulting Physician (Dermatology)    Assessment:   This is a routine wellness examination for [place].  Exercise Activities and Dietary recommendations Current Exercise Habits: The patient does not participate in regular exercise at present, Exercise limited by: orthopedic condition(s)  Goals    . DIET - INCREASE WATER  INTAKE     Recommend increasing water intake to 4-6 glasses a day.     . Increase water intake     Recommend increasing water intake to 3 glasses a day.    Marland Kitchen LIFESTYLE - DECREASE FALLS RISK     Recommend to remove any items from the home that may cause slips or trips.       Fall Risk: Fall Risk  12/26/2018 12/11/2017 12/06/2016 12/06/2016 06/09/2015  Falls in the past year? 1 Yes Yes Yes No  Number falls in past yr: 1 1 1 1  -  Injury with Fall? 1 Yes Yes Yes -  Comment black eye and bruising fractured back broken right foot - -  Follow up Falls prevention discussed Falls prevention discussed Falls prevention discussed Falls prevention discussed -    FALL RISK PREVENTION PERTAINING TO THE HOME:  Any stairs in or around the home? Yes  If so, are there any without handrails? Yes   Home free of loose throw rugs in walkways, pet beds, electrical cords, etc? Yes  Adequate lighting in your home to reduce risk of falls? Yes   ASSISTIVE DEVICES UTILIZED TO PREVENT FALLS:  Life alert? No  Use of a cane, walker or w/c? Yes  Grab bars in the bathroom? No  Shower chair or bench in shower? Yes  Elevated toilet seat or a handicapped toilet? Yes    TIMED UP AND GO:  Was the test performed? No .    Depression Screen PHQ 2/9 Scores 12/26/2018 12/26/2018 12/11/2017 12/11/2017  PHQ - 2 Score 0 0 0 0  PHQ- 9 Score 1 - 3 -     Cognitive Function     6CIT Screen 12/26/2018 12/11/2017 12/06/2016  What Year? 0 points 0 points 0 points  What month? 0 points 0 points 0 points  What time? 0 points 0 points 0 points  Count back from 20 0 points 0 points 0 points  Months in reverse 0 points 0 points 0 points  Repeat phrase 0 points 0 points 2 points  Total Score 0 0 2  Immunization History  Administered Date(s) Administered  . Influenza Split 04/16/2010, 04/22/2011, 04/23/2012, 06/01/2015  . Influenza, High Dose Seasonal PF 04/29/2016, 05/02/2017, 05/01/2018  . Influenza,inj,Quad PF,6+ Mos  04/09/2013, 04/09/2014  . Influenza-Unspecified 04/09/2013, 04/09/2014  . Pneumococcal Conjugate-13 01/14/2014  . Pneumococcal Polysaccharide-23 08/07/2000, 03/13/2006, 06/29/2007  . Tdap 04/22/2011, 12/19/2017  . Zoster 08/14/2006  . Zoster Recombinat (Shingrix) 04/12/2018, 06/17/2018    Qualifies for Shingles Vaccine? Completed series.  Tdap: Up to date  Flu Vaccine: Up to date  Pneumococcal Vaccine: Up to date  Screening Tests Health Maintenance  Topic Date Due  . DEXA SCAN  12/15/2018  . INFLUENZA VACCINE  03/08/2019  . TETANUS/TDAP  12/20/2027  . PNA vac Low Risk Adult  Completed    Cancer Screenings:  Colorectal Screening: No longer required.   Mammogram: No longer required.   Bone Density: Completed 12/14/16. Results reflect OSTEOPOROSIS. Repeat every 2 years. Ordered today. Pt provided with contact info and advised to call to schedule appt. Pt aware the office will call re: appt.  Lung Cancer Screening: (Low Dose CT Chest recommended if Age 71-80 years, 30 pack-year currently smoking OR have quit w/in 15years.) does not qualify.   Additional Screening:  Vision Screening: Recommended annual ophthalmology exams for early detection of glaucoma and other disorders of the eye.  Dental Screening: Recommended annual dental exams for proper oral hygiene  Community Resource Referral:  CRR required this visit?  No       Plan:  I have personally reviewed and addressed the Medicare Annual Wellness questionnaire and have noted the following in the patient's chart:  A. Medical and social history B. Use of alcohol, tobacco or illicit drugs  C. Current medications and supplements D. Functional ability and status E.  Nutritional status F.  Physical activity G. Advance directives H. List of other physicians I.  Hospitalizations, surgeries, and ER visits in previous 12 months J.  [place] such as hearing and vision if needed, cognitive and depression L.  Referrals and appointments   In addition, I have reviewed and discussed with patient certain preventive protocols, quality metrics, and best practice recommendations. A written personalized care plan for preventive services as well as general preventive health recommendations were provided to patient. Nurse Health Advisor  Signed,    Joniah Bednarski [place], [place]  [id number] Nurse Health Advisor   Nurse Notes: None.

## 2018-12-26 NOTE — Patient Instructions (Signed)
Toni White , Thank you for taking time to come for your Medicare Wellness Visit. I appreciate your ongoing commitment to your health goals. Please review the following plan we discussed and let me know if I can assist you in the future.   Screening recommendations/referrals: Colonoscopy: No longer required.  Mammogram: No longer required.  Bone Density: Ordered today. Pt provided with contact info and advised to call to schedule appt. Pt aware the office will call re: appt. Recommended yearly ophthalmology/optometry visit for glaucoma screening and checkup Recommended yearly dental visit for hygiene and checkup  Vaccinations: Influenza vaccine: Up to date Pneumococcal vaccine: Completed series Tdap vaccine: Up to date, due 12/2027 Shingles vaccine: Completed series    Advanced directives: Please bring a copy of your POA (Power of Attorney) and/or Living Will to your next appointment.   Conditions/risks identified: Fall risk prevention discussed with you today. Recommend to increase water intake to 6-8 8 oz glasses a day.   Next appointment: 03/24/19 @ 9:00 AM with Dr Caryn Section.    Preventive Care 60 Years and Older, Female Preventive care refers to lifestyle choices and visits with your health care provider that can promote health and wellness. What does preventive care include?  A yearly physical exam. This is also called an annual well check.  Dental exams once or twice a year.  Routine eye exams. Ask your health care provider how often you should have your eyes checked.  Personal lifestyle choices, including:  Daily care of your teeth and gums.  Regular physical activity.  Eating a healthy diet.  Avoiding tobacco and drug use.  Limiting alcohol use.  Practicing safe sex.  Taking low-dose aspirin every day.  Taking vitamin and mineral supplements as recommended by your health care provider. What happens during an annual well check? The services and screenings done by  your health care provider during your annual well check will depend on your age, overall health, lifestyle risk factors, and family history of disease. Counseling  Your health care provider may ask you questions about your:  Alcohol use.  Tobacco use.  Drug use.  Emotional well-being.  Home and relationship well-being.  Sexual activity.  Eating habits.  History of falls.  Memory and ability to understand (cognition).  Work and work Statistician.  Reproductive health. Screening  You may have the following tests or measurements:  Height, weight, and BMI.  Blood pressure.  Lipid and cholesterol levels. These may be checked every 5 years, or more frequently if you are over 12 years old.  Skin check.  Lung cancer screening. You may have this screening every year starting at age 22 if you have a 30-pack-year history of smoking and currently smoke or have quit within the past 15 years.  Fecal occult blood test (FOBT) of the stool. You may have this test every year starting at age 36.  Flexible sigmoidoscopy or colonoscopy. You may have a sigmoidoscopy every 5 years or a colonoscopy every 10 years starting at age 62.  Hepatitis C blood test.  Hepatitis B blood test.  Sexually transmitted disease (STD) testing.  Diabetes screening. This is done by checking your blood sugar (glucose) after you have not eaten for a while (fasting). You may have this done every 1-3 years.  Bone density scan. This is done to screen for osteoporosis. You may have this done starting at age 6.  Mammogram. This may be done every 1-2 years. Talk to your health care provider about how often you should  have regular mammograms. Talk with your health care provider about your test results, treatment options, and if necessary, the need for more tests. Vaccines  Your health care provider may recommend certain vaccines, such as:  Influenza vaccine. This is recommended every year.  Tetanus,  diphtheria, and acellular pertussis (Tdap, Td) vaccine. You may need a Td booster every 10 years.  Zoster vaccine. You may need this after age 34.  Pneumococcal 13-valent conjugate (PCV13) vaccine. One dose is recommended after age 30.  Pneumococcal polysaccharide (PPSV23) vaccine. One dose is recommended after age 55. Talk to your health care provider about which screenings and vaccines you need and how often you need them. This information is not intended to replace advice given to you by your health care provider. Make sure you discuss any questions you have with your health care provider. Document Released: 08/20/2015 Document Revised: 04/12/2016 Document Reviewed: 05/25/2015 Elsevier Interactive Patient Education  2017 [place] Prevention in the Home Falls can cause injuries. They can happen to people of all ages. There are many things you can do to make your home safe and to help prevent falls. What can I do on the outside of my home?  Regularly fix the edges of walkways and driveways and fix any cracks.  Remove anything that might make you trip as you walk through a door, such as a raised step or threshold.  Trim any bushes or trees on the path to your home.  Use bright outdoor lighting.  Clear any walking paths of anything that might make someone trip, such as rocks or tools.  Regularly check to see if handrails are loose or broken. Make sure that both sides of any steps have handrails.  Any raised decks and porches should have guardrails on the edges.  Have any leaves, snow, or ice cleared regularly.  Use sand or salt on walking paths during winter.  Clean up any spills in your garage right away. This includes oil or grease spills. What can I do in the bathroom?  Use night lights.  Install grab bars by the toilet and in the tub and shower. Do not use towel bars as grab bars.  Use non-skid mats or decals in the tub or shower.  If you need to sit down in  the shower, use a plastic, non-slip stool.  Keep the floor dry. Clean up any water that spills on the floor as soon as it happens.  Remove soap buildup in the tub or shower regularly.  Attach bath mats securely with double-sided non-slip rug tape.  Do not have throw rugs and other things on the floor that can make you trip. What can I do in the bedroom?  Use night lights.  Make sure that you have a light by your bed that is easy to reach.  Do not use any sheets or blankets that are too big for your bed. They should not hang down onto the floor.  Have a firm chair that has side arms. You can use this for support while you get dressed.  Do not have throw rugs and other things on the floor that can make you trip. What can I do in the kitchen?  Clean up any spills right away.  Avoid walking on wet floors.  Keep items that you use a lot in easy-to-reach places.  If you need to reach something above you, use a strong step stool that has a grab bar.  Keep electrical cords out of  the way.  Do not use floor polish or wax that makes floors slippery. If you must use wax, use non-skid floor wax.  Do not have throw rugs and other things on the floor that can make you trip. What can I do with my stairs?  Do not leave any items on the stairs.  Make sure that there are handrails on both sides of the stairs and use them. Fix handrails that are broken or loose. Make sure that handrails are as long as the stairways.  Check any carpeting to make sure that it is firmly attached to the stairs. Fix any carpet that is loose or worn.  Avoid having throw rugs at the top or bottom of the stairs. If you do have throw rugs, attach them to the floor with carpet tape.  Make sure that you have a light switch at the top of the stairs and the bottom of the stairs. If you do not have them, ask someone to add them for you. What else can I do to help prevent falls?  Wear shoes that:  Do not have high  heels.  Have rubber bottoms.  Are comfortable and fit you well.  Are closed at the toe. Do not wear sandals.  If you use a stepladder:  Make sure that it is fully opened. Do not climb a closed stepladder.  Make sure that both sides of the stepladder are locked into place.  Ask someone to hold it for you, if possible.  Clearly mark and make sure that you can see:  Any grab bars or handrails.  First and last steps.  Where the edge of each step is.  Use tools that help you move around (mobility aids) if they are needed. These include:  Canes.  Walkers.  Scooters.  Crutches.  Turn on the lights when you go into a dark area. Replace any light bulbs as soon as they burn out.  Set up your furniture so you have a clear path. Avoid moving your furniture around.  If any of your floors are uneven, fix them.  If there are any pets around you, be aware of where they are.  Review your medicines with your doctor. Some medicines can make you feel dizzy. This can increase your chance of falling. Ask your doctor what other things that you can do to help prevent falls. This information is not intended to replace advice given to you by your health care provider. Make sure you discuss any questions you have with your health care provider. Document Released: 05/20/2009 Document Revised: 12/30/2015 Document Reviewed: 08/28/2014 Elsevier Interactive Patient Education  2017 [employer].

## 2018-12-30 ENCOUNTER — Other Ambulatory Visit: Payer: Self-pay | Admitting: Family Medicine

## 2018-12-30 DIAGNOSIS — E785 Hyperlipidemia, unspecified: Secondary | ICD-10-CM

## 2019-01-02 DIAGNOSIS — M10472 Other secondary gout, left ankle and foot: Secondary | ICD-10-CM | POA: Diagnosis not present

## 2019-01-02 DIAGNOSIS — J449 Chronic obstructive pulmonary disease, unspecified: Secondary | ICD-10-CM | POA: Diagnosis not present

## 2019-01-02 DIAGNOSIS — R0602 Shortness of breath: Secondary | ICD-10-CM | POA: Diagnosis not present

## 2019-01-02 DIAGNOSIS — R0902 Hypoxemia: Secondary | ICD-10-CM | POA: Diagnosis not present

## 2019-01-09 DIAGNOSIS — J449 Chronic obstructive pulmonary disease, unspecified: Secondary | ICD-10-CM | POA: Diagnosis not present

## 2019-02-05 DIAGNOSIS — H40003 Preglaucoma, unspecified, bilateral: Secondary | ICD-10-CM | POA: Diagnosis not present

## 2019-02-05 LAB — HM DIABETES EYE EXAM

## 2019-02-08 DIAGNOSIS — J449 Chronic obstructive pulmonary disease, unspecified: Secondary | ICD-10-CM | POA: Diagnosis not present

## 2019-02-10 ENCOUNTER — Encounter: Payer: Self-pay | Admitting: Family Medicine

## 2019-02-12 DIAGNOSIS — D2261 Melanocytic nevi of right upper limb, including shoulder: Secondary | ICD-10-CM | POA: Diagnosis not present

## 2019-02-12 DIAGNOSIS — D2262 Melanocytic nevi of left upper limb, including shoulder: Secondary | ICD-10-CM | POA: Diagnosis not present

## 2019-02-12 DIAGNOSIS — D225 Melanocytic nevi of trunk: Secondary | ICD-10-CM | POA: Diagnosis not present

## 2019-02-12 DIAGNOSIS — D2272 Melanocytic nevi of left lower limb, including hip: Secondary | ICD-10-CM | POA: Diagnosis not present

## 2019-02-12 DIAGNOSIS — D2271 Melanocytic nevi of right lower limb, including hip: Secondary | ICD-10-CM | POA: Diagnosis not present

## 2019-02-12 DIAGNOSIS — Z08 Encounter for follow-up examination after completed treatment for malignant neoplasm: Secondary | ICD-10-CM | POA: Diagnosis not present

## 2019-02-12 DIAGNOSIS — L821 Other seborrheic keratosis: Secondary | ICD-10-CM | POA: Diagnosis not present

## 2019-02-12 DIAGNOSIS — Z85828 Personal history of other malignant neoplasm of skin: Secondary | ICD-10-CM | POA: Diagnosis not present

## 2019-02-19 ENCOUNTER — Other Ambulatory Visit: Payer: Self-pay

## 2019-02-19 ENCOUNTER — Ambulatory Visit
Admission: RE | Admit: 2019-02-19 | Discharge: 2019-02-19 | Disposition: A | Discharge status: Home / Self Care | Payer: Medicare HMO | Source: Ambulatory Visit | Attending: Family Medicine | Admitting: Family Medicine

## 2019-02-19 DIAGNOSIS — M810 Age-related osteoporosis without current pathological fracture: Secondary | ICD-10-CM | POA: Insufficient documentation

## 2019-02-19 DIAGNOSIS — M85852 Other specified disorders of bone density and structure, left thigh: Secondary | ICD-10-CM | POA: Diagnosis not present

## 2019-02-20 ENCOUNTER — Telehealth: Payer: Self-pay

## 2019-02-20 NOTE — Telephone Encounter (Signed)
-----   Message from Birdie Sons, MD sent at 02/19/2019  8:30 PM EDT ----- Patient has osteoporosis which is stable. Continue alendronate once weekly. Repeat in 2 years.

## 2019-02-20 NOTE — Telephone Encounter (Signed)
Pt advised.   Thanks,   -Laura  

## 2019-02-27 ENCOUNTER — Other Ambulatory Visit: Payer: Self-pay | Admitting: Family Medicine

## 2019-03-11 DIAGNOSIS — J449 Chronic obstructive pulmonary disease, unspecified: Secondary | ICD-10-CM | POA: Diagnosis not present

## 2019-03-24 ENCOUNTER — Other Ambulatory Visit: Payer: Self-pay | Admitting: Family Medicine

## 2019-03-24 ENCOUNTER — Encounter: Payer: Medicare HMO | Admitting: Family Medicine

## 2019-03-24 MED ORDER — GABAPENTIN 100 MG PO CAPS: 100.0000 mg | ORAL_CAPSULE | Freq: Two times a day (BID) | ORAL | 3 refills | Status: AC

## 2019-03-24 NOTE — Telephone Encounter (Signed)
[facility] faxed refill request for the following medications:  gabapentin (NEURONTIN) 100 MG capsule   Please advise. 

## 2019-04-11 DIAGNOSIS — J449 Chronic obstructive pulmonary disease, unspecified: Secondary | ICD-10-CM | POA: Diagnosis not present

## 2019-04-12 ENCOUNTER — Other Ambulatory Visit: Payer: Self-pay | Admitting: Family Medicine

## 2019-04-23 ENCOUNTER — Encounter: Payer: Self-pay | Admitting: Family Medicine

## 2019-04-23 ENCOUNTER — Ambulatory Visit (INDEPENDENT_AMBULATORY_CARE_PROVIDER_SITE_OTHER): Payer: Medicare HMO | Admitting: Family Medicine

## 2019-04-23 ENCOUNTER — Other Ambulatory Visit: Payer: Self-pay

## 2019-04-23 VITALS — BP 122/84 | HR 79 | Temp 97.1°F | Resp 16 | Wt 167.0 lb

## 2019-04-23 DIAGNOSIS — M810 Age-related osteoporosis without current pathological fracture: Secondary | ICD-10-CM | POA: Diagnosis not present

## 2019-04-23 DIAGNOSIS — Z23 Encounter for immunization: Secondary | ICD-10-CM

## 2019-04-23 DIAGNOSIS — Z0000 Encounter for general adult medical examination without abnormal findings: Secondary | ICD-10-CM | POA: Diagnosis not present

## 2019-04-23 DIAGNOSIS — I10 Essential (primary) hypertension: Secondary | ICD-10-CM

## 2019-04-23 DIAGNOSIS — R7303 Prediabetes: Secondary | ICD-10-CM | POA: Diagnosis not present

## 2019-04-23 DIAGNOSIS — J449 Chronic obstructive pulmonary disease, unspecified: Secondary | ICD-10-CM

## 2019-04-23 DIAGNOSIS — E785 Hyperlipidemia, unspecified: Secondary | ICD-10-CM

## 2019-04-23 DIAGNOSIS — F3341 Major depressive disorder, recurrent, in partial remission: Secondary | ICD-10-CM | POA: Diagnosis not present

## 2019-04-23 NOTE — Progress Notes (Signed)
Patient: Toni White, Female    DOB: [birth year], 83 y.o.   MRN: [id number] Visit Date: 04/23/2019  Today's Provider: Lelon Huh, MD   Chief Complaint  Patient presents with  . Annual Exam   Subjective:     Annual physical exam Toni White is a 83 y.o. female. She feels well. She reports she is not exercising . She reports she is sleeping well.  ----------------------------------------------------------- She continues to use  Of Daliresp and Spiriva and Advair daily for COPD managed by Dr. [name], and reports she rarely requires her albuterol inhaler.   She continues on citalopram for major depression and reports her mood has been good and is not feeling depressed or anxious.   She continues on her medications for chronic hypertension, hypothyroidism, hyperlipidemia. She continues on alendronate and vitamin d for alendronate for osteoporosis and vitamin d deficiency and reports she is tolerating them well. Also states she is doing well on pantoprazole for reflux symptoms which are completely controlled.   Review of Systems  Constitutional: Negative for appetite change, chills, fatigue and fever.  Respiratory: Negative for chest tightness and shortness of breath.   Cardiovascular: Negative for chest pain and palpitations.  Gastrointestinal: Negative for abdominal pain, nausea and vomiting.  Neurological: Negative for dizziness and weakness.  All other systems reviewed and are negative.   Social History   Socioeconomic History  . Marital status: Widowed    Spouse name: Not on file  . Number of children: 3  . Years of education: Not on file  . Highest education level: 12th grade  Occupational History  . Occupation: Retired    Comment: Worked in Education officer, community  . Financial resource strain: Not hard at all  . Food insecurity    Worry: Never true    Inability: Never true  . Transportation needs    Medical: No    Non-medical: No  Tobacco Use  .  Smoking status: Former Smoker    Packs/day: 1.00    Years: 30.00    Pack years: 30.00    Types: Cigarettes  . Smokeless tobacco: Never Used  . Tobacco comment: quit around 1989  Substance and Sexual Activity  . Alcohol use: No  . Drug use: No  . Sexual activity: Not on file  Lifestyle  . Physical activity    Days per week: 0 days    Minutes per session: 0 min  . Stress: Not at all  Relationships  . Social Herbalist on phone: Patient refused    Gets together: Patient refused    Attends religious service: Patient refused    Active member of club or organization: Patient refused    Attends meetings of clubs or organizations: Patient refused    Relationship status: Patient refused  . Intimate partner violence    Fear of current or ex partner: Patient refused    Emotionally abused: Patient refused    Physically abused: Patient refused    Forced sexual activity: Patient refused  Other Topics Concern  . Not on file  Social History Narrative  . Not on file    Past Medical History:  Diagnosis Date  . COPD (chronic obstructive pulmonary disease) ([place])   . History of chicken pox   . Hyperlipidemia   . Hypertension   . Ulcer      Patient Active Problem List   Diagnosis Date Noted  . Varicose veins of both legs with edema 05/02/2017  .  Recurrent major depressive disorder, in partial remission ([place]) 06/08/2015  . Hypersomnolence 06/08/2015  . Macular degeneration 06/08/2015  . Sciatica 06/08/2015  . LBP (low back pain) 06/08/2015  . OP (osteoporosis) 06/08/2015  . Bibasilar crackles 06/08/2015  . Edema extremities 06/08/2015  . Tremor 03/17/2015  . 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 Diffuse cystic mastopathy 06/26/2013  . Myalgia and myositis 07/06/2009  . Hypoxemia 07/06/2009  . Hyperlipidemia 05/19/2008  . Prediabetes 12/18/2007  . One eye: total vision impairment; other eye: normal vision 06/29/2007  . Essential (primary) hypertension  08/07/1998  . Chronic obstructive pulmonary disease ([place]) 08/07/1998    Past Surgical History:  Procedure Laterality Date  . ABDOMINAL HYSTERECTOMY  2003   with BSO due to bleeding  . APPENDECTOMY    . BREAST BIOPSY Left 2005   stereo breast biopsy   . BREAST BIOPSY Right 2005   core bx. benign  . BREAST BIOPSY Left 2000?   benign  . BREAST CYST EXCISION Left 1990   incision and draniage of abscess  . CHOLECYSTECTOMY    . COLONOSCOPY  2003   Dr. Vira Agar. Hyperplastic polyps; Single polyp excision from rectum  . CT Scan of Brain  08/20/2013   Mild diffuse cortical atrophy. Mid chronic ischemic white matter disease. Wide neck basilar tip aneurysm 6x6x23mm on follow up CTA  . [place]  . INCISION AND DRAINAGE BREAST ABSCESS Left 1990  . TONSILLECTOMY  1952  . UPPER GI ENDOSCOPY  02/26/2002   Dr. Tiffany Kocher; Gastritis. Single gastric ectasia    Her family history includes Cancer in her brother; Congestive Heart Failure in her father and mother; Diabetes in her daughter and daughter; Heart disease in her father and mother; Lung cancer in her brother. There is no history of Breast cancer.   Current Outpatient Medications:  .  albuterol (VENTOLIN HFA) 108 (90 BASE) MCG/ACT inhaler, Inhale 2 puffs into the lungs. Every 4-6 hours as needed, Disp: , Rfl:  .  alendronate (FOSAMAX) 70 MG tablet, Take 1 tablet (70 mg total) by mouth once a week. Take with a full glass of water on an empty stomach., Disp: 12 tablet, Rfl: 4 .  amLODipine (NORVASC) 2.5 MG tablet, Take 1 tablet (2.5 mg total) by mouth daily., Disp: 90 tablet, Rfl: 4 .  aspirin 81 MG tablet, Take 81 mg by mouth daily., Disp: , Rfl:  .  Calcium-Magnesium-Vitamin D (CALCIUM 500 PO), Take 1 tablet by mouth 2 (two) times daily., Disp: , Rfl:  .  citalopram (CELEXA) 20 MG tablet, TAKE 1 TABLET EVERY DAY, Disp: , Rfl:  .  Fluticasone-Salmeterol (ADVAIR) 250-50 MCG/DOSE AEPB, Inhale 1 puff into the lungs 2 (two) times  daily., Disp: , Rfl:  .  furosemide (LASIX) 20 MG tablet, TAKE 1 TABLET TWICE DAILY AS NEEDED  FOR  SWELLING, Disp: 90 tablet, Rfl: 3 .  gabapentin (NEURONTIN) 100 MG capsule, Take 1 capsule (100 mg total) by mouth 2 (two) times daily., Disp: 180 capsule, Rfl: 3 .  levothyroxine (SYNTHROID, LEVOTHROID) 25 MCG tablet, Take 1.5 tablets (37.5 mcg total) by mouth daily before breakfast., Disp: 135 tablet, Rfl: 4 .  meloxicam (MOBIC) 15 MG tablet, TAKE ONE TABLET BY MOUTH EVERY DAY, Disp: 30 tablet, Rfl: 5 .  metoprolol succinate (TOPROL-XL) 50 MG 24 hr tablet, TAKE 1/2 TABLET EVERY DAY, Disp: 45 tablet, Rfl: 5 .  Multiple Vitamins-Minerals (PRESERVISION AREDS 2+MULTI VIT) CAPS, Take 1 capsule by mouth 2 (two) times daily., Disp: ,  Rfl:  .  OXYGEN, Inhale into the lungs Nightly., Disp: , Rfl:  .  pantoprazole (PROTONIX) 40 MG tablet, Take 1 tablet (40 mg total) by mouth daily., Disp: 90 tablet, Rfl: 3 .  pravastatin (PRAVACHOL) 40 MG tablet, TAKE 1 TABLET (40 MG TOTAL) BY MOUTH AT BEDTIME., Disp: 90 tablet, Rfl: 4 .  roflumilast (DALIRESP) 500 MCG TABS tablet, Take 500 mcg by mouth daily., Disp: , Rfl:  .  tiotropium (SPIRIVA) 18 MCG inhalation capsule, Place 18 mcg into inhaler and inhale daily., Disp: , Rfl:  .  traMADol (ULTRAM) 50 MG tablet, TAKE ONE TABLET EVERY EIGHT HOURS, Disp: 30 tablet, Rfl: 3  Patient Care Team: Birdie Sons, MD as PCP - General (Family Medicine) Erby Pian, MD as Referring Physician (Specialist) Teodoro Spray, MD as Consulting Physician (Cardiology) Anabel Bene, MD as Referring Physician (Neurology) Dingeldein, Remo Lipps, MD as Consulting Physician (Ophthalmology) Dasher, Rayvon Char, MD as Consulting Physician (Dermatology)    Objective:    Vitals: BP 122/84   Pulse 79   Temp (!) 97.1 F (36.2 C) (Oral)   Resp 16   Wt 167 lb (75.8 kg) Comment: patient reports  SpO2 95% Comment: on 2 liters of o2  BMI 31.55 kg/m   Physical Exam   General  Appearance:    Alert, cooperative, no distress, appears stated age  Head:    Normocephalic, without obvious abnormality, atraumatic  Eyes:    PERRL, conjunctiva/corneas clear, EOM's intact, fundi    benign, both eyes  Ears:    Normal TM's and external ear canals, both ears  Nose:   Nares normal, septum midline, mucosa normal, no drainage    or sinus tenderness  Throat:   Lips, mucosa, and tongue normal; teeth and gums normal  Neck:   Supple, symmetrical, trachea midline, no adenopathy;    thyroid:  no enlargement/tenderness/nodules; no carotid   bruit or JVD  Back:     Symmetric, no curvature, ROM normal, no CVA tenderness  Lungs:     Clear to auscultation bilaterally, respirations unlabored  Chest Wall:    No tenderness or deformity   Heart:    Normal heart rate. Normal rhythm. No murmurs, rubs, or gallops.   Breast Exam:    patient refused  Abdomen:     Soft, non-tender, bowel sounds active all four quadrants,    no masses, no organomegaly  Pelvic:    deferred  Extremities:   All extremities are intact. No cyanosis or edema  Pulses:   2+ and symmetric all extremities  Skin:   Skin color, texture, turgor normal, no rashes or lesions  Lymph nodes:   Cervical, supraclavicular, and axillary nodes normal  Neurologic:   CNII-XII intact, normal strength, sensation and reflexes    throughout      Depression Screen PHQ 2/9 Scores 04/23/2019 12/26/2018 12/26/2018 12/11/2017  PHQ - 2 Score 2 0 0 0  PHQ- 9 Score 6 1 - 3        Assessment & Plan:   1. Annual physical exam Refused breast exam but is UTD on mammograms.   2. Chronic obstructive pulmonary disease, unspecified COPD type ([place]) Well controlled on current regiment, follow up Dr. [name] as scheduled.   3. Osteoporosis, unspecified osteoporosis type, unspecified pathological fracture presence Continue alendronate. Dexa in 2022 - VITAMIN D 25 Hydroxy (Vit-D Deficiency, Fractures)  4. Recurrent major depressive disorder, in  partial remission ([place]) Doing well on citalopram, continue current dose  5. Prediabetes  -  Hemoglobin A1c - TSH  6. Hyperlipidemia, unspecified hyperlipidemia type She is tolerating pravastatin well with no adverse effects.   - Comprehensive metabolic panel - Lipid panel - TSH  7. Essential (primary) hypertension Well controlled.  Continue current medications.    8. Need for influenza vaccination  - Flu Vaccine QUAD High Dose(Fluad)  The entirety of the information documented in the History of Present Illness, Review of Systems and Physical Exam were personally obtained by me. Portions of this information were initially documented by Minette Headland, CMA and reviewed by me for thoroughness and accuracy.    Lelon Huh, MD  [facility]

## 2019-04-24 ENCOUNTER — Telehealth: Payer: Self-pay

## 2019-04-24 LAB — LIPID PANEL
Chol/HDL Ratio: 3.5 ratio (ref 0.0–4.4)
Cholesterol, Total: 144 mg/dL (ref 100–199)
HDL: 41 mg/dL (ref >39)
LDL Chol Calc (NIH): 79 mg/dL (ref 0–99)
Triglycerides: 139 mg/dL (ref 0–149)
VLDL Cholesterol Cal: 24 mg/dL (ref 5–40)

## 2019-04-24 LAB — COMPREHENSIVE METABOLIC PANEL
ALT: 20 IU/L (ref 0–32)
AST: 24 IU/L (ref 0–40)
Albumin/Globulin Ratio: 1.4 (ref 1.2–2.2)
Albumin: 3.9 g/dL (ref 3.6–4.6)
Alkaline Phosphatase: 62 IU/L (ref 39–117)
BUN/Creatinine Ratio: 26 (ref 12–28)
BUN: 22 mg/dL (ref 8–27)
Bilirubin Total: 0.4 mg/dL (ref 0.0–1.2)
CO2: 27 mmol/L (ref 20–29)
Calcium: 10 mg/dL (ref 8.7–10.3)
Chloride: 102 mmol/L (ref 96–106)
Creatinine, Ser: 0.85 mg/dL (ref 0.57–1.00)
GFR calc Af Amer: 74 mL/min/{1.73_m2} (ref >59)
GFR calc non Af Amer: 64 mL/min/{1.73_m2} (ref >59)
Globulin, Total: 2.8 g/dL (ref 1.5–4.5)
Glucose: 84 mg/dL (ref 65–99)
Potassium: 5.2 mmol/L (ref 3.5–5.2)
Sodium: 141 mmol/L (ref 134–144)
Total Protein: 6.7 g/dL (ref 6.0–8.5)

## 2019-04-24 LAB — HEMOGLOBIN A1C
Est. average glucose Bld gHb Est-mCnc: 117 mg/dL
Hgb A1c MFr Bld: 5.7 % — ABNORMAL HIGH (ref 4.8–5.6)

## 2019-04-24 LAB — VITAMIN D 25 HYDROXY (VIT D DEFICIENCY, FRACTURES): Vit D, 25-Hydroxy: 48.6 ng/mL (ref 30.0–100.0)

## 2019-04-24 LAB — TSH: TSH: 2.38 u[IU]/mL (ref 0.450–4.500)

## 2019-04-24 NOTE — Telephone Encounter (Signed)
Pt advised.   Thanks,   -Laura  

## 2019-04-24 NOTE — Telephone Encounter (Signed)
-----   Message from Birdie Sons, MD sent at 04/24/2019  8:16 AM EDT ----- Labs are all very good. Continue current medications.  Check yearly.

## 2019-05-04 NOTE — Patient Instructions (Signed)
.   Please review the attached list of medications and notify my office if there are any errors.   . Please bring all of your medications to every appointment so we can make sure that our medication list is the same as yours.   . It is especially important to get the annual flu vaccine this year. If you haven't had it already, please go to your pharmacy or call the office as soon as possible to schedule you flu shot.  

## 2019-05-11 DIAGNOSIS — J449 Chronic obstructive pulmonary disease, unspecified: Secondary | ICD-10-CM | POA: Diagnosis not present

## 2019-05-20 DIAGNOSIS — Z9981 Dependence on supplemental oxygen: Secondary | ICD-10-CM | POA: Diagnosis not present

## 2019-05-20 DIAGNOSIS — R0600 Dyspnea, unspecified: Secondary | ICD-10-CM | POA: Diagnosis not present

## 2019-05-20 DIAGNOSIS — J449 Chronic obstructive pulmonary disease, unspecified: Secondary | ICD-10-CM | POA: Diagnosis not present

## 2019-06-04 DIAGNOSIS — E119 Type 2 diabetes mellitus without complications: Secondary | ICD-10-CM | POA: Diagnosis not present

## 2019-06-04 DIAGNOSIS — I10 Essential (primary) hypertension: Secondary | ICD-10-CM | POA: Diagnosis not present

## 2019-06-04 DIAGNOSIS — E7801 Familial hypercholesterolemia: Secondary | ICD-10-CM | POA: Diagnosis not present

## 2019-06-04 DIAGNOSIS — I725 Aneurysm of other precerebral arteries: Secondary | ICD-10-CM | POA: Diagnosis not present

## 2019-06-04 DIAGNOSIS — J449 Chronic obstructive pulmonary disease, unspecified: Secondary | ICD-10-CM | POA: Diagnosis not present

## 2019-06-04 DIAGNOSIS — J439 Emphysema, unspecified: Secondary | ICD-10-CM | POA: Diagnosis not present

## 2019-06-05 ENCOUNTER — Other Ambulatory Visit: Payer: Self-pay | Admitting: Family Medicine

## 2019-06-09 ENCOUNTER — Other Ambulatory Visit: Payer: Self-pay | Admitting: Family Medicine

## 2019-06-09 DIAGNOSIS — Z1231 Encounter for screening mammogram for malignant neoplasm of breast: Secondary | ICD-10-CM

## 2019-06-11 DIAGNOSIS — J449 Chronic obstructive pulmonary disease, unspecified: Secondary | ICD-10-CM | POA: Diagnosis not present

## 2019-06-23 ENCOUNTER — Other Ambulatory Visit: Payer: Self-pay | Admitting: Family Medicine

## 2019-06-23 NOTE — Telephone Encounter (Signed)
prescription for furosemide 20 mg expired on 05/30/2019.

## 2019-06-24 NOTE — Telephone Encounter (Signed)
From [facility], please review 

## 2019-07-11 DIAGNOSIS — J449 Chronic obstructive pulmonary disease, unspecified: Secondary | ICD-10-CM | POA: Diagnosis not present

## 2019-07-23 ENCOUNTER — Other Ambulatory Visit: Payer: Self-pay | Admitting: Family Medicine

## 2019-07-24 NOTE — Telephone Encounter (Signed)
Requested Prescriptions  Pending Prescriptions Disp Refills  . pantoprazole (PROTONIX) 40 MG tablet [Pharmacy Med Name: PANTOPRAZOLE SODIUM 40 MG Tablet Delayed Release] 90 tablet 3    Sig: TAKE 1 TABLET EVERY DAY     Gastroenterology: Proton Pump Inhibitors Passed - 07/23/2019 11:38 PM      Passed - Valid encounter within last 12 months    Recent Outpatient Visits          3 months ago Annual physical exam   [facility] Birdie Sons, MD   1 year ago Choking, initial encounter   [place], [place]   1 year ago Laceration of left middle finger without foreign body without damage to nail, initial encounter   [facility] Birdie Sons, MD   1 year ago Contusion of left temporofrontal scalp, initial encounter   [facility] Birdie Sons, MD   1 year ago Annual physical exam   [facility] Birdie Sons, MD      Future Appointments            In 5 months [facility], [place]

## 2019-08-06 DIAGNOSIS — H25012 Cortical age-related cataract, left eye: Secondary | ICD-10-CM | POA: Diagnosis not present

## 2019-08-11 DIAGNOSIS — J449 Chronic obstructive pulmonary disease, unspecified: Secondary | ICD-10-CM | POA: Diagnosis not present

## 2019-09-11 DIAGNOSIS — J449 Chronic obstructive pulmonary disease, unspecified: Secondary | ICD-10-CM | POA: Diagnosis not present

## 2019-09-17 ENCOUNTER — Ambulatory Visit
Admission: RE | Admit: 2019-09-17 | Discharge: 2019-09-17 | Disposition: A | Discharge status: Home / Self Care | Payer: Medicare HMO | Source: Ambulatory Visit | Attending: Family Medicine | Admitting: Family Medicine

## 2019-09-17 DIAGNOSIS — Z1231 Encounter for screening mammogram for malignant neoplasm of breast: Secondary | ICD-10-CM | POA: Insufficient documentation

## 2019-10-09 DIAGNOSIS — J449 Chronic obstructive pulmonary disease, unspecified: Secondary | ICD-10-CM | POA: Diagnosis not present

## 2019-10-15 DIAGNOSIS — R079 Chest pain, unspecified: Secondary | ICD-10-CM | POA: Diagnosis not present

## 2019-10-15 DIAGNOSIS — J432 Centrilobular emphysema: Secondary | ICD-10-CM | POA: Diagnosis not present

## 2019-10-15 DIAGNOSIS — Z9981 Dependence on supplemental oxygen: Secondary | ICD-10-CM | POA: Diagnosis not present

## 2019-10-15 DIAGNOSIS — R0602 Shortness of breath: Secondary | ICD-10-CM | POA: Diagnosis not present

## 2019-10-27 ENCOUNTER — Other Ambulatory Visit: Payer: Self-pay | Admitting: Family Medicine

## 2019-11-06 DIAGNOSIS — Z66 Do not resuscitate: Secondary | ICD-10-CM

## 2019-11-06 HISTORY — DX: Do not resuscitate: Z66

## 2019-11-09 DIAGNOSIS — J449 Chronic obstructive pulmonary disease, unspecified: Secondary | ICD-10-CM | POA: Diagnosis not present

## 2019-11-13 ENCOUNTER — Telehealth: Payer: Self-pay | Admitting: Family Medicine

## 2019-11-13 NOTE — Telephone Encounter (Signed)
There is no history of gout in her record or any gout medications in her medication list.

## 2019-11-13 NOTE — Telephone Encounter (Signed)
Tried Medical laboratory scientific officer. Left message to call back. OK for [facility] triage to advise.

## 2019-11-13 NOTE — Telephone Encounter (Signed)
Pt has Gout and would like medication called in that Dr. Caryn Section usually gives her for it/ please advise asap and call her daughter [name]

## 2019-11-17 NOTE — Telephone Encounter (Signed)
[name] advised as below.

## 2019-11-20 ENCOUNTER — Encounter: Payer: Self-pay | Admitting: Physician Assistant

## 2019-11-20 ENCOUNTER — Other Ambulatory Visit: Payer: Self-pay

## 2019-11-20 ENCOUNTER — Ambulatory Visit (INDEPENDENT_AMBULATORY_CARE_PROVIDER_SITE_OTHER): Payer: Medicare HMO | Admitting: Physician Assistant

## 2019-11-20 VITALS — BP 131/80 | HR 82 | Temp 97.1°F | Resp 14 | Ht 64.0 in | Wt 169.0 lb

## 2019-11-20 DIAGNOSIS — M10071 Idiopathic gout, right ankle and foot: Secondary | ICD-10-CM | POA: Diagnosis not present

## 2019-11-20 MED ORDER — PREDNISONE 20 MG PO TABS: 40.0000 mg | ORAL_TABLET | Freq: Every day | ORAL | 0 refills | Status: DC

## 2019-11-20 NOTE — Patient Instructions (Signed)
Gout  Gout is painful swelling of your joints. Gout is a type of arthritis. It is caused by having too much uric acid in your body. Uric acid is a chemical that is made when your body breaks down substances called purines. If your body has too much uric acid, sharp crystals can form and build up in your joints. This causes pain and swelling. Gout attacks can happen quickly and be very painful (acute gout). Over time, the attacks can affect more joints and happen more often (chronic gout). What are the causes?  Too much uric acid in your blood. This can happen because: ? Your kidneys do not remove enough uric acid from your blood. ? Your body makes too much uric acid. ? You eat too many foods that are high in purines. These foods include organ meats, some seafood, and beer.  Trauma or stress. What increases the risk?  Having a family history of gout.  Being female and middle-aged.  Being female and having gone through menopause.  Being very overweight (obese).  Drinking alcohol, especially beer.  Not having enough water in the body (being dehydrated).  Losing weight too quickly.  Having an organ transplant.  Having lead poisoning.  Taking certain medicines.  Having kidney disease.  Having a skin condition called psoriasis. What are the signs or symptoms? An attack of acute gout usually happens in just one joint. The most common place is the big toe. Attacks often start at night. Other joints that may be affected include joints of the feet, ankle, knee, fingers, wrist, or elbow. Symptoms of an attack may include:  Very bad pain.  Warmth.  Swelling.  Stiffness.  Shiny, red, or purple skin.  Tenderness. The affected joint may be very painful to touch.  Chills and fever. Chronic gout may cause symptoms more often. More joints may be involved. You may also have white or yellow lumps (tophi) on your hands or feet or in other areas near your joints. How is this  treated?  Treatment for this condition has two phases: treating an acute attack and preventing future attacks.  Acute gout treatment may include: ? NSAIDs. ? Steroids. These are taken by mouth or injected into a joint. ? Colchicine. This medicine relieves pain and swelling. It can be given by mouth or through an IV tube.  Preventive treatment may include: ? Taking small doses of NSAIDs or colchicine daily. ? Using a medicine that reduces uric acid levels in your blood. ? Making changes to your diet. You may need to see a food expert (dietitian) about what to eat and drink to prevent gout. Follow these instructions at home: During a gout attack   If told, put ice on the painful area: ? Put ice in a plastic bag. ? Place a towel between your skin and the bag. ? Leave the ice on for 20 minutes, 2-3 times a day.  Raise (elevate) the painful joint above the level of your heart as often as you can.  Rest the joint as much as possible. If the joint is in your leg, you may be given crutches.  Follow instructions from your doctor about what you cannot eat or drink. Avoiding future gout attacks  Eat a low-purine diet. Avoid foods and drinks such as: ? Liver. ? Kidney. ? Anchovies. ? Asparagus. ? Herring. ? Mushrooms. ? Mussels. ? Beer.  Stay at a healthy weight. If you want to lose weight, talk with your doctor. Do not lose weight   too fast.  Start or continue an exercise plan as told by your doctor. Eating and drinking  Drink enough fluids to keep your pee (urine) pale yellow.  If you drink alcohol: ? Limit how much you use to:  0-1 drink a day for women.  0-2 drinks a day for men. ? Be aware of how much alcohol is in your drink. In the [place] one drink equals one 12 oz bottle of beer (355 mL), one 5 oz glass of wine (148 mL), or one 1 oz glass of hard liquor (44 mL). General instructions  Take over-the-counter and prescription medicines only as told by your doctor.  Do  not drive or use heavy machinery while taking prescription pain medicine.  Return to your normal activities as told by your doctor. Ask your doctor what activities are safe for you.  Keep all follow-up visits as told by your doctor. This is important. Contact a doctor if:  You have another gout attack.  You still have symptoms of a gout attack after 10 days of treatment.  You have problems (side effects) because of your medicines.  You have chills or a fever.  You have burning pain when you pee (urinate).  You have pain in your lower back or belly. Get help right away if:  You have very bad pain.  Your pain cannot be controlled.  You cannot pee. Summary  Gout is painful swelling of the joints.  The most common site of pain is the big toe, but it can affect other joints.  Medicines and avoiding some foods can help to prevent and treat gout attacks. This information is not intended to replace advice given to you by your health care provider. Make sure you discuss any questions you have with your health care provider. Document Revised: 02/13/2018 Document Reviewed: 02/13/2018 Elsevier Patient Education  2020 Elsevier Inc.    Low-Purine Eating Plan A low-purine eating plan involves making food choices to limit your intake of purine. Purine is a kind of uric acid. Too much uric acid in your blood can cause certain conditions, such as gout and kidney stones. Eating a low-purine diet can help control these conditions. What are tips for following this plan? Reading food labels   Avoid foods with saturated or Trans fat.  Check the ingredient list of grains-based foods, such as bread and cereal, to make sure that they contain whole grains.  Check the ingredient list of sauces or soups to make sure they do not contain meat or fish.  When choosing soft drinks, check the ingredient list to make sure they do not contain high-fructose corn syrup. Shopping  Buy plenty of fresh  fruits and vegetables.  Avoid buying canned or fresh fish.  Buy dairy products labeled as low-fat or nonfat.  Avoid buying premade or processed foods. These foods are often high in fat, salt (sodium), and added sugar. Cooking  Use olive oil instead of butter when cooking. Oils like olive oil, canola oil, and sunflower oil contain healthy fats. Meal planning  Learn which foods do or do not affect you. If you find out that a food tends to cause your gout symptoms to flare up, avoid eating that food. You can enjoy foods that do not cause problems. If you have any questions about a food item, talk with your dietitian or health care provider.  Limit foods high in fat, especially saturated fat. Fat makes it harder for your body to get rid of uric acid.    Choose foods that are lower in fat and are lean sources of protein. General guidelines  Limit alcohol intake to no more than 1 drink a day for nonpregnant women and 2 drinks a day for men. One drink equals 12 oz of beer, 5 oz of wine, or 1 oz of hard liquor. Alcohol can affect the way your body gets rid of uric acid.  Drink plenty of water to keep your urine clear or pale yellow. Fluids can help remove uric acid from your body.  If directed by your health care provider, take a vitamin C supplement.  Work with your health care provider and dietitian to develop a plan to achieve or maintain a healthy weight. Losing weight can help reduce uric acid in your blood. What foods are recommended? The items listed may not be a complete list. Talk with your dietitian about what dietary choices are best for you. Foods low in purines Foods low in purines do not need to be limited. These include:  All fruits.  All low-purine vegetables, pickles, and olives.  Breads, pasta, rice, cornbread, and popcorn. Cake and other baked goods.  All dairy foods.  Eggs, nuts, and nut butters.  Spices and condiments, such as salt, herbs, and vinegar.  Plant  oils, butter, and margarine.  Water, sugar-free soft drinks, tea, coffee, and cocoa.  Vegetable-based soups, broths, sauces, and gravies. Foods moderate in purines Foods moderate in purines should be limited to the amounts listed.   cup of asparagus, cauliflower, spinach, mushrooms, or green peas, each day.  2/3 cup uncooked oatmeal, each day.   cup dry wheat bran or wheat germ, each day.  2-3 ounces of meat or poultry, each day.  4-6 ounces of shellfish, such as crab, lobster, oysters, or shrimp, each day.  1 cup cooked beans, peas, or lentils, each day.  Soup, broths, or bouillon made from meat or fish. Limit these foods as much as possible. What foods are not recommended? The items listed may not be a complete list. Talk with your dietitian about what dietary choices are best for you. Limit your intake of foods high in purines, including:  Beer and other alcohol.  Meat-based gravy or sauce.  Canned or fresh fish, such as: ? Anchovies, sardines, herring, and tuna. ? Mussels and scallops. ? Codfish, trout, and haddock.  Bacon.  Organ meats, such as: ? Liver or kidney. ? Tripe. ? Sweetbreads (thymus gland or pancreas).  Wild game or goose.  Yeast or yeast extract supplements.  Drinks sweetened with high-fructose corn syrup. Summary  Eating a low-purine diet can help control conditions caused by too much uric acid in the body, such as gout or kidney stones.  Choose low-purine foods, limit alcohol, and limit foods high in fat.  You will learn over time which foods do or do not affect you. If you find out that a food tends to cause your gout symptoms to flare up, avoid eating that food. This information is not intended to replace advice given to you by your health care provider. Make sure you discuss any questions you have with your health care provider. Document Revised: 07/06/2017 Document Reviewed: 09/06/2016 Elsevier Patient Education  2020 Elsevier  Inc.   

## 2019-11-20 NOTE — Progress Notes (Signed)
Established patient visit     Patient: Toni White   DOB: [birth year]   84 y.o. Female  MRN: [id number] Visit Date: 11/20/2019  Today's healthcare provider: Mar Daring, PA-C  Subjective:    Chief Complaint  Patient presents with  . Foot Pain   HPI Pain  She reports recurrent Right Foot pain, right great toe. was not an injury that may have caused the pain. The pain started about a week ago and is staying constant. The pain does not radiate N/A. The pain is described as aching, soreness and throbbing, is 8/10 in intensity, occurring constantly. Right great toe is red, swollen, an warm. Aggravating factors: sitting, standing and walking Relieving factors: none.  She has tried NSAIDs with mild relief.  Patient does have h/o gout and reports it feels similar. -----------------------------------------------------------------------------------------     Patient Active Problem List   Diagnosis Date Noted  . Varicose veins of both legs with edema 05/02/2017  . Recurrent major depressive disorder, in partial remission ([place]) 06/08/2015  . Hypersomnolence 06/08/2015  . Macular degeneration 06/08/2015  . Sciatica 06/08/2015  . LBP (low back pain) 06/08/2015  . OP (osteoporosis) 06/08/2015  . Bibasilar crackles 06/08/2015  . Edema extremities 06/08/2015  . Tremor 03/17/2015  . 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 Diffuse cystic mastopathy 06/26/2013  . Myalgia and myositis 07/06/2009  . Hypoxemia 07/06/2009  . Hyperlipidemia 05/19/2008  . Prediabetes 12/18/2007  . One eye: total vision impairment; other eye: normal vision 06/29/2007  . Essential (primary) hypertension 08/07/1998  . Chronic obstructive pulmonary disease ([place]) 08/07/1998   Past Medical History:  Diagnosis Date  . COPD (chronic obstructive pulmonary disease) ([place])   . History of chicken pox   . Hyperlipidemia   . Hypertension   . Ulcer        Medications: Outpatient Medications Prior to  Visit  Medication Sig  . albuterol (VENTOLIN HFA) 108 (90 BASE) MCG/ACT inhaler Inhale 2 puffs into the lungs. Every 4-6 hours as needed  . alendronate (FOSAMAX) 70 MG tablet Take 1 tablet (70 mg total) by mouth once a week. Take with a full glass of water on an empty stomach.  Marland Kitchen amLODipine (NORVASC) 2.5 MG tablet TAKE 1 TABLET EVERY DAY  . aspirin 81 MG tablet Take 81 mg by mouth daily.  . Calcium-Magnesium-Vitamin D (CALCIUM 500 PO) Take 1 tablet by mouth 2 (two) times daily.  . citalopram (CELEXA) 20 MG tablet TAKE 1 TABLET EVERY DAY  . Fluticasone-Salmeterol (ADVAIR) 250-50 MCG/DOSE AEPB Inhale 1 puff into the lungs 2 (two) times daily.  . furosemide (LASIX) 20 MG tablet TAKE 1 TABLET TWICE DAILY AS NEEDED  FOR  SWELLING  . gabapentin (NEURONTIN) 100 MG capsule Take 1 capsule (100 mg total) by mouth 2 (two) times daily.  Marland Kitchen levothyroxine (SYNTHROID) 25 MCG tablet TAKE 1 AND 1/2 TABLETS EVERY DAY BEFORE BREAKFAST  . meloxicam (MOBIC) 15 MG tablet TAKE ONE TABLET BY MOUTH EVERY DAY  . metoprolol succinate (TOPROL-XL) 50 MG 24 hr tablet TAKE 1/2 TABLET EVERY DAY  . Multiple Vitamins-Minerals (PRESERVISION AREDS 2+MULTI VIT) CAPS Take 1 capsule by mouth 2 (two) times daily.  . OXYGEN Inhale into the lungs Nightly.  . pantoprazole (PROTONIX) 40 MG tablet TAKE 1 TABLET EVERY DAY  . pravastatin (PRAVACHOL) 40 MG tablet TAKE 1 TABLET (40 MG TOTAL) BY MOUTH AT BEDTIME.  . roflumilast (DALIRESP) 500 MCG TABS tablet Take 500 mcg by mouth daily.  Marland Kitchen tiotropium (  SPIRIVA) 18 MCG inhalation capsule Place 18 mcg into inhaler and inhale daily.  . traMADol (ULTRAM) 50 MG tablet TAKE ONE TABLET EVERY EIGHT HOURS   No facility-administered medications prior to visit.    Review of Systems  Constitutional: Negative.   HENT: Negative.   Eyes: Negative.   Respiratory: Negative.   Cardiovascular: Negative.   Gastrointestinal: Negative.   Endocrine: Negative.   Genitourinary: Negative.   Musculoskeletal:  Positive for arthralgias and joint swelling.  Skin: Positive for color change.  Allergic/Immunologic: Negative.   Neurological: Negative.   Hematological: Negative.   Psychiatric/Behavioral: Negative.     Last CBC Lab Results  Component Value Date   WBC 8.9 12/11/2017   HGB 15.4 12/11/2017   HCT 47.2 (H) 12/11/2017   MCV 92 12/11/2017   MCH 29.8 12/11/2017   RDW 12.8 12/11/2017   PLT 272 [id number]   Last metabolic panel Lab Results  Component Value Date   GLUCOSE 84 04/23/2019   NA 141 04/23/2019   K 5.2 04/23/2019   CL 102 04/23/2019   CO2 27 04/23/2019   BUN 22 04/23/2019   CREATININE 0.85 04/23/2019   GFRNONAA 64 04/23/2019   GFRAA 74 04/23/2019   CALCIUM 10.0 04/23/2019   PHOS 3.9 12/07/2015   PROT 6.7 04/23/2019   ALBUMIN 3.9 04/23/2019   LABGLOB 2.8 04/23/2019   AGRATIO 1.4 04/23/2019   BILITOT 0.4 04/23/2019   ALKPHOS 62 04/23/2019   AST 24 04/23/2019   ALT 20 04/23/2019   ANIONGAP 5 (L) 01/27/2014   Last lipids Lab Results  Component Value Date   CHOL 144 04/23/2019   HDL 41 04/23/2019   LDLCALC 79 04/23/2019   TRIG 139 04/23/2019   CHOLHDL 3.5 04/23/2019        Objective:    There were no vitals taken for this visit. BP Readings from Last 3 Encounters:  11/20/19 131/80  04/23/19 122/84  01/28/18 110/76   Wt Readings from Last 3 Encounters:  11/20/19 169 lb (76.7 kg)  04/23/19 167 lb (75.8 kg)  01/28/18 169 lb 12.8 oz (77 kg)      Physical Exam Vitals reviewed.  Constitutional:      General: She is not in acute distress.    Appearance: Normal appearance. She is well-developed. She is not ill-appearing.  HENT:     Head: Normocephalic and atraumatic.  Pulmonary:     Effort: Pulmonary effort is normal. No respiratory distress.     Comments: Wearing [place] Musculoskeletal:     Cervical back: Normal range of motion and neck supple.     Right foot: Decreased range of motion. Normal capillary refill. Swelling and tenderness present.  Normal pulse.       Legs:  Skin:    General: Skin is warm.  Neurological:     Mental Status: She is alert.  Psychiatric:        Mood and Affect: Mood normal.        Behavior: Behavior normal.        Thought Content: Thought content normal.        Judgment: Judgment normal.      No results found for any visits on 11/20/19.    Assessment & Plan:    1. Acute idiopathic gout involving toe of right foot Suspect gout. This is only 3rd flare in as many years. Will treat with prednisone as below. Hold meloxicam while on prednisone. No need for prophylaxis at this point. Will continue to monitor for flares.  -  predniSONE (DELTASONE) 20 MG tablet; Take 2 tablets (40 mg total) by mouth daily with breakfast.  Dispense: 10 tablet; Refill: 0   No follow-ups on file.     Reynolds Bowl, PA-C, have reviewed all documentation for this visit. The documentation on 11/20/19 for the exam, diagnosis, procedures, and orders are all accurate and complete.    Rubye Beach  [facility] [phone] (phone) [phone] (fax)  [place]

## 2019-11-25 ENCOUNTER — Other Ambulatory Visit: Payer: Self-pay | Admitting: Family Medicine

## 2019-11-25 DIAGNOSIS — M10071 Idiopathic gout, right ankle and foot: Secondary | ICD-10-CM

## 2019-11-25 DIAGNOSIS — Z01810 Encounter for preprocedural cardiovascular examination: Secondary | ICD-10-CM | POA: Diagnosis not present

## 2019-11-25 DIAGNOSIS — G459 Transient cerebral ischemic attack, unspecified: Secondary | ICD-10-CM | POA: Diagnosis not present

## 2019-11-25 MED ORDER — PREDNISONE 10 MG PO TABS: ORAL_TABLET | ORAL | 0 refills | Status: DC

## 2019-11-25 NOTE — Telephone Encounter (Signed)
Pts daughter called stating that the pt is still experiencing gout. She is requesting to have another round of prednisone sent. Please advise.    [address], [address]  [place] [address]  Phone: [phone] Fax: [phone]  Not a 24 hour pharmacy; exact hours not known.

## 2019-11-25 NOTE — Telephone Encounter (Signed)
Medication not delegated to NT to refill-  LRF: 11/20/19 Refill due: - Active medication list: yes Future visit scheduled: no

## 2019-11-26 ENCOUNTER — Other Ambulatory Visit: Payer: Self-pay | Admitting: Family Medicine

## 2019-11-26 NOTE — Telephone Encounter (Signed)
Requested medication (s) are due for refill today: yes  Requested medication (s) are on the active medication list: yes  Last refill:  05/02/19  Future visit scheduled: yes  Notes to clinic:  Not delegated    Requested Prescriptions  Pending Prescriptions Disp Refills   traMADol (ULTRAM) 50 MG tablet [Pharmacy Med Name: TRAMADOL HCL 50 MG TAB] 30 tablet     Sig: TAKE 1 TABLET BY MOUTH EVERY 8 HOURS      Not Delegated - Analgesics:  Opioid Agonists Failed - 11/26/2019  3:54 PM      Failed - This refill cannot be delegated      Failed - Urine Drug Screen completed in last 360 days.      Passed - Valid encounter within last 6 months    Recent Outpatient Visits           6 days ago Acute idiopathic gout involving toe of right foot   [facility] [place], [place]ce Sorrel, [place]   7 months ago Annual physical exam   [facility] Birdie Sons, MD   1 year ago Choking, initial encounter   [place], [place]   1 year ago Laceration of left middle finger without foreign body without damage to nail, initial encounter   [facility] Birdie Sons, MD   1 year ago Contusion of left temporofrontal scalp, initial encounter   [facility] Birdie Sons, MD       Future Appointments             In 1 month [facility], [place]

## 2019-12-01 ENCOUNTER — Emergency Department: Payer: Medicare HMO

## 2019-12-01 ENCOUNTER — Other Ambulatory Visit: Payer: Self-pay

## 2019-12-01 ENCOUNTER — Inpatient Hospital Stay
Admission: EM | Admit: 2019-12-01 | Discharge: 2019-12-06 | DRG: 069 | Disposition: A | Discharge status: Home Health | Payer: Medicare HMO | Attending: Internal Medicine | Admitting: Internal Medicine

## 2019-12-01 DIAGNOSIS — F419 Anxiety disorder, unspecified: Secondary | ICD-10-CM | POA: Diagnosis present

## 2019-12-01 DIAGNOSIS — I071 Rheumatic tricuspid insufficiency: Secondary | ICD-10-CM | POA: Diagnosis present

## 2019-12-01 DIAGNOSIS — I6523 Occlusion and stenosis of bilateral carotid arteries: Secondary | ICD-10-CM | POA: Diagnosis not present

## 2019-12-01 DIAGNOSIS — Z8249 Family history of ischemic heart disease and other diseases of the circulatory system: Secondary | ICD-10-CM

## 2019-12-01 DIAGNOSIS — R471 Dysarthria and anarthria: Secondary | ICD-10-CM | POA: Diagnosis present

## 2019-12-01 DIAGNOSIS — Z8679 Personal history of other diseases of the circulatory system: Secondary | ICD-10-CM

## 2019-12-01 DIAGNOSIS — Z79899 Other long term (current) drug therapy: Secondary | ICD-10-CM

## 2019-12-01 DIAGNOSIS — I10 Essential (primary) hypertension: Secondary | ICD-10-CM | POA: Diagnosis present

## 2019-12-01 DIAGNOSIS — J9610 Chronic respiratory failure, unspecified whether with hypoxia or hypercapnia: Secondary | ICD-10-CM

## 2019-12-01 DIAGNOSIS — J9611 Chronic respiratory failure with hypoxia: Secondary | ICD-10-CM | POA: Diagnosis present

## 2019-12-01 DIAGNOSIS — E1136 Type 2 diabetes mellitus with diabetic cataract: Secondary | ICD-10-CM | POA: Diagnosis present

## 2019-12-01 DIAGNOSIS — E876 Hypokalemia: Secondary | ICD-10-CM | POA: Diagnosis present

## 2019-12-01 DIAGNOSIS — I160 Hypertensive urgency: Secondary | ICD-10-CM | POA: Diagnosis present

## 2019-12-01 DIAGNOSIS — Z79891 Long term (current) use of opiate analgesic: Secondary | ICD-10-CM

## 2019-12-01 DIAGNOSIS — Z66 Do not resuscitate: Secondary | ICD-10-CM | POA: Diagnosis present

## 2019-12-01 DIAGNOSIS — Z888 Allergy status to other drugs, medicaments and biological substances status: Secondary | ICD-10-CM

## 2019-12-01 DIAGNOSIS — Z9981 Dependence on supplemental oxygen: Secondary | ICD-10-CM | POA: Diagnosis not present

## 2019-12-01 DIAGNOSIS — Z7951 Long term (current) use of inhaled steroids: Secondary | ICD-10-CM

## 2019-12-01 DIAGNOSIS — Z90710 Acquired absence of both cervix and uterus: Secondary | ICD-10-CM

## 2019-12-01 DIAGNOSIS — H548 Legal blindness, as defined in USA: Secondary | ICD-10-CM | POA: Diagnosis present

## 2019-12-01 DIAGNOSIS — E039 Hypothyroidism, unspecified: Secondary | ICD-10-CM | POA: Diagnosis present

## 2019-12-01 DIAGNOSIS — Z79890 Hormone replacement therapy (postmenopausal): Secondary | ICD-10-CM

## 2019-12-01 DIAGNOSIS — Z8619 Personal history of other infectious and parasitic diseases: Secondary | ICD-10-CM

## 2019-12-01 DIAGNOSIS — F329 Major depressive disorder, single episode, unspecified: Secondary | ICD-10-CM | POA: Diagnosis present

## 2019-12-01 DIAGNOSIS — R4781 Slurred speech: Secondary | ICD-10-CM | POA: Diagnosis present

## 2019-12-01 DIAGNOSIS — J449 Chronic obstructive pulmonary disease, unspecified: Secondary | ICD-10-CM | POA: Diagnosis present

## 2019-12-01 DIAGNOSIS — L7632 Postprocedural hematoma of skin and subcutaneous tissue following other procedure: Secondary | ICD-10-CM | POA: Diagnosis not present

## 2019-12-01 DIAGNOSIS — I639 Cerebral infarction, unspecified: Secondary | ICD-10-CM | POA: Diagnosis not present

## 2019-12-01 DIAGNOSIS — R441 Visual hallucinations: Secondary | ICD-10-CM | POA: Diagnosis present

## 2019-12-01 DIAGNOSIS — I708 Atherosclerosis of other arteries: Secondary | ICD-10-CM | POA: Diagnosis present

## 2019-12-01 DIAGNOSIS — I6522 Occlusion and stenosis of left carotid artery: Secondary | ICD-10-CM | POA: Diagnosis not present

## 2019-12-01 DIAGNOSIS — G459 Transient cerebral ischemic attack, unspecified: Secondary | ICD-10-CM | POA: Diagnosis present

## 2019-12-01 DIAGNOSIS — R4701 Aphasia: Secondary | ICD-10-CM | POA: Diagnosis present

## 2019-12-01 DIAGNOSIS — Z7982 Long term (current) use of aspirin: Secondary | ICD-10-CM

## 2019-12-01 DIAGNOSIS — R0602 Shortness of breath: Secondary | ICD-10-CM

## 2019-12-01 DIAGNOSIS — Y840 Cardiac catheterization as the cause of abnormal reaction of the patient, or of later complication, without mention of misadventure at the time of the procedure: Secondary | ICD-10-CM | POA: Diagnosis not present

## 2019-12-01 DIAGNOSIS — D72829 Elevated white blood cell count, unspecified: Secondary | ICD-10-CM | POA: Diagnosis present

## 2019-12-01 DIAGNOSIS — Z01810 Encounter for preprocedural cardiovascular examination: Secondary | ICD-10-CM | POA: Diagnosis not present

## 2019-12-01 DIAGNOSIS — Z87891 Personal history of nicotine dependence: Secondary | ICD-10-CM

## 2019-12-01 DIAGNOSIS — Z791 Long term (current) use of non-steroidal anti-inflammatories (NSAID): Secondary | ICD-10-CM

## 2019-12-01 DIAGNOSIS — Z9109 Other allergy status, other than to drugs and biological substances: Secondary | ICD-10-CM

## 2019-12-01 DIAGNOSIS — Y92230 Patient room in hospital as the place of occurrence of the external cause: Secondary | ICD-10-CM | POA: Diagnosis not present

## 2019-12-01 DIAGNOSIS — Z882 Allergy status to sulfonamides status: Secondary | ICD-10-CM

## 2019-12-01 DIAGNOSIS — Z833 Family history of diabetes mellitus: Secondary | ICD-10-CM

## 2019-12-01 DIAGNOSIS — Z20822 Contact with and (suspected) exposure to covid-19: Secondary | ICD-10-CM | POA: Diagnosis present

## 2019-12-01 DIAGNOSIS — K219 Gastro-esophageal reflux disease without esophagitis: Secondary | ICD-10-CM | POA: Diagnosis present

## 2019-12-01 DIAGNOSIS — R519 Headache, unspecified: Secondary | ICD-10-CM | POA: Diagnosis not present

## 2019-12-01 DIAGNOSIS — E785 Hyperlipidemia, unspecified: Secondary | ICD-10-CM | POA: Diagnosis present

## 2019-12-01 LAB — PROTIME-INR
INR: 1 (ref 0.8–1.2)
Prothrombin Time: 12.6 seconds (ref 11.4–15.2)

## 2019-12-01 LAB — DIFFERENTIAL
Abs Immature Granulocytes: 0.17 10*3/uL — ABNORMAL HIGH (ref 0.00–0.07)
Basophils Absolute: 0.1 10*3/uL (ref 0.0–0.1)
Basophils Relative: 0 %
Eosinophils Absolute: 0 10*3/uL (ref 0.0–0.5)
Eosinophils Relative: 0 %
Immature Granulocytes: 1 %
Lymphocytes Relative: 13 %
Lymphs Abs: 2 10*3/uL (ref 0.7–4.0)
Monocytes Absolute: 1.3 10*3/uL — ABNORMAL HIGH (ref 0.1–1.0)
Monocytes Relative: 9 %
Neutro Abs: 11.8 10*3/uL — ABNORMAL HIGH (ref 1.7–7.7)
Neutrophils Relative %: 77 %

## 2019-12-01 LAB — COMPREHENSIVE METABOLIC PANEL
ALT: 35 U/L (ref 0–44)
AST: 27 U/L (ref 15–41)
Albumin: 3.6 g/dL (ref 3.5–5.0)
Alkaline Phosphatase: 55 U/L (ref 38–126)
Anion gap: 12 (ref 5–15)
BUN: 24 mg/dL — ABNORMAL HIGH (ref 8–23)
CO2: 35 mmol/L — ABNORMAL HIGH (ref 22–32)
Calcium: 9.7 mg/dL (ref 8.9–10.3)
Chloride: 96 mmol/L — ABNORMAL LOW (ref 98–111)
Creatinine, Ser: 0.91 mg/dL (ref 0.44–1.00)
GFR calc Af Amer: >60 mL/min (ref >60)
GFR calc non Af Amer: 59 mL/min — ABNORMAL LOW (ref >60)
Glucose, Bld: 100 mg/dL — ABNORMAL HIGH (ref 70–99)
Potassium: 3.6 mmol/L (ref 3.5–5.1)
Sodium: 143 mmol/L (ref 135–145)
Total Bilirubin: 0.7 mg/dL (ref 0.3–1.2)
Total Protein: 7.1 g/dL (ref 6.5–8.1)

## 2019-12-01 LAB — CBC
HCT: 45.2 % (ref 36.0–46.0)
Hemoglobin: 14.9 g/dL (ref 12.0–15.0)
MCH: 29.4 pg (ref 26.0–34.0)
MCHC: 33 g/dL (ref 30.0–36.0)
MCV: 89.2 fL (ref 80.0–100.0)
Platelets: 309 10*3/uL (ref 150–400)
RBC: 5.07 MIL/uL (ref 3.87–5.11)
RDW: 12 % (ref 11.5–15.5)
WBC: 15.3 10*3/uL — ABNORMAL HIGH (ref 4.0–10.5)
nRBC: 0 % (ref 0.0–0.2)

## 2019-12-01 LAB — URINALYSIS, COMPLETE (UACMP) WITH MICROSCOPIC
Bacteria, UA: NONE SEEN
Bilirubin Urine: NEGATIVE
Glucose, UA: NEGATIVE mg/dL
Hgb urine dipstick: NEGATIVE
Ketones, ur: NEGATIVE mg/dL
Nitrite: NEGATIVE
Protein, ur: NEGATIVE mg/dL
Specific Gravity, Urine: 1.018 (ref 1.005–1.030)
Squamous Epithelial / HPF: NONE SEEN (ref 0–5)
pH: 5 (ref 5.0–8.0)

## 2019-12-01 LAB — APTT: aPTT: 30 seconds (ref 24–36)

## 2019-12-01 NOTE — ED Notes (Signed)
Pt states here due to high blood pressure. Pt states back pain as well.

## 2019-12-01 NOTE — ED Provider Notes (Addendum)
[facility] Emergency Department Provider Note  ____________________________________________   First MD Initiated Contact with Patient 12/01/19 2305     (approximate)  I have reviewed the triage vital signs and the nursing notes.   HISTORY  Chief Complaint Aphasia    HPI Toni White is a 84 y.o. female who reportedly has had multiple small strokes in the past and is on an 81 mg aspirin.  She presents by private vehicle with her daughter who is also her power of attorney for evaluation of 2 days of slurred speech that is reportedly getting worse over time.  She also is typically well controlled with blood pressure but her blood pressure has been running high over the last 2 days.  Her only other symptom is that she is seeing numbers on surfaces, including on the dashboard of the car as they were driving and.  She has no history of dementia and is otherwise alert and oriented.  She denies headache, any other visual changes, neck pain, neck stiffness, chest pain, shortness of breath, cough, nausea, vomiting, and abdominal pain and dysuria.  Nothing in particular makes the symptoms better or worse but her slurred speech is getting worse over time.  No new medications although she was taking some medication for her gout a few days ago.  She has chronic respiratory failure and is on 2 L of oxygen, prescribed only at night but has been using it more frequently for comfort.     The symptoms were rather acute in onset 2 days ago and have gradually been worsening and her daughter describes them as at least moderate in severity.  She has not had any word finding difficulties and has had no numbness, tingling, nor weakness in any of her extremities.        Past Medical History:  Diagnosis Date  . COPD (chronic obstructive pulmonary disease) ([place])   . DNR (do not resuscitate) 11/2019   DNR/DNI, confirmed in person by patient and her daughter/POA  . History of chicken pox     . Hyperlipidemia   . Hypertension   . Ulcer     Patient Active Problem List   Diagnosis Date Noted  . CVA (cerebral vascular accident) ([place]) 12/02/2019  . Varicose veins of both legs with edema 05/02/2017  . Recurrent major depressive disorder, in partial remission ([place]) 06/08/2015  . Hypersomnolence 06/08/2015  . Macular degeneration 06/08/2015  . Sciatica 06/08/2015  . LBP (low back pain) 06/08/2015  . OP (osteoporosis) 06/08/2015  . Bibasilar crackles 06/08/2015  . Edema extremities 06/08/2015  . Tremor 03/17/2015  . 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 Diffuse cystic mastopathy 06/26/2013  . Myalgia and myositis 07/06/2009  . Hypoxemia 07/06/2009  . Hyperlipidemia 05/19/2008  . Prediabetes 12/18/2007  . One eye: total vision impairment; other eye: normal vision 06/29/2007  . Essential (primary) hypertension 08/07/1998  . Chronic obstructive pulmonary disease ([place]) 08/07/1998    Past Surgical History:  Procedure Laterality Date  . ABDOMINAL HYSTERECTOMY  2003   with BSO due to bleeding  . APPENDECTOMY    . BREAST BIOPSY Left 2005   stereo breast biopsy benign  . BREAST BIOPSY Right 2005   core bx. benign  . BREAST BIOPSY Left 2000?   benign  . BREAST CYST EXCISION Left 1990   incision and draniage of abscess  . CHOLECYSTECTOMY    . COLONOSCOPY  2003   Dr. Vira Agar. Hyperplastic polyps; Single polyp excision from rectum  .  CT Scan of Brain  08/20/2013   Mild diffuse cortical atrophy. Mid chronic ischemic white matter disease. 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 [place]  . INCISION AND DRAINAGE BREAST ABSCESS Left 1990  . TONSILLECTOMY  1952  . UPPER GI ENDOSCOPY  02/26/2002   Dr. Tiffany Kocher; Gastritis. Single gastric ectasia    Prior to Admission medications   Medication Sig Start Date End Date Taking? Authorizing Provider  traMADol (ULTRAM) 50 MG tablet TAKE 1 TABLET BY MOUTH EVERY 8 HOURS 11/27/19   Birdie Sons, MD  albuterol (VENTOLIN HFA) 108 (90 BASE) MCG/ACT inhaler Inhale 2 puffs into the lungs. Every 4-6 hours as needed 08/31/11   [provider]  alendronate (FOSAMAX) 70 MG tablet Take 1 tablet (70 mg total) by mouth once a week. Take with a full glass of water on an empty stomach. 12/10/18   Birdie Sons, MD  amLODipine (NORVASC) 2.5 MG tablet TAKE 1 TABLET EVERY DAY 10/27/19   Birdie Sons, MD  aspirin 81 MG tablet Take 81 mg by mouth daily.    [provider]  Calcium-Magnesium-Vitamin D (CALCIUM 500 PO) Take 1 tablet by mouth 2 (two) times daily. 06/14/07   [provider]  citalopram (CELEXA) 20 MG tablet TAKE 1 TABLET EVERY DAY 02/22/16   [provider]  Fluticasone-Salmeterol (ADVAIR) 250-50 MCG/DOSE AEPB Inhale 1 puff into the lungs 2 (two) times daily.    [provider]  furosemide (LASIX) 20 MG tablet TAKE 1 TABLET TWICE DAILY AS NEEDED  FOR  SWELLING 06/24/19   Birdie Sons, MD  gabapentin (NEURONTIN) 100 MG capsule Take 1 capsule (100 mg total) by mouth 2 (two) times daily. 03/24/19   Birdie Sons, MD  levothyroxine (SYNTHROID) 25 MCG tablet TAKE 1 AND 1/2 TABLETS EVERY DAY BEFORE BREAKFAST 06/06/19   Birdie Sons, MD  meloxicam (MOBIC) 15 MG tablet TAKE ONE TABLET BY MOUTH EVERY DAY 12/18/18   Birdie Sons, MD  metoprolol succinate (TOPROL-XL) 50 MG 24 hr tablet TAKE 1/2 TABLET EVERY DAY 02/27/19   Birdie Sons, MD  Multiple Vitamins-Minerals (PRESERVISION AREDS 2+MULTI VIT) CAPS Take 1 capsule by mouth 2 (two) times daily.    [provider]  OXYGEN Inhale into the lungs Nightly.    [provider]  pantoprazole (PROTONIX) 40 MG tablet TAKE 1 TABLET EVERY DAY 07/24/19   Birdie Sons, MD  pravastatin (PRAVACHOL) 40 MG tablet TAKE 1 TABLET (40 MG TOTAL) BY MOUTH AT BEDTIME. 12/31/18   Birdie Sons, MD  predniSONE (DELTASONE) 10 MG tablet Take 3 tablets for 3 days, then 2 tablets for 3  days, then 1 tablet for 3 days, then stop 11/25/19   Birdie Sons, MD  roflumilast (DALIRESP) 500 MCG TABS tablet Take 500 mcg by mouth daily.    [provider]  tiotropium (SPIRIVA) 18 MCG inhalation capsule Place 18 mcg into inhaler and inhale daily.    [provider]    Allergies Flexeril  [cyclobenzaprine hcl], Reglan [metoclopramide], Relafen [nabumetone], Risperdal  [risperidone], Sulfa antibiotics, Tape, Triamterene-hctz, and Hctz [hydrochlorothiazide]  Family History  Problem Relation Age of Onset  . Heart disease Father   . Congestive Heart Failure Father   . Heart disease Mother   . Congestive Heart Failure Mother   . Cancer Brother        lung  . Lung cancer Brother   . Diabetes  Daughter   . Diabetes Daughter   . Breast cancer Neg Hx     Social History Social History   Tobacco Use  . Smoking status: Former Smoker    Packs/day: 1.00    Years: 30.00    Pack years: 30.00    Types: Cigarettes  . Smokeless tobacco: Never Used  . Tobacco comment: quit around 1989  Substance Use Topics  . Alcohol use: No  . Drug use: No    Review of Systems Constitutional: No fever/chills Eyes: No visual changes. ENT: No sore throat. Cardiovascular: Denies chest pain. Respiratory: Denies shortness of breath. Gastrointestinal: No abdominal pain.  No nausea, no vomiting.  No diarrhea.  No constipation. Genitourinary: Negative for dysuria. Musculoskeletal: Negative for neck pain.  Negative for back pain. Integumentary: Negative for rash. Neurological: Slurred speech x2 days.  Gradually worsening high blood pressure.  Negative for headaches, focal weakness or numbness.   ____________________________________________   PHYSICAL EXAM:  VITAL SIGNS: ED Triage Vitals  Enc Vitals Group     BP 12/01/19 1941 (!) 142/83     Pulse Rate 12/01/19 1941 72     Resp 12/01/19 1941 17     Temp 12/01/19 1941 98.4 F (36.9 C)     Temp Source 12/01/19 1941 Oral      SpO2 12/01/19 1941 100 %     Weight 12/01/19 1936 76.7 kg (169 lb)     Height 12/01/19 1936 1.549 m (5\' 1" )     Head Circumference --      Peak Flow --      Pain Score 12/01/19 1936 0     Pain Loc --      Pain Edu? --      Excl. in [place]? --     Constitutional: Alert and oriented.  Patient is appropriately interactive. Eyes: Conjunctivae are normal.  Pupils are equal and reactive.  Extraocular motion is intact. Head: Atraumatic. Nose: No congestion/rhinnorhea. Mouth/Throat: Patient is wearing a mask. Neck: No stridor.  No meningeal signs.   Cardiovascular: Normal rate, regular rhythm. Good peripheral circulation. Grossly normal heart sounds. Respiratory: Normal respiratory effort.  No retractions. Gastrointestinal: Soft and nontender. No distention.  Musculoskeletal: No lower extremity tenderness nor edema. No gross deformities of extremities. Neurologic: Slurred speech consistent with acute dysarthria but no word finding difficulties and no difficulty understanding anything that I am saying (no evidence of expressive or receptive aphasia).  Good grip strength bilaterally, normal and equal bilateral arm and leg strength.  No obvious facial droop or other cranial nerve deficits. Skin:  Skin is warm, dry and intact. Psychiatric: Mood and affect are normal. Speech and behavior are normal.  NIH Stroke Scale  Interval: Baseline Time: 12:00 AM Person Administering Scale: Hinda Kehr  Administer stroke scale items in the order listed. Record performance in each category after each subscale exam. Do not go back and change scores. Follow directions provided for each exam technique. Scores should reflect what the patient does, not what the clinician thinks the patient can do. The clinician should record answers while administering the exam and work quickly. Except where indicated, the patient should not be coached (i.e., repeated requests to patient to make a special effort).   1a  Level of  consciousness: 0=alert; keenly responsive  1b. LOC questions:  0=Performs both tasks correctly  1c. LOC commands: 0=Performs both tasks correctly  2.  Best Gaze: 0=normal  3.  Visual: 0=No visual loss  4. Facial Palsy: 0=Normal symmetric movement  5a.  Motor left arm: 0=No drift, limb holds 90 (or 45) degrees for full 10 seconds  5b.  Motor right arm: 0=No drift, limb holds 90 (or 45) degrees for full 10 seconds  6a. motor left leg: 0=No drift, limb holds 90 (or 45) degrees for full 10 seconds  6b  Motor right leg:  0=No drift, limb holds 90 (or 45) degrees for full 10 seconds  7. Limb Ataxia: 0=Absent  8.  Sensory: 0=Normal; no sensory loss  9. Best Language:  0=No aphasia, normal  10. Dysarthria: 1=Mild to moderate, patient slurs at least some words and at worst, can be understood with some difficulty  11. Extinction and Inattention: 0=No abnormality  12. Distal motor function: 0=Normal   Total:   1   The patient is not a candidate for TPA given the duration of her symptoms and the mild symptoms with an NIH stroke scale of 1.  ____________________________________________   LABS (all labs ordered are listed, but only abnormal results are displayed)  Labs Reviewed  CBC - Abnormal; Notable for the following components:      Result Value   WBC 15.3 (*)    All other components within normal limits  DIFFERENTIAL - Abnormal; Notable for the following components:   Neutro Abs 11.8 (*)    Monocytes Absolute 1.3 (*)    Abs Immature Granulocytes 0.17 (*)    All other components within normal limits  COMPREHENSIVE METABOLIC PANEL - Abnormal; Notable for the following components:   Chloride 96 (*)    CO2 35 (*)    Glucose, Bld 100 (*)    BUN 24 (*)    GFR calc non Af Amer 59 (*)    All other components within normal limits  URINALYSIS, COMPLETE (UACMP) WITH MICROSCOPIC - Abnormal; Notable for the following components:   Color, Urine YELLOW (*)    APPearance CLEAR (*)     Leukocytes,Ua TRACE (*)    All other components within normal limits  RESPIRATORY PANEL BY RT PCR (FLU A&B, COVID)  PROTIME-INR  APTT   ____________________________________________  EKG  ED ECG REPORT I, Hinda Kehr, the attending physician, personally viewed and interpreted this ECG.  Date: 12/01/2019 EKG Time: 19:45 Rate: 69 Rhythm: normal sinus rhythm QRS Axis: normal Intervals: RBBB ST/T Wave abnormalities: normal Narrative Interpretation: no evidence of acute ischemia     ____________________________________________  RADIOLOGY I, Hinda Kehr, personally viewed and evaluated these images (plain radiographs) as part of my medical decision making, as well as reviewing the written report by the radiologist.  ED MD interpretation: Generalized atrophy with no acute abnormalities identified.  Stable partially calcified aneurysmal dilatation at the tip of the basilar artery.  Small chronic lacunar infarcts.  Official radiology report(s): CT HEAD WO CONTRAST  Result Date: 12/01/2019 CLINICAL DATA:  Slurred speech x2 days. EXAM: CT HEAD WITHOUT CONTRAST TECHNIQUE: Contiguous axial images were obtained from the base of the skull through the vertex without intravenous contrast. COMPARISON:  Dec 19, 2017 FINDINGS: Brain: There is mild cerebral atrophy with widening of the extra-axial spaces and ventricular dilatation. There are areas of decreased attenuation within the white matter tracts of the supratentorial brain, consistent with microvascular disease changes. Small chronic bilateral basal ganglia lacunar infarcts are seen. 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 Skull: Normal. Negative for fracture or focal lesion. Sinuses/Orbits: No acute finding. Other: None. IMPRESSION: 1. Generalized cerebral atrophy. 2. No  acute intracranial abnormality. 3. Stable, approximately 5.3 mm diameter partially  calcified, aneurysmal dilatation of the tip of the basilar artery. 4. Small chronic bilateral basal ganglia lacunar infarcts. Electronically Signed   By: Virgina Norfolk M.D.   On: 12/01/2019 20:16    ____________________________________________   PROCEDURES   Procedure(s) performed (including Critical Care):  .1-3 Lead EKG Interpretation Performed by: Hinda Kehr, MD Authorized by: Hinda Kehr, MD     Interpretation: normal     ECG rate:  75   ECG rate assessment: normal     Rhythm: sinus rhythm     Ectopy: none     Conduction: normal       ____________________________________________   INITIAL IMPRESSION / MDM / ASSESSMENT AND PLAN / ED COURSE  As part of my medical decision making, I reviewed the following data within the electronic MEDICAL RECORD NUMBER History obtained from family, Nursing notes reviewed and incorporated, Labs reviewed , EKG interpreted , Old chart reviewed, Discussed with admitting physician  and reviewed Notes from prior ED visits   Differential diagnosis includes, but is not limited to, CVA, worsening of symptoms from old stroke, dementia, acute infection, electrolyte or metabolic abnormality, malignant hypertension, acute intracranial bleed.  The patient is having no pain.  Her aphasia seems to be her only neurological symptom but it has been gradually worsening over the last 2 days according to both the patient and her daughter.  I believe that her hypertension is likely due to a CVA.  I will allow it to continue untreated at this time (permissive hypertension) given the concern for treating the number aggressively may result in additional cerebral ischemia.  The patient and her daughter understand and agree with this plan.  She passed the ED stroke swallow screen and I am giving a full dose aspirin.  Vital signs are stable other than a hypertension.  Her lab work is generally reassuring.  She has no sign of urinary tract infection and her comprehensive  metabolic panel is within normal limits.  The patient is on the cardiac monitor to evaluate for evidence of arrhythmia and/or significant heart rate changes.     Clinical Course as of Dec 01 21  Tue Dec 02, 2019  0014 Discussed case with Dr. Sidney Ace with the hospitalist team.  He will admit the patient for CVA workup.   [CF]  0014 I verified with the daughter who is at bedside that the patient has the [facility] DNR paperwork at home.  The daughter will bring it to the hospital when she has the opportunity.  The patient also indicated that DNR is her wish.   [CF]    Clinical Course User Index [CF] Hinda Kehr, MD     ____________________________________________  FINAL CLINICAL IMPRESSION(S) / ED DIAGNOSES  Final diagnoses:  Cerebrovascular accident (CVA), unspecified mechanism ([place])  Dysarthria  Hypertension, unspecified type  Chronic respiratory failure with hypoxia ([place])  DNR (do not resuscitate)     MEDICATIONS GIVEN DURING THIS VISIT:  Medications  aspirin chewable tablet 324 mg (has no administration in time range)  labetalol (NORMODYNE) injection 20 mg (has no administration in time range)     ED Discharge Orders    None      *Please note:  DAFNE TORRE was evaluated in Emergency Department on 12/02/2019 for the symptoms described in the history of present illness. She was evaluated in the context of the global COVID-19 pandemic, which necessitated consideration that the patient might be at risk for  infection with the SARS-CoV-2 virus that causes COVID-19. Institutional protocols and algorithms that pertain to the evaluation of patients at risk for COVID-19 are in a state of rapid change based on information released by regulatory bodies including the CDC and federal and state organizations. These policies and algorithms were followed during the patient's care in the ED.  Some ED evaluations and interventions may be delayed as a result of limited staffing during the  pandemic.*  Note:  This document was prepared using Dragon voice recognition software and may include unintentional dictation errors.   Hinda Kehr, MD 12/02/19 [place]    Hinda Kehr, MD 12/02/19 [id number]    Hinda Kehr, MD 12/02/19 [phone]

## 2019-12-01 NOTE — ED Triage Notes (Signed)
Pt arrives to ED via [facility] from home with c/o slurred speech x2 days and "seeing numbers on the wall and everything". No facial droop noted, smile and face is symmetrical, MAEW; bilateral grips strong and equal. Pt denies any unilateral extremity numbness or weakness. Pt reports the visual disturbances are intermittent "and I really can't understand to explain it". Pt is alert, in NAD; RR even, regular, and unlabored.

## 2019-12-02 ENCOUNTER — Observation Stay: Payer: Medicare HMO

## 2019-12-02 ENCOUNTER — Observation Stay
Admit: 2019-12-02 | Discharge: 2019-12-02 | Disposition: A | Discharge status: Home / Self Care | Payer: Medicare HMO | Attending: Family Medicine | Admitting: Family Medicine

## 2019-12-02 ENCOUNTER — Encounter: Payer: Self-pay | Admitting: Family Medicine

## 2019-12-02 ENCOUNTER — Emergency Department: Payer: Medicare HMO

## 2019-12-02 ENCOUNTER — Other Ambulatory Visit: Payer: Self-pay

## 2019-12-02 DIAGNOSIS — Y840 Cardiac catheterization as the cause of abnormal reaction of the patient, or of later complication, without mention of misadventure at the time of the procedure: Secondary | ICD-10-CM | POA: Diagnosis not present

## 2019-12-02 DIAGNOSIS — J9610 Chronic respiratory failure, unspecified whether with hypoxia or hypercapnia: Secondary | ICD-10-CM

## 2019-12-02 DIAGNOSIS — F419 Anxiety disorder, unspecified: Secondary | ICD-10-CM | POA: Diagnosis present

## 2019-12-02 DIAGNOSIS — I708 Atherosclerosis of other arteries: Secondary | ICD-10-CM | POA: Diagnosis present

## 2019-12-02 DIAGNOSIS — R4701 Aphasia: Secondary | ICD-10-CM | POA: Diagnosis present

## 2019-12-02 DIAGNOSIS — G459 Transient cerebral ischemic attack, unspecified: Secondary | ICD-10-CM

## 2019-12-02 DIAGNOSIS — J449 Chronic obstructive pulmonary disease, unspecified: Secondary | ICD-10-CM

## 2019-12-02 DIAGNOSIS — R4781 Slurred speech: Secondary | ICD-10-CM | POA: Diagnosis not present

## 2019-12-02 DIAGNOSIS — R471 Dysarthria and anarthria: Secondary | ICD-10-CM | POA: Diagnosis present

## 2019-12-02 DIAGNOSIS — H548 Legal blindness, as defined in USA: Secondary | ICD-10-CM | POA: Diagnosis present

## 2019-12-02 DIAGNOSIS — I160 Hypertensive urgency: Secondary | ICD-10-CM

## 2019-12-02 DIAGNOSIS — K219 Gastro-esophageal reflux disease without esophagitis: Secondary | ICD-10-CM | POA: Diagnosis present

## 2019-12-02 DIAGNOSIS — I639 Cerebral infarction, unspecified: Secondary | ICD-10-CM | POA: Diagnosis not present

## 2019-12-02 DIAGNOSIS — I6522 Occlusion and stenosis of left carotid artery: Secondary | ICD-10-CM | POA: Diagnosis not present

## 2019-12-02 DIAGNOSIS — L7632 Postprocedural hematoma of skin and subcutaneous tissue following other procedure: Secondary | ICD-10-CM | POA: Diagnosis not present

## 2019-12-02 DIAGNOSIS — D72829 Elevated white blood cell count, unspecified: Secondary | ICD-10-CM | POA: Diagnosis present

## 2019-12-02 DIAGNOSIS — E039 Hypothyroidism, unspecified: Secondary | ICD-10-CM | POA: Diagnosis present

## 2019-12-02 DIAGNOSIS — I6523 Occlusion and stenosis of bilateral carotid arteries: Secondary | ICD-10-CM | POA: Diagnosis not present

## 2019-12-02 DIAGNOSIS — J9611 Chronic respiratory failure with hypoxia: Secondary | ICD-10-CM

## 2019-12-02 DIAGNOSIS — Z9981 Dependence on supplemental oxygen: Secondary | ICD-10-CM | POA: Diagnosis not present

## 2019-12-02 DIAGNOSIS — I071 Rheumatic tricuspid insufficiency: Secondary | ICD-10-CM | POA: Diagnosis present

## 2019-12-02 DIAGNOSIS — E1136 Type 2 diabetes mellitus with diabetic cataract: Secondary | ICD-10-CM | POA: Diagnosis present

## 2019-12-02 DIAGNOSIS — E876 Hypokalemia: Secondary | ICD-10-CM | POA: Diagnosis present

## 2019-12-02 DIAGNOSIS — I10 Essential (primary) hypertension: Secondary | ICD-10-CM | POA: Diagnosis present

## 2019-12-02 DIAGNOSIS — Y92230 Patient room in hospital as the place of occurrence of the external cause: Secondary | ICD-10-CM | POA: Diagnosis not present

## 2019-12-02 DIAGNOSIS — R441 Visual hallucinations: Secondary | ICD-10-CM | POA: Diagnosis present

## 2019-12-02 DIAGNOSIS — F329 Major depressive disorder, single episode, unspecified: Secondary | ICD-10-CM | POA: Diagnosis present

## 2019-12-02 DIAGNOSIS — R519 Headache, unspecified: Secondary | ICD-10-CM | POA: Diagnosis not present

## 2019-12-02 DIAGNOSIS — Z66 Do not resuscitate: Secondary | ICD-10-CM | POA: Diagnosis present

## 2019-12-02 DIAGNOSIS — R0602 Shortness of breath: Secondary | ICD-10-CM | POA: Diagnosis not present

## 2019-12-02 DIAGNOSIS — Z20822 Contact with and (suspected) exposure to covid-19: Secondary | ICD-10-CM | POA: Diagnosis present

## 2019-12-02 DIAGNOSIS — E785 Hyperlipidemia, unspecified: Secondary | ICD-10-CM | POA: Diagnosis present

## 2019-12-02 LAB — LIPID PANEL
Cholesterol: 179 mg/dL (ref 0–200)
HDL: 54 mg/dL (ref >40)
LDL Cholesterol: 99 mg/dL (ref 0–99)
Total CHOL/HDL Ratio: 3.3 RATIO
Triglycerides: 130 mg/dL (ref <150)
VLDL: 26 mg/dL (ref 0–40)

## 2019-12-02 LAB — HEMOGLOBIN A1C
Hgb A1c MFr Bld: 6 % — ABNORMAL HIGH (ref 4.8–5.6)
Mean Plasma Glucose: 125.5 mg/dL

## 2019-12-02 LAB — FOLATE: Folate: 35 ng/mL (ref >5.9)

## 2019-12-02 LAB — RESPIRATORY PANEL BY RT PCR (FLU A&B, COVID)
Influenza A by PCR: NEGATIVE
Influenza B by PCR: NEGATIVE
SARS Coronavirus 2 by RT PCR: NEGATIVE

## 2019-12-02 LAB — TSH: TSH: 3.061 u[IU]/mL (ref 0.350–4.500)

## 2019-12-02 LAB — VITAMIN B12: Vitamin B-12: 331 pg/mL (ref 180–914)

## 2019-12-02 LAB — SEDIMENTATION RATE: Sed Rate: 10 mm/hr (ref 0–30)

## 2019-12-02 IMAGING — MR MR HEAD W/O CM
12 of 14 series · 39 of 48 positions shown · non-contrast
Comparison: Noncontrast head CT [DATE], noncontrast head CT
[DATE], brain MRI [DATE], CT angiogram head [DATE]

CLINICAL DATA: Neuro deficit, acute, stroke suspected. Additional
history provided: Acute onset slurred speech, headache, mild nausea
without vomiting

EXAM:
MRI HEAD WITHOUT CONTRAST
TECHNIQUE: Multiplanar, multiecho pulse sequences of the brain and surrounding
structures were obtained without intravenous contrast.

[Series 5: ax dwi_tracew · axial · 3.0mm · 0.60mm/px · z∈[-59,+95]mm · 5 of 96 slices shown]
[im 1/96]
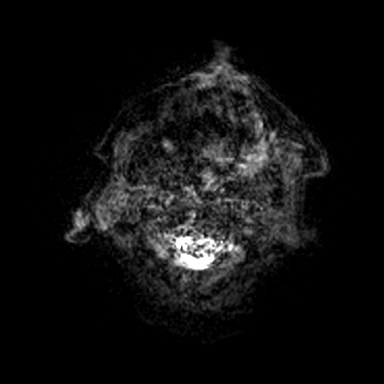
[im 24/96]
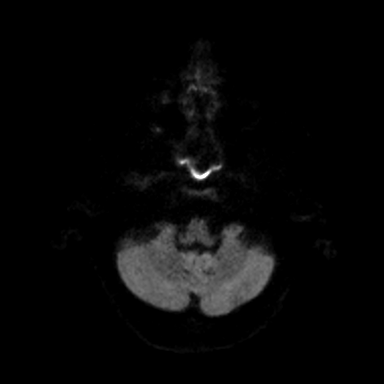
[im 48/96]
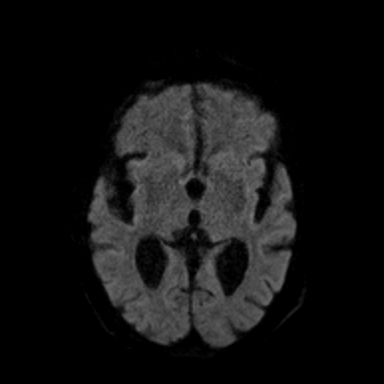
[im 72/96]
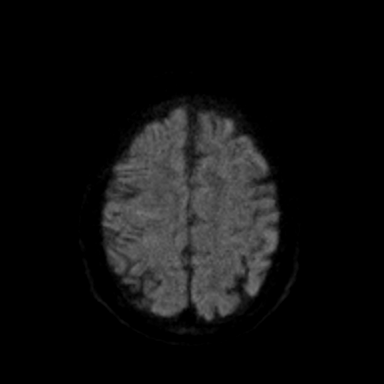
[im 96/96]
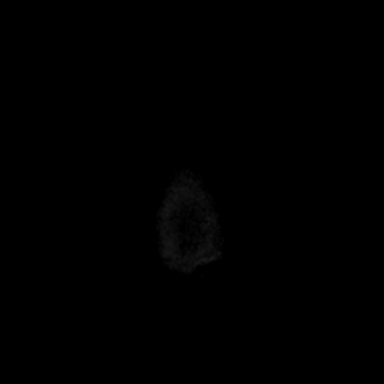

[Series 6: ax dwi_adc · axial · 3.0mm · 0.60mm/px · z∈[-59,+95]mm · 2 of 48 slices shown]
[im 1/48]
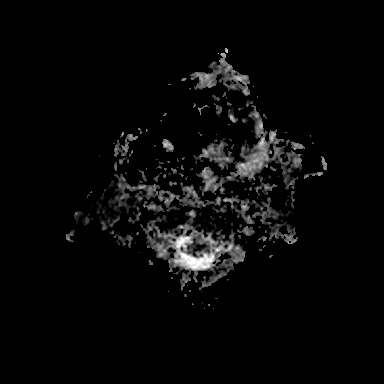
[im 48/48]
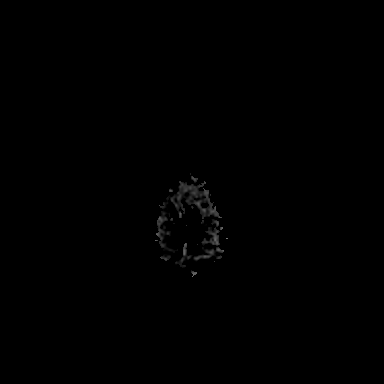

[Series 7: cor dwi_tracew · coronal · 5.0mm · 0.60mm/px · 5 of 80 slices shown]
[im 1/80]
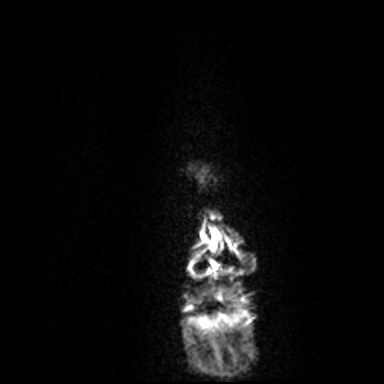
[im 20/80]
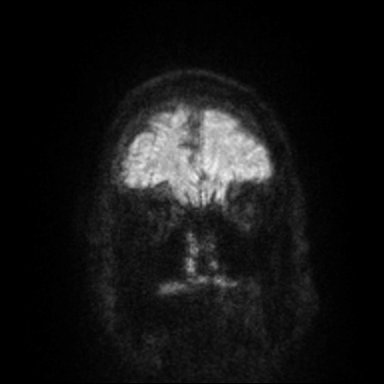
[im 40/80]
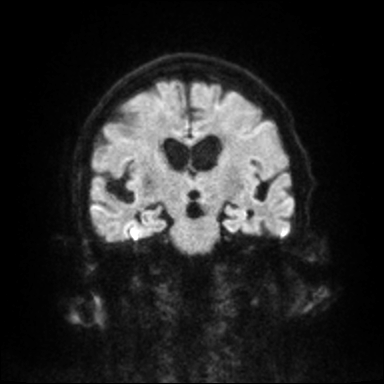
[im 60/80]
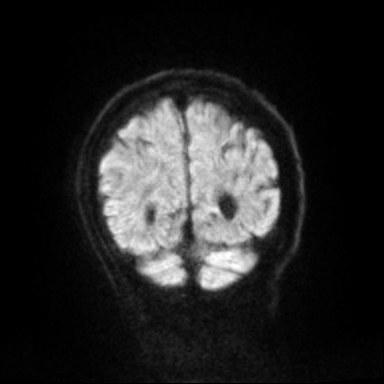
[im 80/80]
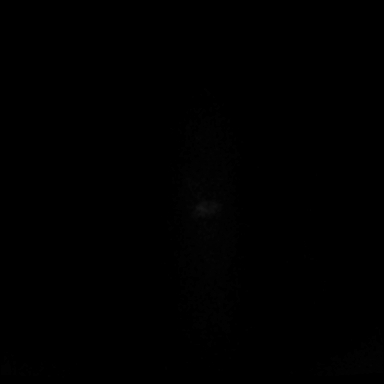

[Series 8: cor dwi_adc · coronal · 5.0mm · 0.60mm/px · 2 of 38 slices shown]
[im 1/38]
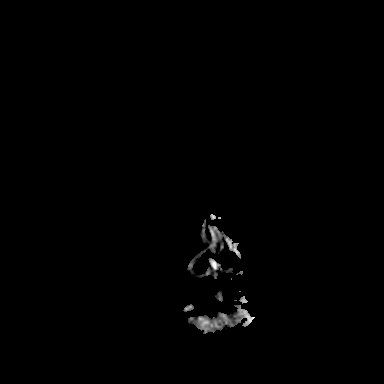
[im 38/38]
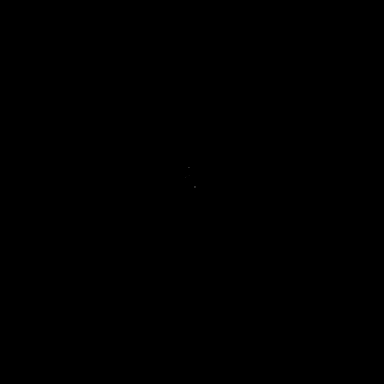

[Series 9: T1 · sagittal · 5.0mm · 0.62mm/px · 1 of 23 slices shown (1 of 2)]
[im 1/23]
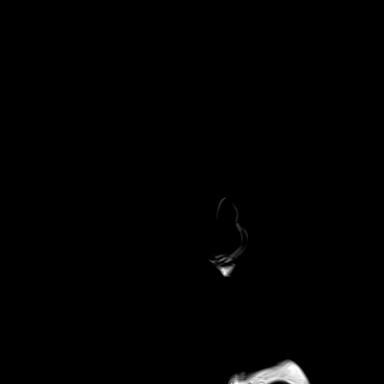

[Series 10: T2 · axial · 5.0mm · 0.53mm/px · z∈[-56,+87]mm · 2 of 25 slices shown (1 of 3)]
[im 1/25]
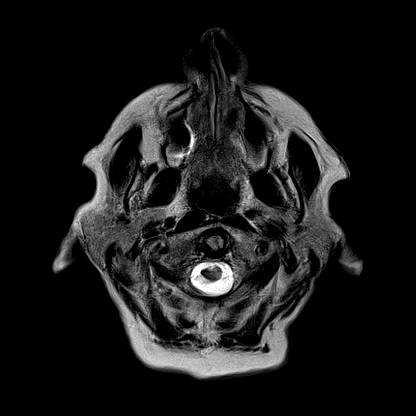
[im 25/25]
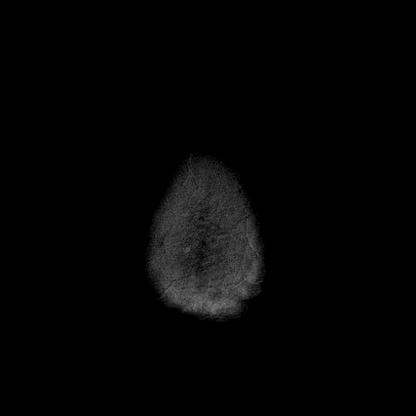

[Series 12: pha_images · axial · 3.0mm · 0.90mm/px · z∈[-72,+100]mm · 4 of 58 slices shown]
[im 1/58]
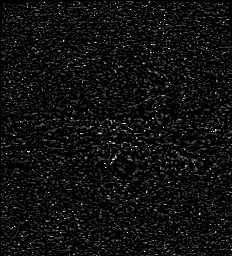
[im 20/58]
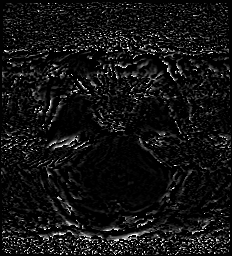
[im 39/58]
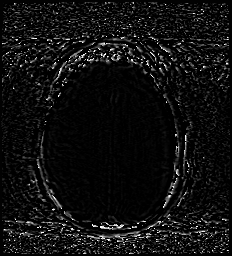
[im 58/58]
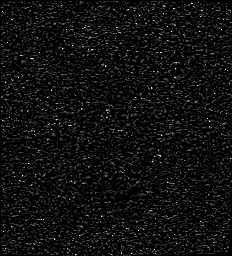

[Series 15: FLAIR · axial · 3.0mm · 0.53mm/px · z∈[-65,+96]mm · 3 of 55 slices shown (1 of 2)]
[im 1/55]
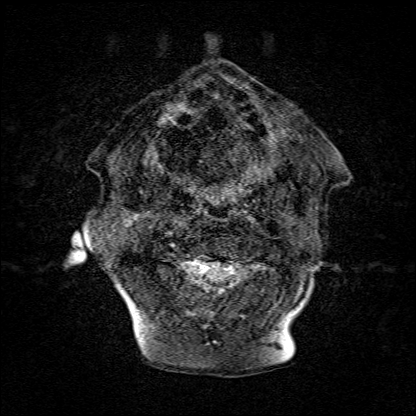
[im 28/55]
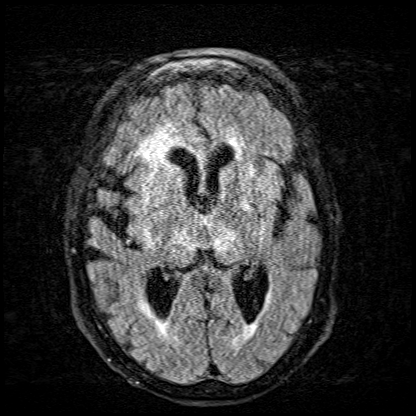
[im 55/55]
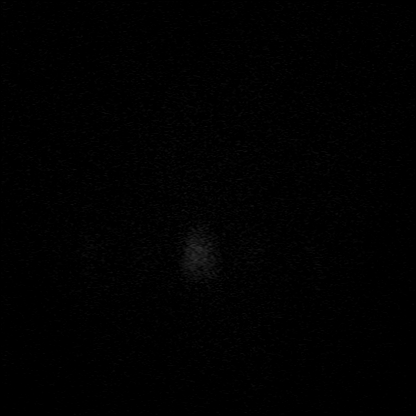

[Series 16: T1 · axial · 1.0mm · 0.98mm/px · z∈[-76,+98]mm · 8 of 176 slices shown (2 of 2)]
[im 1/176]
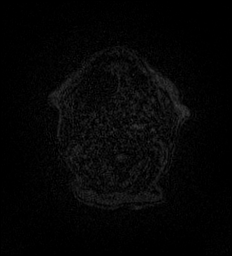
[im 36/176]
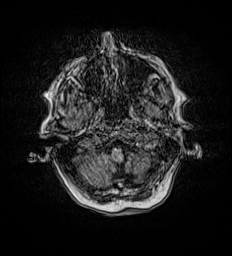
[im 53/176]
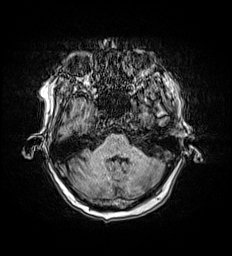
[im 71/176]
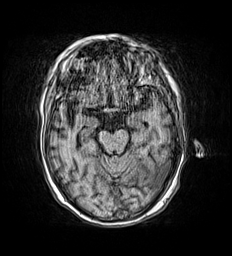
[im 106/176]
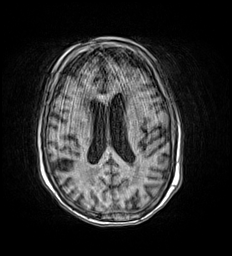
[im 123/176]
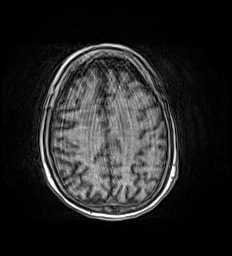
[im 141/176]
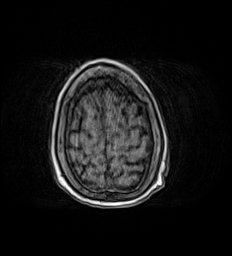
[im 176/176]
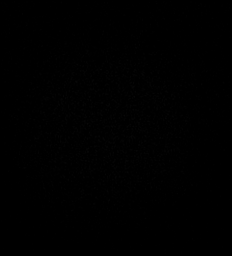

[Series 17: T2 · coronal · 5.0mm · 0.57mm/px · 2 of 29 slices shown (2 of 3)]
[im 1/29]
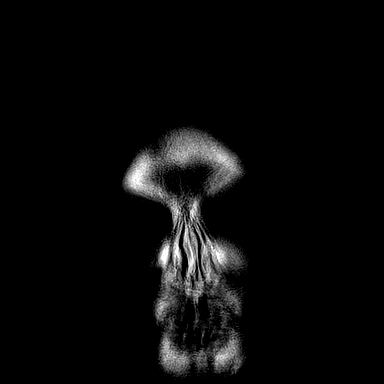
[im 29/29]
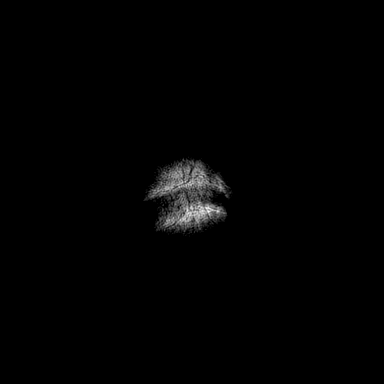

[Series 18: FLAIR · axial · 3.0mm · 1.20mm/px · z∈[-64,+96]mm · 3 of 54 slices shown (2 of 2)]
[im 1/54]
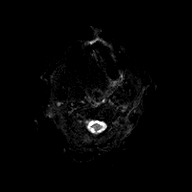
[im 27/54]
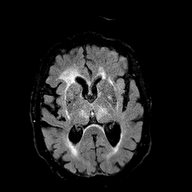
[im 54/54]
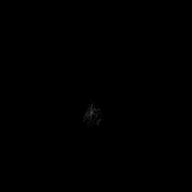

[Series 19: T2 · axial · 5.0mm · 0.45mm/px · z∈[-56,+87]mm · 2 of 25 slices shown (3 of 3)]
[im 1/25]
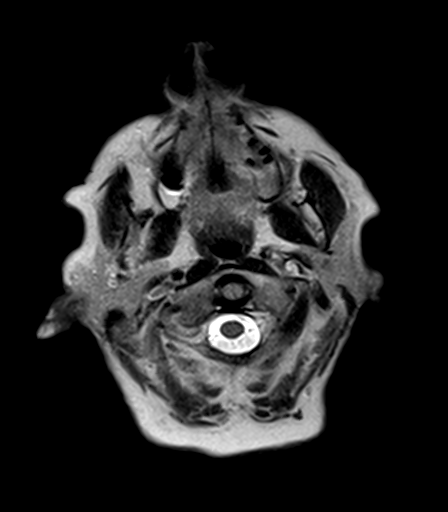
[im 25/25]
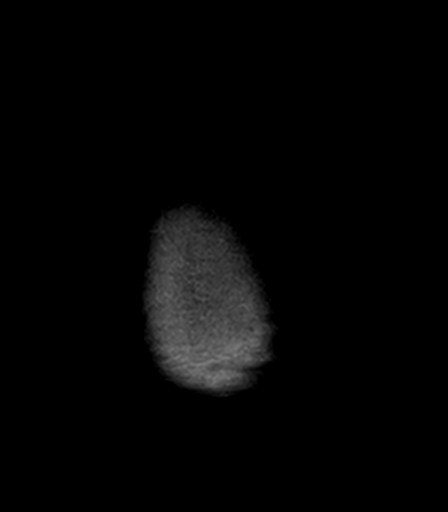

[39 of 48 positions shown; findings below may reference images not displayed]

FINDINGS: Brain:

Multiple sequences are significantly motion degraded. Most notably
there is moderate motion degradation of the axial and coronal
diffusion-weighted sequence, moderate motion degradation of the
axial SWI sequence, moderate motion degradation of the axial T1
weighted sequence, moderate/severe motion degradation of the axial
T2 FLAIR sequence and moderate/severe motion degradation of the
coronal T2 weighted sequence.

Moderate to advanced patchy and confluent T2/FLAIR hyperintensity
within the cerebral white matter is nonspecific, but consistent with
chronic small vessel ischemic disease. Findings have progressed as
compared to prior MRI [DATE]. Redemonstrated chronic lacunar
infarcts within the bilateral basal ganglia. Also again seen, there
are chronic small-vessel ischemic changes within the thalami and
pons.

Chronic microhemorrhages within the right frontal lobe, left
periatrial region and left cerebellum not definitively present on a
prior MRI.

Stable, mild generalized parenchymal atrophy.

There is no acute infarct.

No evidence of intracranial mass.

No extra-axial fluid collection.

No midline shift.

Vascular: Expected proximal arterial flow voids. A known 6 x 9 mm
basilar tip aneurysm is incompletely assessed on the current
examination but appears grossly unchanged.

Skull and upper cervical spine: No focal marrow lesion

Sinuses/Orbits: Visualized orbits show no acute finding. Mild
ethmoid and right maxillary sinus mucosal thickening. No significant
mastoid effusion.
IMPRESSION: 1. Motion degraded examination as described.
2. No evidence of acute intracranial abnormality, including acute
infarction.
3. Moderate to advanced chronic small vessel ischemic disease has
progressed as compared to prior MRI [DATE]. Redemonstrated
chronic lacunar infarcts within the bilateral basal ganglia.
4. Nonspecific chronic microhemorrhages within the right frontal
lobe, left periatrial region and left cerebellum which were not
definitively present on the prior MRI.
5. Stable, mild generalized parenchymal atrophy.
6. A known 6 x 9 mm basilar tip aneurysm is incompletely assessed on
the current exam, but appears grossly unchanged.

## 2019-12-02 MED ORDER — ALBUTEROL SULFATE (2.5 MG/3ML) 0.083% IN NEBU: 2.5000 mg | INHALATION_SOLUTION | RESPIRATORY_TRACT | Status: DC | PRN

## 2019-12-02 MED ORDER — TIOTROPIUM BROMIDE MONOHYDRATE 18 MCG IN CAPS
18.0000 ug | ORAL_CAPSULE | Freq: Every day | RESPIRATORY_TRACT | Status: DC
  Administered 2019-12-02 – 2019-12-03 (×2): 18 ug via RESPIRATORY_TRACT
  Filled 2019-12-02 (×2): qty 5

## 2019-12-02 MED ORDER — ACETAMINOPHEN 325 MG PO TABS: 650.0000 mg | ORAL_TABLET | ORAL | Status: DC | PRN

## 2019-12-02 MED ORDER — ACETAMINOPHEN 160 MG/5ML PO SOLN
650.0000 mg | ORAL | Status: DC | PRN
  Filled 2019-12-02: qty 20.3

## 2019-12-02 MED ORDER — ASPIRIN EC 81 MG PO TBEC
81.0000 mg | DELAYED_RELEASE_TABLET | Freq: Every day | ORAL | Status: DC
  Administered 2019-12-02 – 2019-12-06 (×5): 81 mg via ORAL
  Filled 2019-12-02 (×5): qty 1

## 2019-12-02 MED ORDER — ACETAMINOPHEN 650 MG RE SUPP: 650.0000 mg | RECTAL | Status: DC | PRN

## 2019-12-02 MED ORDER — SODIUM CHLORIDE 0.9 % IV SOLN: INTRAVENOUS | Status: DC

## 2019-12-02 MED ORDER — CLOPIDOGREL BISULFATE 75 MG PO TABS
75.0000 mg | ORAL_TABLET | Freq: Every day | ORAL | Status: DC
  Administered 2019-12-02 – 2019-12-06 (×5): 75 mg via ORAL
  Filled 2019-12-02 (×5): qty 1

## 2019-12-02 MED ORDER — LABETALOL HCL 5 MG/ML IV SOLN
20.0000 mg | Freq: Once | INTRAVENOUS | Status: AC
  Administered 2019-12-02: 20 mg via INTRAVENOUS
  Filled 2019-12-02: qty 4

## 2019-12-02 MED ORDER — ENOXAPARIN SODIUM 40 MG/0.4ML ~~LOC~~ SOLN
40.0000 mg | SUBCUTANEOUS | Status: DC
  Administered 2019-12-02 – 2019-12-03 (×2): 40 mg via SUBCUTANEOUS
  Filled 2019-12-02 (×4): qty 0.4

## 2019-12-02 MED ORDER — AMLODIPINE BESYLATE 5 MG PO TABS: 5.0000 mg | ORAL_TABLET | Freq: Every day | ORAL | Status: DC

## 2019-12-02 MED ORDER — CITALOPRAM HYDROBROMIDE 20 MG PO TABS: 20.0000 mg | ORAL_TABLET | Freq: Every day | ORAL | Status: DC

## 2019-12-02 MED ORDER — OCUVITE-LUTEIN PO CAPS
1.0000 | ORAL_CAPSULE | Freq: Two times a day (BID) | ORAL | Status: DC
  Administered 2019-12-02 – 2019-12-06 (×7): 1 via ORAL
  Filled 2019-12-02 (×11): qty 1

## 2019-12-02 MED ORDER — ASPIRIN 81 MG PO CHEW: 324.0000 mg | CHEWABLE_TABLET | Freq: Once | ORAL | Status: DC

## 2019-12-02 MED ORDER — STROKE: EARLY STAGES OF RECOVERY BOOK: Freq: Once | Status: AC

## 2019-12-02 MED ORDER — SENNOSIDES-DOCUSATE SODIUM 8.6-50 MG PO TABS: 1.0000 | ORAL_TABLET | Freq: Every evening | ORAL | Status: DC | PRN

## 2019-12-02 MED ORDER — GABAPENTIN 100 MG PO CAPS
100.0000 mg | ORAL_CAPSULE | Freq: Two times a day (BID) | ORAL | Status: DC
  Administered 2019-12-02 – 2019-12-06 (×9): 100 mg via ORAL
  Filled 2019-12-02 (×9): qty 1

## 2019-12-02 MED ORDER — IOHEXOL 350 MG/ML SOLN
75.0000 mL | Freq: Once | INTRAVENOUS | Status: AC | PRN
  Administered 2019-12-02: 16:00:00 75 mL via INTRAVENOUS

## 2019-12-02 MED ORDER — AMLODIPINE BESYLATE 5 MG PO TABS
2.5000 mg | ORAL_TABLET | Freq: Every day | ORAL | Status: DC
  Administered 2019-12-02: 11:00:00 2.5 mg via ORAL
  Filled 2019-12-02: qty 1

## 2019-12-02 MED ORDER — FUROSEMIDE 20 MG PO TABS
20.0000 mg | ORAL_TABLET | Freq: Every day | ORAL | Status: DC
  Administered 2019-12-02 – 2019-12-06 (×5): 20 mg via ORAL
  Filled 2019-12-02 (×5): qty 1

## 2019-12-02 MED ORDER — PANTOPRAZOLE SODIUM 40 MG PO TBEC
40.0000 mg | DELAYED_RELEASE_TABLET | Freq: Every day | ORAL | Status: DC
  Administered 2019-12-02 – 2019-12-06 (×5): 40 mg via ORAL
  Filled 2019-12-02 (×6): qty 1

## 2019-12-02 MED ORDER — AMLODIPINE BESYLATE 5 MG PO TABS
2.5000 mg | ORAL_TABLET | Freq: Every day | ORAL | Status: DC
  Administered 2019-12-03: 09:00:00 2.5 mg via ORAL
  Filled 2019-12-02: qty 1

## 2019-12-02 MED ORDER — PRAVASTATIN SODIUM 20 MG PO TABS
40.0000 mg | ORAL_TABLET | Freq: Every day | ORAL | Status: DC
  Administered 2019-12-02 – 2019-12-05 (×4): 40 mg via ORAL
  Filled 2019-12-02 (×4): qty 2

## 2019-12-02 MED ORDER — ROFLUMILAST 500 MCG PO TABS
500.0000 ug | ORAL_TABLET | Freq: Every day | ORAL | Status: DC
  Administered 2019-12-02 – 2019-12-06 (×4): 500 ug via ORAL
  Filled 2019-12-02 (×6): qty 1

## 2019-12-02 MED ORDER — METOPROLOL SUCCINATE ER 25 MG PO TB24
25.0000 mg | ORAL_TABLET | Freq: Every day | ORAL | Status: DC
  Administered 2019-12-02 – 2019-12-06 (×5): 25 mg via ORAL
  Filled 2019-12-02 (×5): qty 1

## 2019-12-02 NOTE — ED Notes (Signed)
Hand off of care and report given to Floor RN

## 2019-12-02 NOTE — Consult Note (Signed)
CARDIOLOGY CONSULT NOTE               Patient ID: Toni White MRN: [id number] DOB/AGE: Mar 02, 1936 84 y.o.  Admit date: 12/01/2019 Referring Physician: Fritzi Mandes, MD  Primary Physician: Birdie Sons, MD   Primary Cardiologist: Bartholome Bill, MD  Reason for Consultation: carotid artery stenosis  HPI: Toni White is a 84 year old female with PMH significant for severe COPD on chronic supplemental O2 at 2L via [place], HTN, dyslipidemia, DM Type II, TIA and basilar aneurysm who was admitted for stroke-like symptoms.   ED course: The patient presented with a 2 day history of slurred speech that progressively worsened upon arrival. She was found to be slightly hypertensive at 142/83, NIH stroke scale total of 1 and EKG was negative for any evidence of acute ischemia. CT head was negative for any acute intracranial abnormalities. The patient was admitted to the stroke unit and Neurology was consulted.   On admission a carotid US and CTA of the head a neck were performed and revealed 80% stenosis of the left ICA. Thus, cardiology was consulted for possible pre-operative clearance for carotid endarterectomy.   Upon evaluation the patient reports to be feeling slightly better and denies any complaints at this time. The patient denies any chest pain, palpitations, syncope, dizziness or unilateral weakness at this time. She reports chronic dyspnea due to her underlying COPD and chronic BLE edema. She states that the slurred speech has improved significantly since admission. The daughter (whom is the POA) is a the bedside and expresses concerns of a possible carotid endarterectomy as her sister previously passed away due to complications from the same procedure at the age of 85. The patient remains stable at this time.    Review of systems complete and found to be negative unless listed above     Past Medical History:  Diagnosis Date  . COPD (chronic obstructive pulmonary disease) ([place])   . DNR  (do not resuscitate) 11/2019   DNR/DNI, confirmed in person by patient and her daughter/POA  . History of chicken pox   . Hyperlipidemia   . Hypertension   . Ulcer     Past Surgical History:  Procedure Laterality Date  . ABDOMINAL HYSTERECTOMY  2003   with BSO due to bleeding  . APPENDECTOMY    . BREAST BIOPSY Left 2005   stereo breast biopsy benign  . BREAST BIOPSY Right 2005   core bx. benign  . BREAST BIOPSY Left 2000?   benign  . BREAST CYST EXCISION Left 1990   incision and draniage of abscess  . CHOLECYSTECTOMY    . COLONOSCOPY  2003   Dr. Vira Agar. Hyperplastic polyps; Single polyp excision from rectum  . CT Scan of Brain  08/20/2013   Mild diffuse cortical atrophy. Mid chronic ischemic Jaskirat Zertuche matter disease. Wide neck basilar tip aneurysm 6x6x32mm on follow up CTA  . [place]  . INCISION AND DRAINAGE BREAST ABSCESS Left 1990  . TONSILLECTOMY  1952  . UPPER GI ENDOSCOPY  02/26/2002   Dr. Tiffany Kocher; Gastritis. Single gastric ectasia    Medications Prior to Admission  Medication Sig Dispense Refill Last Dose  . albuterol (VENTOLIN HFA) 108 (90 BASE) MCG/ACT inhaler Inhale 2 puffs into the lungs. Every 4-6 hours as needed   PRN at PRN  . alendronate (FOSAMAX) 70 MG tablet Take 1 tablet (70 mg total) by mouth once a week. Take with a full glass of water on an empty stomach. (Patient taking  differently: Take 70 mg by mouth once a week. Take with a full glass of water on an empty stomach. (SUNDAYS)) 12 tablet 4 12/01/2019 at Unknown time  . amLODipine (NORVASC) 2.5 MG tablet TAKE 1 TABLET EVERY DAY 90 tablet 4 12/01/2019 at 0630  . aspirin 81 MG tablet Take 81 mg by mouth daily.   12/01/2019 at 0630  . Calcium-Magnesium-Vitamin D (CALCIUM 500 PO) Take 1 tablet by mouth 2 (two) times daily.   12/01/2019 at 0630  . Fluticasone-Salmeterol (ADVAIR) 250-50 MCG/DOSE AEPB Inhale 1 puff into the lungs 2 (two) times daily.   12/01/2019 at 0630  . furosemide (LASIX) 20 MG  tablet TAKE 1 TABLET TWICE DAILY AS NEEDED  FOR  SWELLING (Patient taking differently: Take 20 mg by mouth daily. ) 90 tablet 4 12/01/2019 at 0630  . gabapentin (NEURONTIN) 100 MG capsule Take 1 capsule (100 mg total) by mouth 2 (two) times daily. 180 capsule 3 12/01/2019 at 0630  . levothyroxine (SYNTHROID) 25 MCG tablet TAKE 1 AND 1/2 TABLETS EVERY DAY BEFORE BREAKFAST 135 tablet 4 12/01/2019 at 0630  . metoprolol succinate (TOPROL-XL) 50 MG 24 hr tablet TAKE 1/2 TABLET EVERY DAY 45 tablet 5 12/01/2019 at 0630  . Multiple Vitamins-Minerals (PRESERVISION AREDS 2+MULTI VIT) CAPS Take 1 capsule by mouth 2 (two) times daily.   12/01/2019 at 0630  . pantoprazole (PROTONIX) 40 MG tablet TAKE 1 TABLET EVERY DAY 90 tablet 3 12/01/2019 at 0630  . pravastatin (PRAVACHOL) 40 MG tablet TAKE 1 TABLET (40 MG TOTAL) BY MOUTH AT BEDTIME. 90 tablet 4 Past Week at Unknown time  . predniSONE (DELTASONE) 10 MG tablet Take 3 tablets for 3 days, then 2 tablets for 3 days, then 1 tablet for 3 days, then stop (Patient taking differently: Take 30 mg by mouth daily. FOR 7 DAYS (sTARTED 11/28/19)) 18 tablet 0 12/01/2019 at 0630  . roflumilast (DALIRESP) 500 MCG TABS tablet Take 500 mcg by mouth daily.   12/01/2019 at 0630  . tiotropium (SPIRIVA) 18 MCG inhalation capsule Place 18 mcg into inhaler and inhale daily.   12/01/2019 at 0630  . traMADol (ULTRAM) 50 MG tablet TAKE 1 TABLET BY MOUTH EVERY 8 HOURS (Patient taking differently: Take 50 mg by mouth every 8 (eight) hours as needed. ) 30 tablet 5 PRN at PRN  . citalopram (CELEXA) 20 MG tablet TAKE 1 TABLET EVERY DAY   Not Taking at 0630  . meloxicam (MOBIC) 15 MG tablet TAKE ONE TABLET BY MOUTH EVERY DAY (Patient not taking: Reported on 12/02/2019) 30 tablet 5 Not Taking at Unknown time  . OXYGEN Inhale into the lungs Nightly.      Social History   Socioeconomic History  . Marital status: Widowed    Spouse name: Not on file  . Number of children: 3  . Years of education: Not on  file  . Highest education level: 12th grade  Occupational History  . Occupation: Retired    Comment: Worked in Office manager  . Smoking status: Former Smoker    Packs/day: 1.00    Years: 30.00    Pack years: 30.00    Types: Cigarettes  . Smokeless tobacco: Never Used  . Tobacco comment: quit around 1989  Substance and Sexual Activity  . Alcohol use: No  . Drug use: No  . Sexual activity: Not on file  Other Topics Concern  . Not on file  Social History Narrative  . Not on file   Social Determinants of  Health   Financial Resource Strain:   . Difficulty of Paying Living Expenses:   Food Insecurity:   . Worried About Charity fundraiser in the Last Year:   . Arboriculturist in the Last Year:   Transportation Needs:   . Film/video editor (Medical):   Marland Kitchen Lack of Transportation (Non-Medical):   Physical Activity: Inactive  . Days of Exercise per Week: 0 days  . Minutes of Exercise per Session: 0 min  Stress:   . Feeling of Stress :   Social Connections: Unknown  . Frequency of Communication with Friends and Family: Patient refused  . Frequency of Social Gatherings with Friends and Family: Patient refused  . Attends Religious Services: Patient refused  . Active Member of Clubs or Organizations: Patient refused  . Attends Archivist Meetings: Patient refused  . Marital Status: Patient refused  Intimate Partner Violence: Unknown  . Fear of Current or Ex-Partner: Patient refused  . Emotionally Abused: Patient refused  . Physically Abused: Patient refused  . Sexually Abused: Patient refused    Family History  Problem Relation Age of Onset  . Heart disease Father   . Congestive Heart Failure Father   . Heart disease Mother   . Congestive Heart Failure Mother   . Cancer Brother        lung  . Lung cancer Brother   . Diabetes Daughter   . Diabetes Daughter   . Breast cancer Neg Hx       Review of systems complete and found to be negative unless  listed above      PHYSICAL EXAM  General: Well developed, well nourished, in no acute distress HEENT:  Normocephalic and atramatic Neck:  No JVD. Supple, carotid +2 with bruits  Lungs: expiratory wheezes bilaterally to auscultation Heart: HRRR . Normal S1 and S2 without gallops or murmurs.  Abdomen: Bowel sounds are positive, abdomen soft and non-tender  Msk:   Normal strength and tone for age. Extremities: +1BLE edema. No clubbing or cyanosis  Neuro: Alert and oriented X 3, slurred speech Psych:  Good affect, responds appropriately  Labs:   Lab Results  Component Value Date   WBC 15.3 (H) 12/01/2019   HGB 14.9 12/01/2019   HCT 45.2 12/01/2019   MCV 89.2 12/01/2019   PLT 309 12/01/2019    Recent Labs  Lab 12/01/19 1950  NA 143  K 3.6  CL 96*  CO2 35*  BUN 24*  CREATININE 0.91  CALCIUM 9.7  PROT 7.1  BILITOT 0.7  ALKPHOS 55  ALT 35  AST 27  GLUCOSE 100*   Lab Results  Component Value Date   TROPONINI < 0.02 01/26/2014    Lab Results  Component Value Date   CHOL 179 12/02/2019   CHOL 144 04/23/2019   CHOL 148 12/11/2017   Lab Results  Component Value Date   HDL 54 12/02/2019   HDL 41 04/23/2019   HDL 46 12/11/2017   Lab Results  Component Value Date   LDLCALC 99 12/02/2019   LDLCALC 79 04/23/2019   LDLCALC 72 12/11/2017   Lab Results  Component Value Date   TRIG 130 12/02/2019   TRIG 139 04/23/2019   TRIG 148 12/11/2017   Lab Results  Component Value Date   CHOLHDL 3.3 12/02/2019   CHOLHDL 3.5 04/23/2019   CHOLHDL 3.2 12/11/2017   No results found for: LDLDIRECT    Radiology: CT ANGIO HEAD W OR WO CONTRAST  Result Date:  12/02/2019 CLINICAL DATA:  Carotid artery stenosis. Recent slurred speech and mental status changes. Basilar tip aneurysm. Left carotid stenosis by ultrasound. EXAM: CT ANGIOGRAPHY HEAD AND NECK TECHNIQUE: Multidetector CT imaging of the head and neck was performed using the standard protocol during bolus administration  of intravenous contrast. Multiplanar CT image reconstructions and MIPs were obtained to evaluate the vascular anatomy. Carotid stenosis measurements (when applicable) are obtained utilizing NASCET criteria, using the distal internal carotid diameter as the denominator. CONTRAST:  32mL OMNIPAQUE IOHEXOL 350 MG/ML SOLN COMPARISON:  MRI same day. Ultrasound same day. MRI 08/26/2013. CT angiography 08/19/2013. FINDINGS: CT HEAD FINDINGS Brain: Brain atrophy with chronic small-vessel ischemic changes affecting the pons, thalami, basal ganglia and hemispheric Sherrina Zaugg matter. No sign of acute infarction, mass lesion, hemorrhage, hydrocephalus or extra-axial collection. Vascular: There is atherosclerotic calcification of the major vessels at the base of the brain. 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 See below. Skull: Negative Sinuses: Clear sinuses. Orbits: Lens calcification on the right. Review of the MIP images confirms the above findings CTA NECK FINDINGS Aortic arch: Aortic atherosclerotic calcification. No aneurysm or dissection. Branching pattern is normal without flow limiting stenosis. Right carotid system: Common carotid artery widely patent to the bifurcation and ICA bulb. Somewhat irregular soft and calcified plaque affecting the ICA bulb with minimal diameter of 3.4 mm. Compared to a more distal cervical ICA diameter of 5 mm, this indicates a 30% stenosis. Vessels are tortuous, swinging to the midline behind the oropharynx. Left carotid system: Common carotid artery is patent to the bifurcation. Severe irregular calcified plaque at the carotid bifurcation and ICA bulb. Luminal stenosis likely 1 mm. Compared to a more distal cervical ICA diameter of 5 mm, this indicates an 80% stenosis. Vessels are tortuous, swinging to the midline behind the oropharynx. Vertebral arteries: Calcified plaque at both vertebral artery origin regions but without stenosis greater than 30%. Scattered areas of calcified plaque through the  cervical course of both vertebral arteries but no stenosis greater than 30%. Skeleton: Degenerative cervical spondylosis. Other neck: No mass or lymphadenopathy. Upper chest: Emphysema and pulmonary scarring at the apices. Review of the MIP images confirms the above findings CTA HEAD FINDINGS Anterior circulation: Both internal carotid arteries are patent through the skull base and siphon regions. Ordinary siphon atherosclerotic calcification but without stenosis greater than 30%. Anterior and middle cerebral vessels are patent without large or medium vessel occlusion, aneurysm or vascular malformation. Posterior circulation: Both vertebral arteries are patent through the foramen magnum. There is atherosclerotic disease at both vertebral artery V4 segments with stenosis estimated at 50% on both sides. Both vessels supply the basilar. No basilar stenosis. 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 Venous sinuses: Patent and normal. Anatomic variants: None significant. Review of the MIP images confirms the above findings IMPRESSION: No acute large or medium vessel occlusion. Aortic Atherosclerosis (ICD10-I70.0) and Emphysema (ICD10-J43.9). Atherosclerotic disease at both carotid bifurcation and ICA bulb regions. 30% ICA stenosis on the right. 80% or greater irregular stenosis of the ICA bulb on the left. Vessels are tortuous, swinging to the midline behind the oropharynx. Atherosclerotic disease of both vertebral artery V4 segments with stenoses estimated at 50%. 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 Electronically Signed   By: Nelson Chimes M.D.   On: 12/02/2019 15:56   CT HEAD WO CONTRAST  Result Date: 12/01/2019 CLINICAL  DATA:  Slurred speech x2 days. EXAM: CT HEAD WITHOUT CONTRAST TECHNIQUE: Contiguous axial images were obtained from the base of the skull through the vertex without intravenous  contrast. COMPARISON:  Dec 19, 2017 FINDINGS: Brain: There is mild cerebral atrophy with widening of the extra-axial spaces and ventricular dilatation. There are areas of decreased attenuation within the Arrionna Serena matter tracts of the supratentorial brain, consistent with microvascular disease changes. Small chronic bilateral basal ganglia lacunar infarcts are seen. 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 Skull: Normal. Negative for fracture or focal lesion. Sinuses/Orbits: No acute finding. Other: None. IMPRESSION: 1. Generalized cerebral atrophy. 2. No acute intracranial abnormality. 3. 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 4. Small chronic bilateral basal ganglia lacunar infarcts. Electronically Signed   By: Virgina Norfolk M.D.   On: 12/01/2019 20:16   CT ANGIO NECK W OR WO CONTRAST  Result Date: 12/02/2019 CLINICAL DATA:  Carotid artery stenosis. Recent slurred speech and mental status changes. Basilar tip aneurysm. Left carotid stenosis by ultrasound. EXAM: CT ANGIOGRAPHY HEAD AND NECK TECHNIQUE: Multidetector CT imaging of the head and neck was performed using the standard protocol during bolus administration of intravenous contrast. Multiplanar CT image reconstructions and MIPs were obtained to evaluate the vascular anatomy. Carotid stenosis measurements (when applicable) are obtained utilizing NASCET criteria, using the distal internal carotid diameter as the denominator. CONTRAST:  94mL OMNIPAQUE IOHEXOL 350 MG/ML SOLN COMPARISON:  MRI same day. Ultrasound same day. MRI 08/26/2013. CT angiography 08/19/2013. FINDINGS: CT HEAD FINDINGS Brain: Brain atrophy with chronic small-vessel ischemic changes affecting the pons, thalami, basal ganglia and hemispheric Velda Wendt matter. No sign of acute infarction, mass lesion, hemorrhage, hydrocephalus  or extra-axial collection. Vascular: There is atherosclerotic calcification of the major vessels at the base of the brain. 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 See below. Skull: Negative Sinuses: Clear sinuses. Orbits: Lens calcification on the right. Review of the MIP images confirms the above findings CTA NECK FINDINGS Aortic arch: Aortic atherosclerotic calcification. No aneurysm or dissection. Branching pattern is normal without flow limiting stenosis. Right carotid system: Common carotid artery widely patent to the bifurcation and ICA bulb. Somewhat irregular soft and calcified plaque affecting the ICA bulb with minimal diameter of 3.4 mm. Compared to a more distal cervical ICA diameter of 5 mm, this indicates a 30% stenosis. Vessels are tortuous, swinging to the midline behind the oropharynx. Left carotid system: Common carotid artery is patent to the bifurcation. Severe irregular calcified plaque at the carotid bifurcation and ICA bulb. Luminal stenosis likely 1 mm. Compared to a more distal cervical ICA diameter of 5 mm, this indicates an 80% stenosis. Vessels are tortuous, swinging to the midline behind the oropharynx. Vertebral arteries: Calcified plaque at both vertebral artery origin regions but without stenosis greater than 30%. Scattered areas of calcified plaque through the cervical course of both vertebral arteries but no stenosis greater than 30%. Skeleton: Degenerative cervical spondylosis. Other neck: No mass or lymphadenopathy. Upper chest: Emphysema and pulmonary scarring at the apices. Review of the MIP images confirms the above findings CTA HEAD FINDINGS Anterior circulation: Both internal carotid arteries are patent through the skull base and siphon regions. Ordinary siphon atherosclerotic calcification but without stenosis greater than 30%. Anterior and middle cerebral vessels are patent without large or medium vessel occlusion, aneurysm or vascular malformation. Posterior circulation: Both  vertebral arteries are patent through the  foramen magnum. There is atherosclerotic disease at both vertebral artery V4 segments with stenosis estimated at 50% on both sides. Both vessels supply the basilar. No basilar stenosis. 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 Venous sinuses: Patent and normal. Anatomic variants: None significant. Review of the MIP images confirms the above findings IMPRESSION: No acute large or medium vessel occlusion. Aortic Atherosclerosis (ICD10-I70.0) and Emphysema (ICD10-J43.9). Atherosclerotic disease at both carotid bifurcation and ICA bulb regions. 30% ICA stenosis on the right. 80% or greater irregular stenosis of the ICA bulb on the left. Vessels are tortuous, swinging to the midline behind the oropharynx. Atherosclerotic disease of both vertebral artery V4 segments with stenoses estimated at 50%. 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 Electronically Signed   By: Nelson Chimes M.D.   On: 12/02/2019 15:56   MR BRAIN WO CONTRAST  Result Date: 12/02/2019 CLINICAL DATA:  Neuro deficit, acute, stroke suspected. Additional history provided: Acute onset slurred speech, headache, mild nausea without vomiting EXAM: MRI HEAD WITHOUT CONTRAST TECHNIQUE: Multiplanar, multiecho pulse sequences of the brain and surrounding structures were obtained without intravenous contrast. COMPARISON:  Noncontrast head CT 12/01/2019, noncontrast head CT 12/19/2017, brain MRI 08/26/2013, CT angiogram head 08/19/2013 FINDINGS: Brain: Multiple sequences are significantly motion degraded. Most notably there is moderate motion degradation of the axial and coronal diffusion-weighted sequence, moderate motion degradation of the axial SWI sequence, moderate motion degradation of the axial T1 weighted sequence, moderate/severe motion degradation of the axial T2 FLAIR sequence and  moderate/severe motion degradation of the coronal T2 weighted sequence. Moderate to advanced patchy and confluent T2/FLAIR hyperintensity within the cerebral Josselin Gaulin matter is nonspecific, but consistent with chronic small vessel ischemic disease. Findings have progressed as compared to prior MRI 08/26/2013. Redemonstrated chronic lacunar infarcts within the bilateral basal ganglia. Also again seen, there are chronic small-vessel ischemic changes within the thalami and pons. Chronic microhemorrhages within the right frontal lobe, left periatrial region and left cerebellum not definitively present on a prior MRI. Stable, mild generalized parenchymal atrophy. There is no acute infarct. No evidence of intracranial mass. No extra-axial fluid collection. No midline shift. Vascular: Expected proximal arterial flow voids. 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 Skull and upper cervical spine: No focal marrow lesion Sinuses/Orbits: Visualized orbits show no acute finding. Mild ethmoid and right maxillary sinus mucosal thickening. No significant mastoid effusion. IMPRESSION: 1. Motion degraded examination as described. 2. No evidence of acute intracranial abnormality, including acute infarction. 3. Moderate to advanced chronic small vessel ischemic disease has progressed as compared to prior MRI 08/26/2013. Redemonstrated chronic lacunar infarcts within the bilateral basal ganglia. 4. Nonspecific chronic microhemorrhages within the right frontal lobe, left periatrial region and left cerebellum which were not definitively present on the prior MRI. 5. Stable, mild generalized parenchymal atrophy. 6. 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 Electronically Signed   By: Kellie Simmering DO   On: 12/02/2019 08:26   US Carotid Bilateral (at [facility] and [facility] only)  Result Date:  12/02/2019 CLINICAL DATA:  84 year old female with TIA EXAM: BILATERAL CAROTID DUPLEX ULTRASOUND TECHNIQUE: Pearline Cables scale imaging, color Doppler and duplex ultrasound were performed of bilateral carotid and vertebral arteries in the neck. COMPARISON:  None. FINDINGS:  Criteria: Quantification of carotid stenosis is based on velocity parameters that correlate the residual internal carotid diameter with NASCET-based stenosis levels, using the diameter of the distal internal carotid lumen as the denominator for stenosis measurement. The following velocity measurements were obtained: RIGHT ICA:  Systolic 99991111 cm/sec, Diastolic 21 cm/sec CCA:  69 cm/sec SYSTOLIC ICA/CCA RATIO:  2.0 ECA:  120 cm/sec LEFT ICA:  Systolic 0000000 cm/sec, Diastolic 21 cm/sec CCA:  75 cm/sec SYSTOLIC ICA/CCA RATIO:  3.8 ECA:  1 4 cm/sec Right Brachial SBP: Not acquired Left Brachial SBP: Not acquired RIGHT CAROTID ARTERY: No significant calcifications of the right common carotid artery. Intermediate waveform maintained. Moderate heterogeneous and partially calcified plaque at the right carotid bifurcation. No significant lumen shadowing. Low resistance waveform of the right ICA. Tortuosity RIGHT VERTEBRAL ARTERY: Antegrade flow with low resistance waveform. LEFT CAROTID ARTERY: No significant calcifications of the left common carotid artery. Intermediate waveform maintained. Moderate heterogeneous and partially calcified plaque at the left carotid bifurcation. No significant lumen shadowing. Low resistance waveform of the left ICA. Tortuosity LEFT VERTEBRAL ARTERY:  Antegrade flow with low resistance waveform. IMPRESSION: Right: Color duplex indicates moderate heterogeneous and calcified plaque, with no hemodynamically significant stenosis by duplex criteria in the extracranial cerebrovascular circulation. Left: Heterogeneous and partially calcified plaque at the left carotid bifurcation, with discordant results regarding degree of stenosis by  established duplex criteria. Peak velocity suggests 70% - 99% stenosis, with the ICA/ CCA ratio suggesting a lesser degree of stenosis. If establishing a more accurate degree of stenosis is required, cerebral angiogram should be considered, or as a second best test, CTA. Signed, Dulcy Fanny. Dellia Nims, RPVI [facility] Radiology Electronically Signed   By: Corrie Mckusick D.O.   On: 12/02/2019 11:33   DG Chest Portable 1 View  Result Date: 12/02/2019 CLINICAL DATA:  Slurred speech x2 days. EXAM: PORTABLE CHEST 1 VIEW COMPARISON:  January 26, 2014 FINDINGS: Mild diffuse chronic appearing increased lung markings are seen. There is no evidence of acute infiltrate, pleural effusion or pneumothorax. The cardiac silhouette is moderately enlarged. There is marked severity calcification of the aortic arch. Multilevel degenerative changes seen throughout the thoracic spine. IMPRESSION: Chronic-appearing increased lung markings without evidence of acute or active cardiopulmonary disease. Electronically Signed   By: Virgina Norfolk M.D.   On: 12/02/2019 00:33    EKG: NSR without any specific  ST-T wave changes   ASSESSMENT AND PLAN:  Slurred speech Hypertensive urgency COPD Dyslipidemia    1. Slurred speech likely due to TIA, left ICA stenosis of 80% confirmed by CTA  -Echocardiogram pending. We will provide decision upon pre-operative cardiac clearance once results are finalized.   -Agree with dual antiplatelet therapy  -Neurology consult appreciated  -Recommend Vascular Surgery consult  -Agree with current plan  -Recommend continuous telemetry monitoring.  2. Hypertensive Urgency, reasonably stable at this time  -We will continue management with Amlodipine, metoprolol and Iv labetalol as needed.  -Recommend q4h bp monitoring and heart healthy diet  3. COPD, reasonably stable   -Agree with current management.  -Recommend RT consult  4. Dyslipidemia,  reasonably controlled  -We will continue statin therapy    The patient's history, physical exam findings and plan of care were all discussed with Dr. Loran Senters. [place], whom also evaluated the patient, and all decision making was made in collaboration with him.   Signed: Gladstone Pih, ACNPC-AG  12/02/2019, 5:50 PM

## 2019-12-02 NOTE — TOC Transition Note (Signed)
Transition of Care [facility]) - CM/SW Discharge Note   Patient Details  Name: Toni White MRN: [id number] Date of Birth: 1936/06/08  Transition of Care [facility]) CM/SW Contact:  Elease Hashimoto, LCSW Phone Number: 12/02/2019, 2:53 PM   Clinical Narrative:   Met with pt an daughter who is in the room with her. Pt lives with a female roommate who is wheelchair bound. He does take care of himself as does pt. Pt as been staying in bed more since vision has been getting worse. She is blind in one eye and has a cataract in the other. She is not seeing a flashing light anymore but at times see's numbers. Daughter seem to be supportive and does drive her to appointments. Since pt is not able to. Pt has a rw but wants to wait on a 3 in 1 at this time. She has no pref regarding Home health and is agreeable to trying this. Have made ref to [facility]. Pt already is on home O2-2 liters. Pt glad she can go home and is looking forward to it. Pt set up with HHPT and set up, ready for DC when MD feels stable for DC       Barriers to Discharge: Barriers Resolved   Patient Goals and CMS Choice Patient states their goals for this hospitalization and ongoing recovery are:: I hope to go home today CMS Medicare.gov Compare Post Acute Care list provided to:: Patient Choice offered to / list presented to : Patient  Discharge Placement                       Discharge Plan and Services In-house Referral: Clinical Social Work   Post Acute Care Choice: Home Health                    HH Arranged: PT [facility] Agency: [facility] Date [facility] Agency Contacted: 12/02/19 Time [place]: [id number] Representative spoke with at [place]: brittany  Social Determinants of Health ([place]) Interventions     Readmission Risk Interventions No flowsheet data found.

## 2019-12-02 NOTE — ED Notes (Signed)
Per Dr Sidney Ace, pt is NPO tonight due to a failed swallow screen for new facial droop. Daugher at bedside and leaving shortly. Pt is alert and oriented and able to carry on a conversation.

## 2019-12-02 NOTE — Progress Notes (Signed)
[place] [facility] [place] NAME: Toni White    MR#:  [id number]  DATE OF BIRTH:  05-22-1936  SUBJECTIVE:   Came in with expressive aphasia with some visual disturbance with seen numbers through her left eye. She has poor vision overall. 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 She worked with physical therapy. REVIEW OF SYSTEMS:   Review of Systems  Constitutional: Negative for chills, fever and weight loss.  HENT: Negative for ear discharge, ear pain and nosebleeds.   Eyes: Negative for blurred vision, pain and discharge.  Respiratory: Negative for sputum production, shortness of breath, wheezing and stridor.   Cardiovascular: Negative for chest pain, palpitations, orthopnea and PND.  Gastrointestinal: Negative for abdominal pain, diarrhea, nausea and vomiting.  Genitourinary: Negative for frequency and urgency.  Musculoskeletal: Negative for back pain and joint pain.  Neurological: Positive for speech change and weakness. Negative for sensory change and focal weakness.  Psychiatric/Behavioral: Negative for depression and hallucinations. The patient is not nervous/anxious.    Tolerating Diet:yes Tolerating PT: home health PT  DRUG ALLERGIES:   Allergies  Allergen Reactions  . Flexeril  [Cyclobenzaprine Hcl]     Confusion  . Reglan [Metoclopramide] Hives  . Relafen [Nabumetone] Hives  . Risperdal  [Risperidone]     Confusion Rash  . Sulfa Antibiotics Hives  . Tape Other (See Comments)    Per patient pulls her skin off.  . Triamterene-Hctz     Hypercalcemia  . Hctz [Hydrochlorothiazide] Palpitations    VITALS:  Blood pressure (!) 155/71, pulse 76, temperature 98.4 F (36.9 C), temperature source Oral, resp. rate 20, height 5\' 1"  (1.549 m), weight 76.7 kg, SpO2 100 %.  PHYSICAL EXAMINATION:   Physical Exam  GENERAL:  84 y.o.-year-old patient lying in the bed  with no acute distress. Weak deconditioned EYES: Pupils equal, round, reactive to light and accommodation. No scleral icterus.   HEENT: Head atraumatic, normocephalic. Oropharynx and nasopharynx clear.  NECK:  Supple, no jugular venous distention. No thyroid enlargement, no tenderness.  LUNGS: Normal breath sounds bilaterally, no wheezing, rales, rhonchi. No use of accessory muscles of respiration.  CARDIOVASCULAR: S1, S2 normal. No murmurs, rubs, or gallops.  ABDOMEN: Soft, nontender, nondistended. Bowel sounds present. No organomegaly or mass.  EXTREMITIES: No cyanosis, clubbing or edema b/l.    NEUROLOGIC: legally blind right eye, no focal weakness. appears weak, reflexes symmetric PSYCHIATRIC:  patient is alert and oriented x 3.  SKIN: No obvious rash, lesion, or ulcer.   LABORATORY PANEL:  CBC Recent Labs  Lab 12/01/19 1950  WBC 15.3*  HGB 14.9  HCT 45.2  PLT 309    Chemistries  Recent Labs  Lab 12/01/19 1950  NA 143  K 3.6  CL 96*  CO2 35*  GLUCOSE 100*  BUN 24*  CREATININE 0.91  CALCIUM 9.7  AST 27  ALT 35  ALKPHOS 55  BILITOT 0.7   Cardiac Enzymes No results for input(s): TROPONINI in the last 168 hours. RADIOLOGY:  CT ANGIO HEAD W OR WO CONTRAST  Result Date: 12/02/2019 CLINICAL DATA:  Carotid artery stenosis. Recent slurred speech and mental status changes. Basilar tip aneurysm. Left carotid stenosis by ultrasound. EXAM: CT ANGIOGRAPHY HEAD AND NECK TECHNIQUE: Multidetector CT imaging of the head and neck was performed using the standard protocol during bolus administration of intravenous contrast. Multiplanar CT image reconstructions and MIPs were obtained to evaluate  the vascular anatomy. Carotid stenosis measurements (when applicable) are obtained utilizing NASCET criteria, using the distal internal carotid diameter as the denominator. CONTRAST:  54mL OMNIPAQUE IOHEXOL 350 MG/ML SOLN COMPARISON:  MRI same day. Ultrasound same day. MRI 08/26/2013. CT  angiography 08/19/2013. FINDINGS: CT HEAD FINDINGS Brain: Brain atrophy with chronic small-vessel ischemic changes affecting the pons, thalami, basal ganglia and hemispheric white matter. No sign of acute infarction, mass lesion, hemorrhage, hydrocephalus or extra-axial collection. Vascular: There is atherosclerotic calcification of the major vessels at the base of the brain. 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 See below. Skull: Negative Sinuses: Clear sinuses. Orbits: Lens calcification on the right. Review of the MIP images confirms the above findings CTA NECK FINDINGS Aortic arch: Aortic atherosclerotic calcification. No aneurysm or dissection. Branching pattern is normal without flow limiting stenosis. Right carotid system: Common carotid artery widely patent to the bifurcation and ICA bulb. Somewhat irregular soft and calcified plaque affecting the ICA bulb with minimal diameter of 3.4 mm. Compared to a more distal cervical ICA diameter of 5 mm, this indicates a 30% stenosis. Vessels are tortuous, swinging to the midline behind the oropharynx. Left carotid system: Common carotid artery is patent to the bifurcation. Severe irregular calcified plaque at the carotid bifurcation and ICA bulb. Luminal stenosis likely 1 mm. Compared to a more distal cervical ICA diameter of 5 mm, this indicates an 80% stenosis. Vessels are tortuous, swinging to the midline behind the oropharynx. Vertebral arteries: Calcified plaque at both vertebral artery origin regions but without stenosis greater than 30%. Scattered areas of calcified plaque through the cervical course of both vertebral arteries but no stenosis greater than 30%. Skeleton: Degenerative cervical spondylosis. Other neck: No mass or lymphadenopathy. Upper chest: Emphysema and pulmonary scarring at the apices. Review of the MIP images confirms the above findings CTA HEAD FINDINGS Anterior circulation: Both internal carotid arteries are patent through the skull base and  siphon regions. Ordinary siphon atherosclerotic calcification but without stenosis greater than 30%. Anterior and middle cerebral vessels are patent without large or medium vessel occlusion, aneurysm or vascular malformation. Posterior circulation: Both vertebral arteries are patent through the foramen magnum. There is atherosclerotic disease at both vertebral artery V4 segments with stenosis estimated at 50% on both sides. Both vessels supply the basilar. No basilar stenosis. 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 Venous sinuses: Patent and normal. Anatomic variants: None significant. Review of the MIP images confirms the above findings IMPRESSION: No acute large or medium vessel occlusion. Aortic Atherosclerosis (ICD10-I70.0) and Emphysema (ICD10-J43.9). Atherosclerotic disease at both carotid bifurcation and ICA bulb regions. 30% ICA stenosis on the right. 80% or greater irregular stenosis of the ICA bulb on the left. Vessels are tortuous, swinging to the midline behind the oropharynx. Atherosclerotic disease of both vertebral artery V4 segments with stenoses estimated at 50%. 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 Electronically Signed   By: Nelson Chimes M.D.   On: 12/02/2019 15:56   CT HEAD WO CONTRAST  Result Date: 12/01/2019 CLINICAL DATA:  Slurred speech x2 days. EXAM: CT HEAD WITHOUT CONTRAST TECHNIQUE: Contiguous axial images were obtained from the base of the skull through the vertex without intravenous contrast. COMPARISON:  Dec 19, 2017 FINDINGS: Brain: There is mild cerebral atrophy with widening of the extra-axial spaces and ventricular dilatation. There are areas of decreased attenuation within the white  matter tracts of the supratentorial brain, consistent with microvascular disease changes. Small chronic bilateral basal ganglia lacunar infarcts are seen. Vascular: Stable,  approximately 5.3 mm diameter partially calcified, aneurysmal dilatation of the tip of the basilar artery is seen. Skull: Normal. Negative for fracture or focal lesion. Sinuses/Orbits: No acute finding. Other: None. IMPRESSION: 1. Generalized cerebral atrophy. 2. No acute intracranial abnormality. 3. 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 4. Small chronic bilateral basal ganglia lacunar infarcts. Electronically Signed   By: Virgina Norfolk M.D.   On: 12/01/2019 20:16   CT ANGIO NECK W OR WO CONTRAST  Result Date: 12/02/2019 CLINICAL DATA:  Carotid artery stenosis. Recent slurred speech and mental status changes. Basilar tip aneurysm. Left carotid stenosis by ultrasound. EXAM: CT ANGIOGRAPHY HEAD AND NECK TECHNIQUE: Multidetector CT imaging of the head and neck was performed using the standard protocol during bolus administration of intravenous contrast. Multiplanar CT image reconstructions and MIPs were obtained to evaluate the vascular anatomy. Carotid stenosis measurements (when applicable) are obtained utilizing NASCET criteria, using the distal internal carotid diameter as the denominator. CONTRAST:  66mL OMNIPAQUE IOHEXOL 350 MG/ML SOLN COMPARISON:  MRI same day. Ultrasound same day. MRI 08/26/2013. CT angiography 08/19/2013. FINDINGS: CT HEAD FINDINGS Brain: Brain atrophy with chronic small-vessel ischemic changes affecting the pons, thalami, basal ganglia and hemispheric white matter. No sign of acute infarction, mass lesion, hemorrhage, hydrocephalus or extra-axial collection. Vascular: There is atherosclerotic calcification of the major vessels at the base of the brain. 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 See below. Skull: Negative Sinuses: Clear sinuses. Orbits: Lens calcification on the right. Review of the MIP images confirms the above findings CTA NECK FINDINGS Aortic arch: Aortic atherosclerotic calcification. No aneurysm or  dissection. Branching pattern is normal without flow limiting stenosis. Right carotid system: Common carotid artery widely patent to the bifurcation and ICA bulb. Somewhat irregular soft and calcified plaque affecting the ICA bulb with minimal diameter of 3.4 mm. Compared to a more distal cervical ICA diameter of 5 mm, this indicates a 30% stenosis. Vessels are tortuous, swinging to the midline behind the oropharynx. Left carotid system: Common carotid artery is patent to the bifurcation. Severe irregular calcified plaque at the carotid bifurcation and ICA bulb. Luminal stenosis likely 1 mm. Compared to a more distal cervical ICA diameter of 5 mm, this indicates an 80% stenosis. Vessels are tortuous, swinging to the midline behind the oropharynx. Vertebral arteries: Calcified plaque at both vertebral artery origin regions but without stenosis greater than 30%. Scattered areas of calcified plaque through the cervical course of both vertebral arteries but no stenosis greater than 30%. Skeleton: Degenerative cervical spondylosis. Other neck: No mass or lymphadenopathy. Upper chest: Emphysema and pulmonary scarring at the apices. Review of the MIP images confirms the above findings CTA HEAD FINDINGS Anterior circulation: Both internal carotid arteries are patent through the skull base and siphon regions. Ordinary siphon atherosclerotic calcification but without stenosis greater than 30%. Anterior and middle cerebral vessels are patent without large or medium vessel occlusion, aneurysm or vascular malformation. Posterior circulation: Both vertebral arteries are patent through the foramen magnum. There is atherosclerotic disease at both vertebral artery V4 segments with stenosis estimated at 50% on both sides. Both vessels supply the basilar. No basilar stenosis. 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 Venous sinuses: Patent and normal. Anatomic variants: None significant.  Review of the MIP images  confirms the above findings IMPRESSION: No acute large or medium vessel occlusion. Aortic Atherosclerosis (ICD10-I70.0) and Emphysema (ICD10-J43.9). Atherosclerotic disease at both carotid bifurcation and ICA bulb regions. 30% ICA stenosis on the right. 80% or greater irregular stenosis of the ICA bulb on the left. Vessels are tortuous, swinging to the midline behind the oropharynx. Atherosclerotic disease of both vertebral artery V4 segments with stenoses estimated at 50%. 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 Electronically Signed   By: Nelson Chimes M.D.   On: 12/02/2019 15:56   MR BRAIN WO CONTRAST  Result Date: 12/02/2019 CLINICAL DATA:  Neuro deficit, acute, stroke suspected. Additional history provided: Acute onset slurred speech, headache, mild nausea without vomiting EXAM: MRI HEAD WITHOUT CONTRAST TECHNIQUE: Multiplanar, multiecho pulse sequences of the brain and surrounding structures were obtained without intravenous contrast. COMPARISON:  Noncontrast head CT 12/01/2019, noncontrast head CT 12/19/2017, brain MRI 08/26/2013, CT angiogram head 08/19/2013 FINDINGS: Brain: Multiple sequences are significantly motion degraded. Most notably there is moderate motion degradation of the axial and coronal diffusion-weighted sequence, moderate motion degradation of the axial SWI sequence, moderate motion degradation of the axial T1 weighted sequence, moderate/severe motion degradation of the axial T2 FLAIR sequence and moderate/severe motion degradation of the coronal T2 weighted sequence. Moderate to advanced patchy and confluent T2/FLAIR hyperintensity within the cerebral white matter is nonspecific, but consistent with chronic small vessel ischemic disease. Findings have progressed as compared to prior MRI 08/26/2013. Redemonstrated chronic lacunar infarcts within the bilateral basal ganglia. Also again seen,  there are chronic small-vessel ischemic changes within the thalami and pons. Chronic microhemorrhages within the right frontal lobe, left periatrial region and left cerebellum not definitively present on a prior MRI. Stable, mild generalized parenchymal atrophy. There is no acute infarct. No evidence of intracranial mass. No extra-axial fluid collection. No midline shift. Vascular: Expected proximal arterial flow voids. 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 Skull and upper cervical spine: No focal marrow lesion Sinuses/Orbits: Visualized orbits show no acute finding. Mild ethmoid and right maxillary sinus mucosal thickening. No significant mastoid effusion. IMPRESSION: 1. Motion degraded examination as described. 2. No evidence of acute intracranial abnormality, including acute infarction. 3. Moderate to advanced chronic small vessel ischemic disease has progressed as compared to prior MRI 08/26/2013. Redemonstrated chronic lacunar infarcts within the bilateral basal ganglia. 4. Nonspecific chronic microhemorrhages within the right frontal lobe, left periatrial region and left cerebellum which were not definitively present on the prior MRI. 5. Stable, mild generalized parenchymal atrophy. 6. 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 Electronically Signed   By: Kellie Simmering DO   On: 12/02/2019 08:26   US Carotid Bilateral (at [facility] and [facility] only)  Result Date: 12/02/2019 CLINICAL DATA:  84 year old female with TIA EXAM: BILATERAL CAROTID DUPLEX ULTRASOUND TECHNIQUE: Pearline Cables scale imaging, color Doppler and duplex ultrasound were performed of bilateral carotid and vertebral arteries in the neck. COMPARISON:  None. FINDINGS: Criteria: Quantification of carotid stenosis is based on velocity parameters that correlate the residual internal carotid diameter with  NASCET-based stenosis levels, using the diameter of the distal internal carotid lumen as the denominator for stenosis measurement. The following velocity measurements were obtained: RIGHT ICA:  Systolic 99991111 cm/sec, Diastolic 21 cm/sec CCA:  69 cm/sec SYSTOLIC ICA/CCA RATIO:  2.0 ECA:  120 cm/sec LEFT ICA:  Systolic 0000000 cm/sec, Diastolic 21 cm/sec CCA:  75 cm/sec SYSTOLIC ICA/CCA RATIO:  3.8 ECA:  1 4 cm/sec Right Brachial SBP: Not acquired Left Brachial SBP: Not acquired RIGHT CAROTID ARTERY: No significant calcifications of the right common carotid artery. Intermediate waveform maintained. Moderate heterogeneous and partially calcified plaque at the right carotid bifurcation. No significant lumen shadowing. Low resistance waveform of the right ICA. Tortuosity RIGHT VERTEBRAL ARTERY: Antegrade flow with low resistance waveform. LEFT CAROTID ARTERY: No significant calcifications of the left common carotid artery. Intermediate waveform maintained. Moderate heterogeneous and partially calcified plaque at the left carotid bifurcation. No significant lumen shadowing. Low resistance waveform of the left ICA. Tortuosity LEFT VERTEBRAL ARTERY:  Antegrade flow with low resistance waveform. IMPRESSION: Right: Color duplex indicates moderate heterogeneous and calcified plaque, with no hemodynamically significant stenosis by duplex criteria in the extracranial cerebrovascular circulation. Left: Heterogeneous and partially calcified plaque at the left carotid bifurcation, with discordant results regarding degree of stenosis by established duplex criteria. Peak velocity suggests 70% - 99% stenosis, with the ICA/ CCA ratio suggesting a lesser degree of stenosis. If establishing a more accurate degree of stenosis is required, cerebral angiogram should be considered, or as a second best test, CTA. Signed, Dulcy Fanny. Dellia Nims, RPVI [facility] Electronically Signed   By:  Corrie Mckusick D.O.   On: 12/02/2019 11:33   DG Chest Portable 1 View  Result Date: 12/02/2019 CLINICAL DATA:  Slurred speech x2 days. EXAM: PORTABLE CHEST 1 VIEW COMPARISON:  January 26, 2014 FINDINGS: Mild diffuse chronic appearing increased lung markings are seen. There is no evidence of acute infiltrate, pleural effusion or pneumothorax. The cardiac silhouette is moderately enlarged. There is marked severity calcification of the aortic arch. Multilevel degenerative changes seen throughout the thoracic spine. IMPRESSION: Chronic-appearing increased lung markings without evidence of acute or active cardiopulmonary disease. Electronically Signed   By: Virgina Norfolk M.D.   On: 12/02/2019 00:33   ASSESSMENT AND PLAN:   Toni White  is a 84 y.o. pleasant Caucasian female with a known history of COPD, hypertension and dyslipidemia, who presented to the emergency room with an acute onset of slurred speech that started a couple of days ago without paresthesias or focal muscle weakness.  She admitted to headache and mild nausea without vomiting.  She also had visual hallucinations seeing the numbers on the walls   1.  Slurred speech and visual hallucinations concerning for TIA/left carotid stenosis 80%-- noted on CT angiogram of head and neck - continue her aspirin and add Plavix for dual antiplatelet therapy--per  Neurology recommendaiton -continue statin therapy and check fasting lipids. -MRI brain --no acute CVA, but does show extensive small vessel ischemic changes and evidence of chronic microhemorrhages.  These changes have been progressive over time when compared to old imaging.  --seen by  Dr. Doy Mince  -- by speech therapist patient on regular consistency diet. Her speech has improved -physical therapy recommends home health PT. -- Vascular consultation with Dr. Delana Meyer-- carotid artery stenosis -- cardiology consultation with Dr. Clayborn Bigness-- cardiac clearance for carotid artery procedure plan  for Friday  2.  Hypertensive urgency. -Blood pressure much improved. -The patient will be placed on as needed IV labetalol and will continue her antihypertensives with mildly permissive parameters. -- On amlodipine and metoprolol adjust dose according to blood pressure  3.  Hypothyroidism. -continue Synthroid and check TSH level.  4.   COPD. -continue  her Spiriva, her Daliresp and as needed albuterol and hold off Advair Diskus.  5. GERD. - continue PPI therapy.  6.  Depression/anxiety. - continue Celexa.  7.  Dyslipidemia. -continue statin therapy.  8.  DVT prophylaxis. -Subcutaneous Lovenox.  Procedures: Family communication : daughter Irene Shipper in the room Consults : vascular, neurology, cardiology CODE STATUS: DNR prior to admission DVT Prophylaxis : Lovenox  Status is: Inpatient  Remains inpatient appropriate because:Ongoing diagnostic testing needed not appropriate for outpatient work up  Needs Carotid artery procedure    Dispo: The patient is from: Home              Anticipated d/c is to: Home              Anticipated d/c date is: > 3 days              Patient currently is not medically stable to d/c.         TOTAL TIME TAKING CARE OF THIS PATIENT: *35* minutes.  >50% time spent on counselling and coordination of care  Note: This dictation was prepared with Dragon dictation along with smaller phrase technology. Any transcriptional errors that result from this process are unintentional.  Fritzi Mandes M.D    [facility]   CC: Primary care physician; Birdie Sons, MDPatient ID: Diona Browner, female   DOB: 1936-01-26, 84 y.o.   MRN: [id number]

## 2019-12-02 NOTE — ED Notes (Signed)
Pt taken to floor with ED tech via stretcher on monitor.

## 2019-12-02 NOTE — Plan of Care (Signed)
  Problem: Education: Goal: Knowledge of secondary prevention will improve Outcome: Progressing Goal: Knowledge of patient specific risk factors addressed and post discharge goals established will improve Outcome: Progressing   Problem: Coping: Goal: Will verbalize positive feelings about self Outcome: Progressing Goal: Will identify appropriate support needs Outcome: Progressing   Problem: Health Behavior/Discharge Planning: Goal: Ability to manage health-related needs will improve Outcome: Progressing   Problem: Self-Care: Goal: Ability to participate in self-care as condition permits will improve Outcome: Progressing Goal: Verbalization of feelings and concerns over difficulty with self-care will improve Outcome: Progressing Goal: Ability to communicate needs accurately will improve Outcome: Progressing   Problem: Nutrition: Goal: Risk of aspiration will decrease Outcome: Progressing   Problem: Ischemic Stroke/TIA Tissue Perfusion: Goal: Complications of ischemic stroke/TIA will be minimized Outcome: Progressing   Problem: Health Behavior/Discharge Planning: Goal: Ability to manage health-related needs will improve Outcome: Progressing   Problem: Clinical Measurements: Goal: Ability to maintain clinical measurements within normal limits will improve Outcome: Progressing Goal: Will remain free from infection Outcome: Progressing Goal: Diagnostic test results will improve Outcome: Progressing Goal: Respiratory complications will improve Outcome: Progressing   Problem: Activity: Goal: Risk for activity intolerance will decrease Outcome: Progressing   Problem: Nutrition: Goal: Adequate nutrition will be maintained Outcome: Progressing   Problem: Coping: Goal: Level of anxiety will decrease Outcome: Progressing   Problem: Elimination: Goal: Will not experience complications related to bowel motility Outcome: Progressing Goal: Will not experience  complications related to urinary retention Outcome: Progressing   Problem: Pain Managment: Goal: General experience of comfort will improve Outcome: Progressing   Problem: Safety: Goal: Ability to remain free from injury will improve Outcome: Progressing   Problem: Skin Integrity: Goal: Risk for impaired skin integrity will decrease Outcome: Progressing   Problem: Education: Goal: Knowledge of General Education information will improve Description: Including pain rating scale, medication(s)/side effects and non-pharmacologic comfort measures Outcome: Progressing

## 2019-12-02 NOTE — Consult Note (Signed)
[facility] Consult Note  MRN : [id number]  Toni White is a 84 y.o. (09/23/1935) female who presents with chief complaint of  Chief Complaint  Patient presents with  . Aphasia  .  History of Present Illness:   I am asked to evaluate the patient by Dr. Fritzi Mandes.  The patient is an 84 year old woman who was brought to [facility] emergency room yesterday at approximately midnight with a chief complaint of slurred speech.  She was brought to the hospital by her daughter who is also the power of attorney.  She reports that the speech disturbance have been going on for approximately 2 days.  It was also associated with some visual changes.  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  Also of note the patient is on 2 L of oxygen by nasal cannula at home typically only at night but recently she has been using it throughout the day as well.  There was no history of any focal motor deficit or sensory changes of the arms or legs.  She is right-handed.  In the emergency room she was given 4 baby aspirin later she was started on Plavix as well.  Over the course of this initial 24 hours her speech disturbance has largely resolved and she has had no further visual disturbances.  MRI was obtained and is reviewed by me personally it is negative for acute infarct.  CT angiogram of the head neck was obtained and is reviewed by me personally.  It demonstrates the left internal carotid artery is 80 to 90% stenotic with a moderate to severely calcified lesion although not circumferential.  There is a significant amount of tortuosity which extends the internal carotid on the left just past the midline of the neck almost retroesophageal.  Also incidental noting of bovine arch.  No ostial lesions of the great vessels.    Current Facility-Administered Medications  Medication Dose Route Frequency Provider Last Rate Last Admin  .  acetaminophen (TYLENOL) tablet 650 mg  650 mg Oral Q4H PRN Mansy, Arvella Merles, MD       Or  . acetaminophen (TYLENOL) 160 MG/5ML solution 650 mg  650 mg Per Tube Q4H PRN Mansy, Arvella Merles, MD       Or  . acetaminophen (TYLENOL) suppository 650 mg  650 mg Rectal Q4H PRN Mansy, Jan A, MD      . albuterol (PROVENTIL) (2.5 MG/3ML) 0.083% nebulizer solution 2.5 mg  2.5 mg Inhalation Q4H PRN Mansy, Jan A, MD      . Derrill Memo ON 12/03/2019] amLODipine (NORVASC) tablet 2.5 mg  2.5 mg Oral Daily White, Delicia, NP      . aspirin EC tablet 81 mg  81 mg Oral Daily Mansy, Jan A, MD   81 mg at 12/02/19 1108  . clopidogrel (PLAVIX) tablet 75 mg  75 mg Oral Daily Mansy, Jan A, MD   75 mg at 12/02/19 1116  . enoxaparin (LOVENOX) injection 40 mg  40 mg Subcutaneous Q24H Mansy, Jan A, MD   40 mg at 12/02/19 0355  . furosemide (LASIX) tablet 20 mg  20 mg Oral Daily Mansy, Jan A, MD   20 mg at 12/02/19 1117  . gabapentin (NEURONTIN) capsule 100 mg  100 mg Oral BID Mansy, Jan A, MD   100 mg at 12/02/19 1109  . metoprolol succinate (TOPROL-XL) 24 hr tablet 25 mg  25 mg Oral Daily Mansy,  Jan A, MD   25 mg at 12/02/19 1116  . multivitamin-lutein (OCUVITE-LUTEIN) capsule 1 capsule  1 capsule Oral BID Mansy, Jan A, MD   1 capsule at 12/02/19 1108  . pantoprazole (PROTONIX) EC tablet 40 mg  40 mg Oral Daily Mansy, Jan A, MD   40 mg at 12/02/19 1109  . pravastatin (PRAVACHOL) tablet 40 mg  40 mg Oral QHS Mansy, Jan A, MD      . roflumilast (DALIRESP) tablet 500 mcg  500 mcg Oral Daily Mansy, Jan A, MD   500 mcg at 12/02/19 1108  . senna-docusate (Senokot-S) tablet 1 tablet  1 tablet Oral QHS PRN Mansy, Jan A, MD      . tiotropium [facility]) inhalation capsule [facility] use ONLY) 18 mcg  18 mcg Inhalation Daily Mansy, Jan A, MD   18 mcg at 12/02/19 1118    Past Medical History:  Diagnosis Date  . COPD (chronic obstructive pulmonary disease) ([place])   . DNR (do not resuscitate) 11/2019   DNR/DNI, confirmed in person by patient and her  daughter/POA  . History of chicken pox   . Hyperlipidemia   . Hypertension   . Ulcer     Past Surgical History:  Procedure Laterality Date  . ABDOMINAL HYSTERECTOMY  2003   with BSO due to bleeding  . APPENDECTOMY    . BREAST BIOPSY Left 2005   stereo breast biopsy benign  . BREAST BIOPSY Right 2005   core bx. benign  . BREAST BIOPSY Left 2000?   benign  . BREAST CYST EXCISION Left 1990   incision and draniage of abscess  . CHOLECYSTECTOMY    . COLONOSCOPY  2003   Dr. Vira Agar. Hyperplastic polyps; Single polyp excision from rectum  . CT Scan of Brain  08/20/2013   Mild diffuse cortical atrophy. Mid chronic ischemic white matter disease. 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 [place]  . INCISION AND DRAINAGE BREAST ABSCESS Left 1990  . TONSILLECTOMY  1952  . UPPER GI ENDOSCOPY  02/26/2002   Dr. Tiffany Kocher; Gastritis. Single gastric ectasia    Social History Social History   Tobacco Use  . Smoking status: Former Smoker    Packs/day: 1.00    Years: 30.00    Pack years: 30.00    Types: Cigarettes  . Smokeless tobacco: Never Used  . Tobacco comment: quit around 1989  Substance Use Topics  . Alcohol use: No  . Drug use: No    Family History Family History  Problem Relation Age of Onset  . Heart disease Father   . Congestive Heart Failure Father   . Heart disease Mother   . Congestive Heart Failure Mother   . Cancer Brother        lung  . Lung cancer Brother   . Diabetes Daughter   . Diabetes Daughter   . Breast cancer Neg Hx   No family history of bleeding/clotting disorders, porphyria or autoimmune disease   Allergies  Allergen Reactions  . Flexeril  [Cyclobenzaprine Hcl]     Confusion  . Reglan [Metoclopramide] Hives  . Relafen [Nabumetone] Hives  . Risperdal  [Risperidone]     Confusion Rash  . Sulfa Antibiotics Hives  . Tape Other (See Comments)    Per patient pulls her skin off.  . Triamterene-Hctz      Hypercalcemia  . Hctz [Hydrochlorothiazide] Palpitations     REVIEW OF SYSTEMS (Negative unless checked)  Constitutional: [] Weight loss  []   Fever  [] Chills Cardiac: [] Chest pain   [] Chest pressure   [] Palpitations   [] Shortness of breath when laying flat   [] Shortness of breath at rest   [] Shortness of breath with exertion. Vascular:  [] Pain in legs with walking   [] Pain in legs at rest   [] Pain in legs when laying flat   [] Claudication   [] Pain in feet when walking  [] Pain in feet at rest  [] Pain in feet when laying flat   [] History of DVT   [] Phlebitis   [] Swelling in legs   [] Varicose veins   [] Non-healing ulcers Pulmonary:   [x] Uses home oxygen   [] Productive cough   [] Hemoptysis   [] Wheeze  [x] COPD   [] Asthma Neurologic:  [] Dizziness  [] Blackouts   [] Seizures   [x] History of stroke   [x] History of TIA  [x] Aphasia   [] Temporary blindness   [] Dysphagia   [] Weakness or numbness in arms   [] Weakness or numbness in legs Musculoskeletal:  [] Arthritis   [] Joint swelling   [] Joint pain   [] Low back pain Hematologic:  [] Easy bruising  [] Easy bleeding   [] Hypercoagulable state   [] Anemic  [] Hepatitis Gastrointestinal:  [] Blood in stool   [] Vomiting blood  [] Gastroesophageal reflux/heartburn   [] Difficulty swallowing. Genitourinary:  [] Chronic kidney disease   [] Difficult urination  [] Frequent urination  [] Burning with urination   [] Blood in urine Skin:  [] Rashes   [] Ulcers   [] Wounds Psychological:  [] History of anxiety   []  History of major depression.  Physical Examination  Vitals:   12/02/19 0458 12/02/19 0654 12/02/19 1143 12/02/19 1601  BP: (!) 153/77 (!) 142/63 (!) 156/69 (!) 155/71  Pulse: 69 70 70 76  Resp: 18 18  20   Temp: 98.1 F (36.7 C) (!) 97.4 F (36.3 C) 98 F (36.7 C) 98.4 F (36.9 C)  TempSrc:   Oral Oral  SpO2: 100% 98% 98% 100%  Weight:      Height:       Body mass index is 31.93 kg/m. Gen:  WD/WN, NAD Head: Yell/AT, No temporalis wasting.  Ear/Nose/Throat: Hearing  grossly intact, nares w/o erythema or drainage,  Eyes: Sclera non-icteric, conjunctiva clear Neck: Trachea midline.  No JVD.  Pulmonary:  Good air movement, respirations not labored, equal bilaterally.  Cardiac: RRR, normal S1, S2. Vascular: Bilatera carotid bruit Vessel Right Left  Radial Palpable Palpable  Brachial Palpable Palpable  Carotid Palpable, with bruit Palpable, with bruit  Gastrointestinal: soft, non-distended. No guarding/reflex.  Musculoskeletal: M/S 5/5 throughout.  Extremities without ischemic changes.   Neurologic: Sensation grossly intact in extremities.  Symmetrical.  Speech is fluent. Diminished vision right eye. Motor exam as listed above. Psychiatric: Judgment intact, Mood & affect appropriate for pt's clinical situation.   CBC Lab Results  Component Value Date   WBC 15.3 (H) 12/01/2019   HGB 14.9 12/01/2019   HCT 45.2 12/01/2019   MCV 89.2 12/01/2019   PLT 309 12/01/2019    BMET    Component Value Date/Time   NA 143 12/01/2019 1950   NA 141 04/23/2019 1007   NA 141 01/27/2014 0551   K 3.6 12/01/2019 1950   K 4.3 01/27/2014 0551   CL 96 (L) 12/01/2019 1950   CL 103 01/27/2014 0551   CO2 35 (H) 12/01/2019 1950   CO2 33 (H) 01/27/2014 0551   GLUCOSE 100 (H) 12/01/2019 1950   GLUCOSE 139 (H) 01/27/2014 0551   BUN 24 (H) 12/01/2019 1950   BUN 22 04/23/2019 1007   BUN 21 (H) 01/27/2014 [id number]  CREATININE 0.91 12/01/2019 1950   CREATININE 0.91 05/02/2017 0854   CALCIUM 9.7 12/01/2019 1950   CALCIUM 10.5 (H) 01/27/2014 0551   GFRNONAA 59 (L) 12/01/2019 1950   GFRNONAA 60 05/02/2017 0854   GFRAA >60 12/01/2019 1950   GFRAA 70 05/02/2017 0854   Estimated Creatinine Clearance: 45.5 mL/min (by C-G formula based on SCr of 0.91 mg/dL).  COAG Lab Results  Component Value Date   INR 1.0 12/01/2019    Radiology CT ANGIO HEAD W OR WO CONTRAST  Result Date: 12/02/2019 CLINICAL DATA:  Carotid artery stenosis. Recent slurred speech and mental status  changes. Basilar tip aneurysm. Left carotid stenosis by ultrasound. EXAM: CT ANGIOGRAPHY HEAD AND NECK TECHNIQUE: Multidetector CT imaging of the head and neck was performed using the standard protocol during bolus administration of intravenous contrast. Multiplanar CT image reconstructions and MIPs were obtained to evaluate the vascular anatomy. Carotid stenosis measurements (when applicable) are obtained utilizing NASCET criteria, using the distal internal carotid diameter as the denominator. CONTRAST:  80mL OMNIPAQUE IOHEXOL 350 MG/ML SOLN COMPARISON:  MRI same day. Ultrasound same day. MRI 08/26/2013. CT angiography 08/19/2013. FINDINGS: CT HEAD FINDINGS Brain: Brain atrophy with chronic small-vessel ischemic changes affecting the pons, thalami, basal ganglia and hemispheric white matter. No sign of acute infarction, mass lesion, hemorrhage, hydrocephalus or extra-axial collection. Vascular: There is atherosclerotic calcification of the major vessels at the base of the brain. 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 See below. Skull: Negative Sinuses: Clear sinuses. Orbits: Lens calcification on the right. Review of the MIP images confirms the above findings CTA NECK FINDINGS Aortic arch: Aortic atherosclerotic calcification. No aneurysm or dissection. Branching pattern is normal without flow limiting stenosis. Right carotid system: Common carotid artery widely patent to the bifurcation and ICA bulb. Somewhat irregular soft and calcified plaque affecting the ICA bulb with minimal diameter of 3.4 mm. Compared to a more distal cervical ICA diameter of 5 mm, this indicates a 30% stenosis. Vessels are tortuous, swinging to the midline behind the oropharynx. Left carotid system: Common carotid artery is patent to the bifurcation. Severe irregular calcified plaque at the carotid bifurcation and ICA bulb. Luminal stenosis likely 1 mm. Compared to a more distal cervical ICA diameter of 5 mm, this indicates an 80% stenosis.  Vessels are tortuous, swinging to the midline behind the oropharynx. Vertebral arteries: Calcified plaque at both vertebral artery origin regions but without stenosis greater than 30%. Scattered areas of calcified plaque through the cervical course of both vertebral arteries but no stenosis greater than 30%. Skeleton: Degenerative cervical spondylosis. Other neck: No mass or lymphadenopathy. Upper chest: Emphysema and pulmonary scarring at the apices. Review of the MIP images confirms the above findings CTA HEAD FINDINGS Anterior circulation: Both internal carotid arteries are patent through the skull base and siphon regions. Ordinary siphon atherosclerotic calcification but without stenosis greater than 30%. Anterior and middle cerebral vessels are patent without large or medium vessel occlusion, aneurysm or vascular malformation. Posterior circulation: Both vertebral arteries are patent through the foramen magnum. There is atherosclerotic disease at both vertebral artery V4 segments with stenosis estimated at 50% on both sides. Both vessels supply the basilar. No basilar stenosis. 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 Venous sinuses: Patent and normal. Anatomic variants: None significant. Review of the MIP images confirms the above findings IMPRESSION: No acute large or medium vessel occlusion. Aortic Atherosclerosis (ICD10-I70.0) and Emphysema (ICD10-J43.9). Atherosclerotic disease at both carotid bifurcation  and ICA bulb regions. 30% ICA stenosis on the right. 80% or greater irregular stenosis of the ICA bulb on the left. Vessels are tortuous, swinging to the midline behind the oropharynx. Atherosclerotic disease of both vertebral artery V4 segments with stenoses estimated at 50%. 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 Electronically Signed   By: Nelson Chimes M.D.   On: 12/02/2019 15:56    CT HEAD WO CONTRAST  Result Date: 12/01/2019 CLINICAL DATA:  Slurred speech x2 days. EXAM: CT HEAD WITHOUT CONTRAST TECHNIQUE: Contiguous axial images were obtained from the base of the skull through the vertex without intravenous contrast. COMPARISON:  Dec 19, 2017 FINDINGS: Brain: There is mild cerebral atrophy with widening of the extra-axial spaces and ventricular dilatation. There are areas of decreased attenuation within the white matter tracts of the supratentorial brain, consistent with microvascular disease changes. Small chronic bilateral basal ganglia lacunar infarcts are seen. 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 Skull: Normal. Negative for fracture or focal lesion. Sinuses/Orbits: No acute finding. Other: None. IMPRESSION: 1. Generalized cerebral atrophy. 2. No acute intracranial abnormality. 3. 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 4. Small chronic bilateral basal ganglia lacunar infarcts. Electronically Signed   By: Virgina Norfolk M.D.   On: 12/01/2019 20:16   CT ANGIO NECK W OR WO CONTRAST  Result Date: 12/02/2019 CLINICAL DATA:  Carotid artery stenosis. Recent slurred speech and mental status changes. Basilar tip aneurysm. Left carotid stenosis by ultrasound. EXAM: CT ANGIOGRAPHY HEAD AND NECK TECHNIQUE: Multidetector CT imaging of the head and neck was performed using the standard protocol during bolus administration of intravenous contrast. Multiplanar CT image reconstructions and MIPs were obtained to evaluate the vascular anatomy. Carotid stenosis measurements (when applicable) are obtained utilizing NASCET criteria, using the distal internal carotid diameter as the denominator. CONTRAST:  82mL OMNIPAQUE IOHEXOL 350 MG/ML SOLN COMPARISON:  MRI same day. Ultrasound same day. MRI 08/26/2013. CT angiography  08/19/2013. FINDINGS: CT HEAD FINDINGS Brain: Brain atrophy with chronic small-vessel ischemic changes affecting the pons, thalami, basal ganglia and hemispheric white matter. No sign of acute infarction, mass lesion, hemorrhage, hydrocephalus or extra-axial collection. Vascular: There is atherosclerotic calcification of the major vessels at the base of the brain. 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 See below. Skull: Negative Sinuses: Clear sinuses. Orbits: Lens calcification on the right. Review of the MIP images confirms the above findings CTA NECK FINDINGS Aortic arch: Aortic atherosclerotic calcification. No aneurysm or dissection. Branching pattern is normal without flow limiting stenosis. Right carotid system: Common carotid artery widely patent to the bifurcation and ICA bulb. Somewhat irregular soft and calcified plaque affecting the ICA bulb with minimal diameter of 3.4 mm. Compared to a more distal cervical ICA diameter of 5 mm, this indicates a 30% stenosis. Vessels are tortuous, swinging to the midline behind the oropharynx. Left carotid system: Common carotid artery is patent to the bifurcation. Severe irregular calcified plaque at the carotid bifurcation and ICA bulb. Luminal stenosis likely 1 mm. Compared to a more distal cervical ICA diameter of 5 mm, this indicates an 80% stenosis. Vessels are tortuous, swinging to the midline behind the oropharynx. Vertebral arteries: Calcified plaque at both vertebral artery origin regions but without stenosis greater than 30%. Scattered areas of calcified plaque through the cervical course of both  vertebral arteries but no stenosis greater than 30%. Skeleton: Degenerative cervical spondylosis. Other neck: No mass or lymphadenopathy. Upper chest: Emphysema and pulmonary scarring at the apices. Review of the MIP images confirms the above findings CTA HEAD FINDINGS Anterior circulation: Both internal carotid arteries are patent through the skull base and siphon  regions. Ordinary siphon atherosclerotic calcification but without stenosis greater than 30%. Anterior and middle cerebral vessels are patent without large or medium vessel occlusion, aneurysm or vascular malformation. Posterior circulation: Both vertebral arteries are patent through the foramen magnum. There is atherosclerotic disease at both vertebral artery V4 segments with stenosis estimated at 50% on both sides. Both vessels supply the basilar. No basilar stenosis. 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 Venous sinuses: Patent and normal. Anatomic variants: None significant. Review of the MIP images confirms the above findings IMPRESSION: No acute large or medium vessel occlusion. Aortic Atherosclerosis (ICD10-I70.0) and Emphysema (ICD10-J43.9). Atherosclerotic disease at both carotid bifurcation and ICA bulb regions. 30% ICA stenosis on the right. 80% or greater irregular stenosis of the ICA bulb on the left. Vessels are tortuous, swinging to the midline behind the oropharynx. Atherosclerotic disease of both vertebral artery V4 segments with stenoses estimated at 50%. 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 Electronically Signed   By: Nelson Chimes M.D.   On: 12/02/2019 15:56   MR BRAIN WO CONTRAST  Result Date: 12/02/2019 CLINICAL DATA:  Neuro deficit, acute, stroke suspected. Additional history provided: Acute onset slurred speech, headache, mild nausea without vomiting EXAM: MRI HEAD WITHOUT CONTRAST TECHNIQUE: Multiplanar, multiecho pulse sequences of the brain and surrounding structures were obtained without intravenous contrast. COMPARISON:  Noncontrast head CT 12/01/2019, noncontrast head CT 12/19/2017, brain MRI 08/26/2013, CT angiogram head 08/19/2013 FINDINGS: Brain: Multiple sequences are significantly motion degraded. Most notably there is moderate motion degradation of the  axial and coronal diffusion-weighted sequence, moderate motion degradation of the axial SWI sequence, moderate motion degradation of the axial T1 weighted sequence, moderate/severe motion degradation of the axial T2 FLAIR sequence and moderate/severe motion degradation of the coronal T2 weighted sequence. Moderate to advanced patchy and confluent T2/FLAIR hyperintensity within the cerebral white matter is nonspecific, but consistent with chronic small vessel ischemic disease. Findings have progressed as compared to prior MRI 08/26/2013. Redemonstrated chronic lacunar infarcts within the bilateral basal ganglia. Also again seen, there are chronic small-vessel ischemic changes within the thalami and pons. Chronic microhemorrhages within the right frontal lobe, left periatrial region and left cerebellum not definitively present on a prior MRI. Stable, mild generalized parenchymal atrophy. There is no acute infarct. No evidence of intracranial mass. No extra-axial fluid collection. No midline shift. Vascular: Expected proximal arterial flow voids. 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 Skull and upper cervical spine: No focal marrow lesion Sinuses/Orbits: Visualized orbits show no acute finding. Mild ethmoid and right maxillary sinus mucosal thickening. No significant mastoid effusion. IMPRESSION: 1. Motion degraded examination as described. 2. No evidence of acute intracranial abnormality, including acute infarction. 3. Moderate to advanced chronic small vessel ischemic disease has progressed as compared to prior MRI 08/26/2013. Redemonstrated chronic lacunar infarcts within the bilateral basal ganglia. 4. Nonspecific chronic microhemorrhages within the right frontal lobe, left periatrial region and left cerebellum which were not definitively present on the prior MRI. 5.  Stable, mild generalized parenchymal atrophy. 6. A known 6 x 9 mm  basilar tip aneurysm is incompletely assessed on the current exam, but appears grossly unchanged. Electronically Signed   By: Kellie Simmering DO   On: 12/02/2019 08:26   US Carotid Bilateral (at [facility] and [facility] only)  Result Date: 12/02/2019 CLINICAL DATA:  84 year old female with TIA EXAM: BILATERAL CAROTID DUPLEX ULTRASOUND TECHNIQUE: Pearline Cables scale imaging, color Doppler and duplex ultrasound were performed of bilateral carotid and vertebral arteries in the neck. COMPARISON:  None. FINDINGS: Criteria: Quantification of carotid stenosis is based on velocity parameters that correlate the residual internal carotid diameter with NASCET-based stenosis levels, using the diameter of the distal internal carotid lumen as the denominator for stenosis measurement. The following velocity measurements were obtained: RIGHT ICA:  Systolic 99991111 cm/sec, Diastolic 21 cm/sec CCA:  69 cm/sec SYSTOLIC ICA/CCA RATIO:  2.0 ECA:  120 cm/sec LEFT ICA:  Systolic 0000000 cm/sec, Diastolic 21 cm/sec CCA:  75 cm/sec SYSTOLIC ICA/CCA RATIO:  3.8 ECA:  1 4 cm/sec Right Brachial SBP: Not acquired Left Brachial SBP: Not acquired RIGHT CAROTID ARTERY: No significant calcifications of the right common carotid artery. Intermediate waveform maintained. Moderate heterogeneous and partially calcified plaque at the right carotid bifurcation. No significant lumen shadowing. Low resistance waveform of the right ICA. Tortuosity RIGHT VERTEBRAL ARTERY: Antegrade flow with low resistance waveform. LEFT CAROTID ARTERY: No significant calcifications of the left common carotid artery. Intermediate waveform maintained. Moderate heterogeneous and partially calcified plaque at the left carotid bifurcation. No significant lumen shadowing. Low resistance waveform of the left ICA. Tortuosity LEFT VERTEBRAL ARTERY:  Antegrade flow with low resistance waveform. IMPRESSION: Right: Color duplex indicates moderate heterogeneous and calcified plaque, with no hemodynamically  significant stenosis by duplex criteria in the extracranial cerebrovascular circulation. Left: Heterogeneous and partially calcified plaque at the left carotid bifurcation, with discordant results regarding degree of stenosis by established duplex criteria. Peak velocity suggests 70% - 99% stenosis, with the ICA/ CCA ratio suggesting a lesser degree of stenosis. If establishing a more accurate degree of stenosis is required, cerebral angiogram should be considered, or as a second best test, CTA. Signed, Dulcy Fanny. Dellia Nims, RPVI Vascular and Interventional Radiology [facility] Radiology Electronically Signed   By: Corrie Mckusick D.O.   On: 12/02/2019 11:33   DG Chest Portable 1 View  Result Date: 12/02/2019 CLINICAL DATA:  Slurred speech x2 days. EXAM: PORTABLE CHEST 1 VIEW COMPARISON:  January 26, 2014 FINDINGS: Mild diffuse chronic appearing increased lung markings are seen. There is no evidence of acute infiltrate, pleural effusion or pneumothorax. The cardiac silhouette is moderately enlarged. There is marked severity calcification of the aortic arch. Multilevel degenerative changes seen throughout the thoracic spine. IMPRESSION: Chronic-appearing increased lung markings without evidence of acute or active cardiopulmonary disease. Electronically Signed   By: Virgina Norfolk M.D.   On: 12/02/2019 00:33      Assessment/Plan 1.  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  This in addition with her advanced COPD and oxygen dependence would favor stent placement.  She is already been started on appropriate dual antiplatelet  therapy and this will be continued pre  and post procedure.  I will ask cardiology to see her.  2.  COPD:  Continue oxygen by nasal cannula as ordered.  Continue pulmonary medications and aerosols as already ordered, these medications have been reviewed and there are no changes at this time.   3.  Hypertension:  Continue antihypertensive medications as already ordered, these medications have been reviewed and there are no changes at this time.  4.  Hyperlipidemia:  Given the planned stenting procedure statin therapy is appropriate and so her Pravachol will be continued.   Hortencia Pilar, MD  12/02/2019 6:17 PM    This note was created with Dragon medical transcription system.  Any error is purely unintentional

## 2019-12-02 NOTE — Evaluation (Signed)
Speech Language Pathology Evaluation Patient Details Name: Toni White MRN: [id number] DOB: Apr 09, 1936 Today's Date: 12/02/2019 Time: [id number] SLP Time Calculation (min) (ACUTE ONLY): 33 min  Problem List:  Patient Active Problem List   Diagnosis Date Noted  . CVA (cerebral vascular accident) ([place]) 12/02/2019  . Varicose veins of both legs with edema 05/02/2017  . Recurrent major depressive disorder, in partial remission ([place]) 06/08/2015  . Hypersomnolence 06/08/2015  . Macular degeneration 06/08/2015  . Sciatica 06/08/2015  . LBP (low back pain) 06/08/2015  . OP (osteoporosis) 06/08/2015  . Bibasilar crackles 06/08/2015  . Edema extremities 06/08/2015  . Tremor 03/17/2015  . 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 Diffuse cystic mastopathy 06/26/2013  . Myalgia and myositis 07/06/2009  . Hypoxemia 07/06/2009  . Hyperlipidemia 05/19/2008  . Prediabetes 12/18/2007  . One eye: total vision impairment; other eye: normal vision 06/29/2007  . Essential (primary) hypertension 08/07/1998  . Chronic obstructive pulmonary disease ([place]) 08/07/1998   Past Medical History:  Past Medical History:  Diagnosis Date  . COPD (chronic obstructive pulmonary disease) ([place])   . DNR (do not resuscitate) 11/2019   DNR/DNI, confirmed in person by patient and her daughter/POA  . History of chicken pox   . Hyperlipidemia   . Hypertension   . Ulcer    Past Surgical History:  Past Surgical History:  Procedure Laterality Date  . ABDOMINAL HYSTERECTOMY  2003   with BSO due to bleeding  . APPENDECTOMY    . BREAST BIOPSY Left 2005   stereo breast biopsy benign  . BREAST BIOPSY Right 2005   core bx. benign  . BREAST BIOPSY Left 2000?   benign  . BREAST CYST EXCISION Left 1990   incision and draniage of abscess  . CHOLECYSTECTOMY    . COLONOSCOPY  2003   Dr. Vira Agar. Hyperplastic polyps; Single polyp excision from rectum  . CT Scan of Brain  08/20/2013   Mild diffuse cortical  atrophy. Mid chronic ischemic white matter disease. Wide neck basilar tip aneurysm 6x6x73mm on follow up CTA  . [place]  . INCISION AND DRAINAGE BREAST ABSCESS Left 1990  . TONSILLECTOMY  1952  . UPPER GI ENDOSCOPY  02/26/2002   Dr. Tiffany Kocher; Gastritis. Single gastric ectasia   HPI:  Toni White is 84 y/o female who presented to the emergency room with an acute onset of slurred speech that started a couple of days ago without paresthesias or focal muscle weakness. She admitted to headache and mild nausea without vomiting.  She also had visual hallucinations seeing the numbers on the walls. Past medical history includes COPD, hypertension and dyslipidemia. Noncontrasted head CT scan revealed generalized cerebral atrophy with stable approximately 5.3 mm diameter partially calcified aneurysmal dilatation of the tip of the basilar artery as well as small chronic bilateral basal ganglia lacunar infarcts with no acute intracranial normalities. MRI results are pending.    Assessment / Plan / Recommendation Clinical Impression  Pt's cognitive linguistic abilities appeared to be at baseline. At baseline, pt lives with a friend who helps pt manage money and medication d/t pt's baseline vision impairments. Pt's daughter also provides support such as grocery shopping. Pt is mildly hard of hearing. Pt is A & O x 4. At this time, pt's oral motor exam is unremarkable for any weaknesses. Her speech is intelligible and free of any word finding deficits. At this time, skilled ST is not indicated. ST to sign off.     SLP Assessment  SLP Recommendation/Assessment: Patient does not need any further Speech Lanaguage Pathology Services SLP Visit Diagnosis: Cognitive communication deficit (R41.841)    Follow Up Recommendations  None    Frequency and Duration   N/A        SLP Evaluation Cognition  Overall Cognitive Status: Within Functional Limits for tasks assessed Arousal/Alertness:  Awake/alert Orientation Level: Oriented X4       Comprehension  Auditory Comprehension Overall Auditory Comprehension: Appears within functional limits for tasks assessed Visual Recognition/Discrimination Discrimination: Not tested(d/t baseline impairments in vision) Reading Comprehension Reading Status: Unable to assess (comment)(d/t baseline vision impairments)    Expression Expression Primary Mode of Expression: Verbal Verbal Expression Overall Verbal Expression: Appears within functional limits for tasks assessed Written Expression Dominant Hand: Right Written Expression: Unable to assess (comment)(d/t baseline vision impairments)   Oral / Motor  Oral Motor/Sensory Function Overall Oral Motor/Sensory Function: Within functional limits Motor Speech Overall Motor Speech: Appears within functional limits for tasks assessed Respiration: Within functional limits Phonation: Normal Resonance: Within functional limits Articulation: Within functional limitis Intelligibility: Intelligible Motor Planning: Witnin functional limits Motor Speech Errors: Not applicable   GO                   Vihaan Gloss B. Rutherford Nail M.S., [place], [place] Office [phone]

## 2019-12-02 NOTE — Evaluation (Signed)
Clinical/Bedside Swallow Evaluation Patient Details  Name: JONEISHA DEMANN MRN: [id number] Date of Birth: [birth year]  Today's Date: 12/02/2019 Time: SLP Start Time (ACUTE ONLY): 0805 SLP Stop Time (ACUTE ONLY): 0835 SLP Time Calculation (min) (ACUTE ONLY): 30 min  Past Medical History:  Past Medical History:  Diagnosis Date  . COPD (chronic obstructive pulmonary disease) ([place])   . DNR (do not resuscitate) 11/2019   DNR/DNI, confirmed in person by patient and her daughter/POA  . History of chicken pox   . Hyperlipidemia   . Hypertension   . Ulcer    Past Surgical History:  Past Surgical History:  Procedure Laterality Date  . ABDOMINAL HYSTERECTOMY  2003   with BSO due to bleeding  . APPENDECTOMY    . BREAST BIOPSY Left 2005   stereo breast biopsy benign  . BREAST BIOPSY Right 2005   core bx. benign  . BREAST BIOPSY Left 2000?   benign  . BREAST CYST EXCISION Left 1990   incision and draniage of abscess  . CHOLECYSTECTOMY    . COLONOSCOPY  2003   Dr. Vira Agar. Hyperplastic polyps; Single polyp excision from rectum  . CT Scan of Brain  08/20/2013   Mild diffuse cortical atrophy. Mid chronic ischemic white matter disease. Wide neck basilar tip aneurysm 6x6x23mm on follow up CTA  . [place]  . INCISION AND DRAINAGE BREAST ABSCESS Left 1990  . TONSILLECTOMY  1952  . UPPER GI ENDOSCOPY  02/26/2002   Dr. Tiffany Kocher; Gastritis. Single gastric ectasia   HPI:  Akayla Braniff is 84 y/o female who presented to the emergency room with an acute onset of slurred speech that started a couple of days ago without paresthesias or focal muscle weakness. She admitted to headache and mild nausea without vomiting.  She also had visual hallucinations seeing the numbers on the walls. Past medical history includes COPD, hypertension and dyslipidemia. Noncontrasted head CT scan revealed generalized cerebral atrophy with stable approximately 5.3 mm diameter partially calcified aneurysmal  dilatation of the tip of the basilar artery as well as small chronic bilateral basal ganglia lacunar infarcts with no acute intracranial normalities. MRI results are pending.    Assessment / Plan / Recommendation Clinical Impression  Pt is at reduced risk of aspiration when following general aspiration precautions during consumption of regular food items and thin liquids. During this evaluation, pt consumed ice chips, thin liquids via cup and straw, puree and solids without any evidence of aspiration or dysphagia. Pt has baseline vision impairments and physically gropes for food items, therefore recommend that she have set-up assist and assist with self-feeding. At this time, pt doesn't required skilled ST services. Please reconsult if pt condition changes.  SLP Visit Diagnosis: Dysphagia, unspecified (R13.10)    Aspiration Risk  No limitations    Diet Recommendation   Age appropriate regular with thin liquids  Medication Administration: Whole meds with liquid    Other  Recommendations Oral Care Recommendations: Oral care BID   Follow up Recommendations None      Frequency and Duration   N/A         Prognosis   N/A     Swallow Study   General Date of Onset: 12/01/19 HPI: Jamauria Chappell is 84 y/o female who presented to the emergency room with an acute onset of slurred speech that started a couple of days ago without paresthesias or focal muscle weakness. She admitted to headache and mild nausea without vomiting.  She also had visual hallucinations  seeing the numbers on the walls. Past medical history includes COPD, hypertension and dyslipidemia. Noncontrasted head CT scan revealed generalized cerebral atrophy with stable approximately 5.3 mm diameter partially calcified aneurysmal dilatation of the tip of the basilar artery as well as small chronic bilateral basal ganglia lacunar infarcts with no acute intracranial normalities. MRI results are pending.  Type of Study: Bedside Swallow  Evaluation Previous Swallow Assessment: none in chart Diet Prior to this Study: NPO(d/t failed [facility] Swallow Screen) Temperature Spikes Noted: No Respiratory Status: Nasal cannula(2 liters via [place]) History of Recent Intubation: No Behavior/Cognition: Alert;Cooperative;Pleasant mood Oral Cavity Assessment: Within Functional Limits Oral Care Completed by SLP: No Oral Cavity - Dentition: Adequate natural dentition Vision: Impaired for self-feeding Self-Feeding Abilities: Needs assist;Needs set up(d/t baseline vision impairments) Patient Positioning: Upright in bed Baseline Vocal Quality: Normal Volitional Cough: Strong Volitional Swallow: Able to elicit    Oral/Motor/Sensory Function Overall Oral Motor/Sensory Function: Within functional limits   Ice Chips Ice chips: Within functional limits Presentation: Spoon Other Comments: 3 trials administered   Thin Liquid Thin Liquid: Within functional limits Presentation: Cup;Straw;Self Fed Other Comments: 8 oz thin liquids consumed    Nectar Thick Nectar Thick Liquid: Not tested   Honey Thick Honey Thick Liquid: Not tested   Puree Puree: Within functional limits Presentation: Self Fed;Spoon Other Comments: container of applesauce   Solid     Solid: Within functional limits Presentation: Self Fed Other Comments: graham cracker with peanut butter     Chidinma Clites B. Rutherford Nail M.S., [place], [place] Office [phone]

## 2019-12-02 NOTE — Progress Notes (Signed)
*  PRELIMINARY RESULTS* Echocardiogram 2D Echocardiogram has been performed.  Toni White 12/02/2019, 2:21 PM

## 2019-12-02 NOTE — Plan of Care (Signed)
  Problem: Education: Goal: Knowledge of secondary prevention will improve Outcome: Progressing Goal: Knowledge of patient specific risk factors addressed and post discharge goals established will improve Outcome: Progressing   Problem: Coping: Goal: Will verbalize positive feelings about self Outcome: Progressing Goal: Will identify appropriate support needs Outcome: Progressing   Problem: Health Behavior/Discharge Planning: Goal: Ability to manage health-related needs will improve Outcome: Progressing   Problem: Self-Care: Goal: Ability to participate in self-care as condition permits will improve Outcome: Progressing Goal: Verbalization of feelings and concerns over difficulty with self-care will improve Outcome: Progressing Goal: Ability to communicate needs accurately will improve Outcome: Progressing   Problem: Nutrition: Goal: Risk of aspiration will decrease Outcome: Progressing   Problem: Ischemic Stroke/TIA Tissue Perfusion: Goal: Complications of ischemic stroke/TIA will be minimized Outcome: Progressing   Problem: Health Behavior/Discharge Planning: Goal: Ability to manage health-related needs will improve Outcome: Progressing   Problem: Clinical Measurements: Goal: Ability to maintain clinical measurements within normal limits will improve Outcome: Progressing Goal: Will remain free from infection Outcome: Progressing Goal: Diagnostic test results will improve Outcome: Progressing Goal: Respiratory complications will improve Outcome: Progressing   Problem: Activity: Goal: Risk for activity intolerance will decrease Outcome: Progressing   Problem: Nutrition: Goal: Adequate nutrition will be maintained Outcome: Progressing   Problem: Coping: Goal: Level of anxiety will decrease Outcome: Progressing   Problem: Elimination: Goal: Will not experience complications related to bowel motility Outcome: Progressing Goal: Will not experience  complications related to urinary retention Outcome: Progressing   Problem: Pain Managment: Goal: General experience of comfort will improve Outcome: Progressing   Problem: Safety: Goal: Ability to remain free from injury will improve Outcome: Progressing   Problem: Skin Integrity: Goal: Risk for impaired skin integrity will decrease Outcome: Progressing   

## 2019-12-02 NOTE — H&P (Signed)
[facility] NAME: Toni White    MR#:  [id number]  DATE OF BIRTH:  06-04-1936  DATE OF ADMISSION: 12/02/2019  PRIMARY CARE PHYSICIAN: Birdie Sons, MD   REQUESTING/REFERRING PHYSICIAN: Hinda Kehr, MD  CHIEF COMPLAINT:   Chief Complaint  Patient presents with  . Aphasia    HISTORY OF PRESENT ILLNESS:  Toni White  is a 84 y.o. pleasant Caucasian female with a known history of COPD, hypertension and dyslipidemia, who presented to the emergency room with an acute onset of slurred speech that started a couple of days ago without paresthesias or focal muscle weakness.  She admitted to headache and mild nausea without vomiting.  She also had visual hallucinations seeing the numbers on the walls.  She denied any chest pain or palpitations.  No fever or chills.  No tinnitus or vertigo.  No new urinary or stool incontinence.  No dysuria, oliguria or hematuria flank pain.  No cough or wheezing or hemoptysis.  No COVID-19 exposure.  Upon presentation to the emergency room, blood pressure was 142/83 and otherwise vital signs were within normal.  Later on blood pressure was 155/137 then it was up to [id number] systolic.  Labs revealed mild hypokalemia and a CO2 35 with leukocytosis 15.3 on her CBC with neutrophilia.  COVID-19 PCR and influenza antigens came back negative.  UA was unremarkable.  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  The patient was given 4 baby aspirin.  Will be admitted to a medically monitored bed for further evaluation and management. PAST MEDICAL HISTORY:   Past Medical History:    Diagnosis Date  . COPD (chronic obstructive pulmonary disease) ([place])   . DNR (do not resuscitate) 11/2019   DNR/DNI, confirmed in person by patient and her daughter/POA  . History of chicken pox   . Hyperlipidemia   . Hypertension   . Ulcer     PAST SURGICAL HISTORY:   Past Surgical History:  Procedure Laterality Date  . ABDOMINAL HYSTERECTOMY  2003   with BSO due to bleeding  . APPENDECTOMY    . BREAST BIOPSY Left 2005   stereo breast biopsy benign  . BREAST BIOPSY Right 2005   core bx. benign  . BREAST BIOPSY Left 2000?   benign  . BREAST CYST EXCISION Left 1990   incision and draniage of abscess  . CHOLECYSTECTOMY    . COLONOSCOPY  2003   Dr. Vira Agar. Hyperplastic polyps; Single polyp excision from rectum  . CT Scan of Brain  08/20/2013   Mild diffuse cortical atrophy. Mid chronic ischemic white matter disease. Wide neck basilar tip aneurysm 6x6x68mm on follow up CTA  . [place]  . INCISION AND DRAINAGE BREAST ABSCESS Left 1990  . TONSILLECTOMY  1952  . UPPER GI ENDOSCOPY  02/26/2002   Dr. Tiffany Kocher; Gastritis. Single gastric ectasia    SOCIAL HISTORY:   Social History   Tobacco Use  . Smoking status: Former Smoker    Packs/day: 1.00    Years: 30.00    Pack years: 30.00    Types: Cigarettes  . Smokeless tobacco: Never Used  . Tobacco comment: quit around 1989  Substance Use Topics  .  Alcohol use: No    FAMILY HISTORY:   Family History  Problem Relation Age of Onset  . Heart disease Father   . Congestive Heart Failure Father   . Heart disease Mother   . Congestive Heart Failure Mother   . Cancer Brother        lung  . Lung cancer Brother   . Diabetes Daughter   . Diabetes Daughter   . Breast cancer Neg Hx     DRUG ALLERGIES:   Allergies  Allergen Reactions  . Flexeril  [Cyclobenzaprine Hcl]     Confusion  . Reglan [Metoclopramide] Hives  . Relafen [Nabumetone] Hives  . Risperdal  [Risperidone]     Confusion Rash  .  Sulfa Antibiotics Hives  . Tape Other (See Comments)    Per patient pulls her skin off.  . Triamterene-Hctz     Hypercalcemia  . Hctz [Hydrochlorothiazide] Palpitations    REVIEW OF SYSTEMS:   ROS As per history of present illness. All pertinent systems were reviewed above. Constitutional,  HEENT, cardiovascular, respiratory, GI, GU, musculoskeletal, neuro, psychiatric, endocrine,  integumentary and hematologic systems were reviewed and are otherwise  negative/unremarkable except for positive findings mentioned above in the HPI.   MEDICATIONS AT HOME:   Prior to Admission medications   Medication Sig Start Date End Date Taking? Authorizing Provider  traMADol (ULTRAM) 50 MG tablet TAKE 1 TABLET BY MOUTH EVERY 8 HOURS 11/27/19   Birdie Sons, MD  albuterol (VENTOLIN HFA) 108 (90 BASE) MCG/ACT inhaler Inhale 2 puffs into the lungs. Every 4-6 hours as needed 08/31/11   [provider]  alendronate (FOSAMAX) 70 MG tablet Take 1 tablet (70 mg total) by mouth once a week. Take with a full glass of water on an empty stomach. 12/10/18   Birdie Sons, MD  amLODipine (NORVASC) 2.5 MG tablet TAKE 1 TABLET EVERY DAY 10/27/19   Birdie Sons, MD  aspirin 81 MG tablet Take 81 mg by mouth daily.    [provider]  Calcium-Magnesium-Vitamin D (CALCIUM 500 PO) Take 1 tablet by mouth 2 (two) times daily. 06/14/07   [provider]  citalopram (CELEXA) 20 MG tablet TAKE 1 TABLET EVERY DAY 02/22/16   [provider]  Fluticasone-Salmeterol (ADVAIR) 250-50 MCG/DOSE AEPB Inhale 1 puff into the lungs 2 (two) times daily.    [provider]  furosemide (LASIX) 20 MG tablet TAKE 1 TABLET TWICE DAILY AS NEEDED  FOR  SWELLING 06/24/19   Birdie Sons, MD  gabapentin (NEURONTIN) 100 MG capsule Take 1 capsule (100 mg total) by mouth 2 (two) times daily. 03/24/19   Birdie Sons, MD  levothyroxine (SYNTHROID) 25 MCG tablet TAKE 1 AND 1/2 TABLETS EVERY DAY BEFORE  BREAKFAST 06/06/19   Birdie Sons, MD  meloxicam (MOBIC) 15 MG tablet TAKE ONE TABLET BY MOUTH EVERY DAY 12/18/18   Birdie Sons, MD  metoprolol succinate (TOPROL-XL) 50 MG 24 hr tablet TAKE 1/2 TABLET EVERY DAY 02/27/19   Birdie Sons, MD  Multiple Vitamins-Minerals (PRESERVISION AREDS 2+MULTI VIT) CAPS Take 1 capsule by mouth 2 (two) times daily.    [provider]  OXYGEN Inhale into the lungs Nightly.    [provider]  pantoprazole (PROTONIX) 40 MG tablet TAKE 1 TABLET EVERY DAY 07/24/19   Birdie Sons, MD  pravastatin (PRAVACHOL) 40 MG tablet TAKE 1 TABLET (40 MG TOTAL) BY MOUTH AT BEDTIME. 12/31/18   Birdie Sons, MD  predniSONE (  DELTASONE) 10 MG tablet Take 3 tablets for 3 days, then 2 tablets for 3 days, then 1 tablet for 3 days, then stop 11/25/19   Birdie Sons, MD  roflumilast (DALIRESP) 500 MCG TABS tablet Take 500 mcg by mouth daily.    [provider]  tiotropium (SPIRIVA) 18 MCG inhalation capsule Place 18 mcg into inhaler and inhale daily.    [provider]      VITAL SIGNS:  Blood pressure (!) 192/105, pulse 77, temperature 98.4 F (36.9 C), temperature source Oral, resp. rate (!) 21, height 5\' 1"  (1.549 m), weight 76.7 kg, SpO2 99 %.  PHYSICAL EXAMINATION:  Physical Exam  GENERAL:  84 y.o.-year-old pleasant Caucasian patient lying in the bed with no acute distress.  EYES: Pupils equal, round, reactive to light and accommodation. No scleral icterus. Extraocular muscles intact.  HEENT: Head atraumatic, normocephalic. Oropharynx and nasopharynx clear.  NECK:  Supple, no jugular venous distention. No thyroid enlargement, no tenderness.  LUNGS: Normal breath sounds bilaterally, no wheezing, rales,rhonchi or crepitation. No use of accessory muscles of respiration.  CARDIOVASCULAR: Regular rate and rhythm, S1, S2 normal. No murmurs, rubs, or gallops.  ABDOMEN: Soft, nondistended, nontender. Bowel sounds present. No  organomegaly or mass.  EXTREMITIES: No pedal edema, cyanosis, or clubbing.  NEUROLOGIC: Cranial nerves II through XII are intact except for minimally slurred speech. Muscle strength 5/5 in all extremities. Sensation intact. Gait not checked.  PSYCHIATRIC: The patient is alert and oriented x 3.  Normal affect and good eye contact. SKIN: No obvious rash, lesion, or ulcer.   LABORATORY PANEL:   CBC Recent Labs  Lab 12/01/19 1950  WBC 15.3*  HGB 14.9  HCT 45.2  PLT 309   ------------------------------------------------------------------------------------------------------------------  Chemistries  Recent Labs  Lab 12/01/19 1950  NA 143  K 3.6  CL 96*  CO2 35*  GLUCOSE 100*  BUN 24*  CREATININE 0.91  CALCIUM 9.7  AST 27  ALT 35  ALKPHOS 55  BILITOT 0.7   ------------------------------------------------------------------------------------------------------------------  Cardiac Enzymes No results for input(s): TROPONINI in the last 168 hours. ------------------------------------------------------------------------------------------------------------------  RADIOLOGY:  CT HEAD WO CONTRAST  Result Date: 12/01/2019 CLINICAL DATA:  Slurred speech x2 days. EXAM: CT HEAD WITHOUT CONTRAST TECHNIQUE: Contiguous axial images were obtained from the base of the skull through the vertex without intravenous contrast. COMPARISON:  Dec 19, 2017 FINDINGS: Brain: There is mild cerebral atrophy with widening of the extra-axial spaces and ventricular dilatation. There are areas of decreased attenuation within the white matter tracts of the supratentorial brain, consistent with microvascular disease changes. Small chronic bilateral basal ganglia lacunar infarcts are seen. 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 Skull: Normal. Negative for fracture or focal lesion. Sinuses/Orbits: No acute finding. Other:  None. IMPRESSION: 1. Generalized cerebral atrophy. 2. No acute intracranial abnormality. 3. 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 4. Small chronic bilateral basal ganglia lacunar infarcts. Electronically Signed   By: Virgina Norfolk M.D.   On: 12/01/2019 20:16      IMPRESSION AND PLAN:   1.  Slurred speech and visual hallucinations concerning for TIA/evolving CVA. -The patient will be admitted to an observation medical monitored bed. -We will follow neuro checks every 4 hours for 24 hours. -We will continue her aspirin and add Plavix for dual antiplatelet therapy. -We will continue statin therapy and check fasting lipids. -We will  obtain a brain MRI without contrast as well as bilateral carotid Doppler and 2D echo with bubble study for further assessment. -We will obtain a neurology consultation as well as speech therapy consults, PT and OT consult. -I notified Dr. Doy Mince about the patient.  2.  Hypertensive urgency. -This could be contributing to lacunar infarcts causing her CVA. -The patient will be placed on as needed IV labetalol and will continue her antihypertensives with mildly permissive parameters.  3.  Hypothyroidism. -We will continue Synthroid and check TSH level.  4.   COPD. -We will continue her Spiriva, her Daliresp and as needed albuterol and hold off Advair Diskus.  5. GERD. -We will continue PPI therapy.  6.  Depression/anxiety. -We will continue Celexa.  7.  Dyslipidemia. -We will continue statin therapy.  8.  DVT prophylaxis. -Subcutaneous Lovenox.   All the records are reviewed and case discussed with ED provider. The plan of care was discussed in details with the patient (and family). I answered all questions. The patient agreed to proceed with the above mentioned plan. Further management will depend upon hospital course.   CODE STATUS: Full code  Status is:  Observation  The patient remains OBS appropriate and will d/c before 2 midnights.  Dispo: The patient is from: Home              Anticipated d/c is to: Home              Anticipated d/c date is: 1 day              Patient currently is not medically stable to d/c.   TOTAL TIME TAKING CARE OF THIS PATIENT: 55 minutes.    Christel Mormon M.D on 12/02/2019 at 12:16 AM  [facility]   From 7 PM-7 AM, contact night-coverage [email or web address]  CC: Primary care physician; Birdie Sons, MD   Note: This dictation was prepared with Dragon dictation along with smaller phrase technology. Any transcriptional errors that result from this process are unintentional.

## 2019-12-02 NOTE — Progress Notes (Signed)
OT Cancellation Note  Patient Details Name: Toni White MRN: [id number] DOB: November 28, 1935   Cancelled Treatment:    Reason Eval/Treat Not Completed: Patient at procedure or test/ unavailable  OT consult received and chart reviewed. Pt going off floor at this time for CT. Will as able for OT evaluation. Thank you.  Gerrianne Scale, [place], OTR/L ascom [phone] 12/02/19, 3:30 PM

## 2019-12-02 NOTE — Consult Note (Signed)
Reason for Consult:Hallucinations, slurred speech Requesting Physician: Posey Pronto  CC: Hallucinations and slurred speech  I have been asked by Dr. Posey Pronto to see this patient in consultation for slurred speech.  HPI: Toni White is an 84 y.o. female with a history of COPD, HLD, HTN who reports that for the past 3-4 days she has had slurred speech.  Also reports that she has been seeing numbers where there are no numbers.  Her hallucinations seem to be restricted to numbers.  On further conversation though it appears that she is seeing a lot of black dots in her visual field, some of which seem to form numbers.  There are no colors involved.   Patient reports that she lives with a female partner.  He is wheelchair bound per her report.  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  Unable to see well out of the left eye due to a cataract that she has been unwilling to let the eye doctor treat for fear that she may go blind.  Reports not driving for over a year because she did not feel she was safe.  Does not cook.  Has trouble dressing herself due to her balance.  Reports she is supposed to use a walker but does not like to so she holds onto the walls.  Spends much of her time at home and in the bed.    Past Medical History:  Diagnosis Date  . COPD (chronic obstructive pulmonary disease) ([facility])   . DNR (do not resuscitate) 11/2019   DNR/DNI, confirmed in person by patient and her daughter/POA  . History of chicken pox   . Hyperlipidemia   . Hypertension   . Ulcer     Past Surgical History:  Procedure Laterality Date  . ABDOMINAL HYSTERECTOMY  2003   with BSO due to bleeding  . APPENDECTOMY    . BREAST BIOPSY Left 2005   stereo breast biopsy benign  . BREAST BIOPSY Right 2005   core bx. benign  . BREAST BIOPSY Left 2000?   benign  . BREAST CYST EXCISION Left 1990   incision and draniage of abscess  . CHOLECYSTECTOMY    . COLONOSCOPY  2003   Dr. Vira Agar. Hyperplastic polyps; Single  polyp excision from rectum  . CT Scan of Brain  08/20/2013   Mild diffuse cortical atrophy. Mid chronic ischemic white matter disease. 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 INC[place] AND DRAINAGE BRE[place] ABSCESS Left 1990  . TONSILLECTOMY  1952  . UPPER GI ENDOSCOPY  02/26/2002   Dr. ETiffany Kocher Gastritis. Single gastric ectasia    Family History  Problem Relation Age of Onset  . Heart disease Father   . Congestive Heart Failure Father   . Heart disease Mother   . Congestive Heart Failure Mother   . Cancer Brother        lung  . Lung cancer Brother   . Diabetes Daughter   . Diabetes Daughter   . Breast cancer Neg Hx     Social History:  reports that she has quit smoking. Her smoking use included cigarettes. She has a 30.00 pack-year smoking history. She has never used smokeless tobacco. She reports that she does not drink alcohol or use drugs.  Allergies  Allergen Reactions  . Flexeril  [Cyclobenzaprine Hcl]     Confusion  . Reglan [Metoclopramide] Hives  . Relafen [Nabumetone] Hives  . Risperdal  [Risperidone]  Confusion Rash  . Sulfa Antibiotics Hives  . Tape Other (See Comments)    Per patient pulls her skin off.  . Triamterene-Hctz     Hypercalcemia  . Hctz [Hydrochlorothiazide] Palpitations    Medications:  I have reviewed the patient's current medications. Prior to Admission:  Medications Prior to Admission  Medication Sig Dispense Refill Last Dose  . albuterol (VENTOLIN HFA) 108 (90 BASE) MCG/ACT inhaler Inhale 2 puffs into the lungs. Every 4-6 hours as needed   PRN at PRN  . alendronate (FOSAMAX) 70 MG tablet Take 1 tablet (70 mg total) by mouth once a week. Take with a full glass of water on an empty stomach. (Patient taking differently: Take 70 mg by mouth once a week. Take with a full glass of water on an empty stomach. (SUNDAYS)) 12 tablet 4 12/01/2019 at Unknown time  . amLODipine (NORVASC) 2.5 MG tablet  TAKE 1 TABLET EVERY DAY 90 tablet 4 12/01/2019 at 0630  . aspirin 81 MG tablet Take 81 mg by mouth daily.   12/01/2019 at 0630  . Calcium-Magnesium-Vitamin D (CALCIUM 500 PO) Take 1 tablet by mouth 2 (two) times daily.   12/01/2019 at 0630  . Fluticasone-Salmeterol (ADVAIR) 250-50 MCG/DOSE AEPB Inhale 1 puff into the lungs 2 (two) times daily.   12/01/2019 at 0630  . furosemide (LASIX) 20 MG tablet TAKE 1 TABLET TWICE DAILY AS NEEDED  FOR  SWELLING (Patient taking differently: Take 20 mg by mouth daily. ) 90 tablet 4 12/01/2019 at 0630  . gabapentin (NEURONTIN) 100 MG capsule Take 1 capsule (100 mg total) by mouth 2 (two) times daily. 180 capsule 3 12/01/2019 at 0630  . levothyroxine (SYNTHROID) 25 MCG tablet TAKE 1 AND 1/2 TABLETS EVERY DAY BEFORE BREAKFAST 135 tablet 4 12/01/2019 at 0630  . metoprolol succinate (TOPROL-XL) 50 MG 24 hr tablet TAKE 1/2 TABLET EVERY DAY 45 tablet 5 12/01/2019 at 0630  . Multiple Vitamins-Minerals (PRESERVISION AREDS 2+MULTI VIT) CAPS Take 1 capsule by mouth 2 (two) times daily.   12/01/2019 at 0630  . pantoprazole (PROTONIX) 40 MG tablet TAKE 1 TABLET EVERY DAY 90 tablet 3 12/01/2019 at 0630  . pravastatin (PRAVACHOL) 40 MG tablet TAKE 1 TABLET (40 MG TOTAL) BY MOUTH AT BEDTIME. 90 tablet 4 Past Week at Unknown time  . predniSONE (DELTASONE) 10 MG tablet Take 3 tablets for 3 days, then 2 tablets for 3 days, then 1 tablet for 3 days, then stop (Patient taking differently: Take 30 mg by mouth daily. FOR 7 DAYS (sTARTED 11/28/19)) 18 tablet 0 12/01/2019 at 0630  . roflumilast (DALIRESP) 500 MCG TABS tablet Take 500 mcg by mouth daily.   12/01/2019 at 0630  . tiotropium (SPIRIVA) 18 MCG inhalation capsule Place 18 mcg into inhaler and inhale daily.   12/01/2019 at 0630  . traMADol (ULTRAM) 50 MG tablet TAKE 1 TABLET BY MOUTH EVERY 8 HOURS (Patient taking differently: Take 50 mg by mouth every 8 (eight) hours as needed. ) 30 tablet 5 PRN at PRN  . citalopram (CELEXA) 20 MG tablet TAKE 1  TABLET EVERY DAY   Not Taking at 0630  . meloxicam (MOBIC) 15 MG tablet TAKE ONE TABLET BY MOUTH EVERY DAY (Patient not taking: Reported on 12/02/2019) 30 tablet 5 Not Taking at Unknown time  . OXYGEN Inhale into the lungs Nightly.      Scheduled: . amLODipine  2.5 mg Oral Daily  . aspirin  324 mg Oral Once  . aspirin EC  81 mg  Oral Daily  . clopidogrel  75 mg Oral Daily  . enoxaparin (LOVENOX) injection  40 mg Subcutaneous Q24H  . furosemide  20 mg Oral Daily  . gabapentin  100 mg Oral BID  . metoprolol succinate  25 mg Oral Daily  . multivitamin-lutein  1 capsule Oral BID  . pantoprazole  40 mg Oral Daily  . pravastatin  40 mg Oral QHS  . roflumilast  500 mcg Oral Daily  . tiotropium  18 mcg Inhalation Daily    ROS: History obtained from the patient  General ROS: negative for - chills, fatigue, fever, night sweats, weight gain or weight loss Psychological ROS: hallucinations Ophthalmic ROS: as noted in HPI ENT ROS: negative for - epistaxis, nasal discharge, oral lesions, sore throat, tinnitus or vertigo Allergy and Immunology ROS: negative for - hives or itchy/watery eyes Hematological and Lymphatic ROS: negative for - bleeding problems, bruising or swollen lymph nodes Endocrine ROS: negative for - galactorrhea, hair pattern changes, polydipsia/polyuria or temperature intolerance Respiratory ROS: negative for - cough, hemoptysis, shortness of breath or wheezing Cardiovascular ROS: negative for - chest pain, dyspnea on exertion, edema or irregular heartbeat Gastrointestinal ROS: negative for - abdominal pain, diarrhea, hematemesis, nausea/vomiting or stool incontinence Genito-Urinary ROS: negative for - dysuria, hematuria, incontinence or urinary frequency/urgency Musculoskeletal ROS: negative for - joint swelling or muscular weakness Neurological ROS: as noted in HPI Dermatological ROS: negative for rash and skin lesion changes  Physical Examination: Blood pressure (!) 156/69,  pulse 70, temperature 98 F (36.7 C), temperature source Oral, resp. rate 18, height '5\' 1"'$  (1.549 m), weight 76.7 kg, SpO2 98 %.  HEENT-  Normocephalic, no lesions, without obvious abnormality.  Normal external eye and conjunctiva.  Normal TM's bilaterally.  Normal auditory canals and external ears. Normal external nose, mucus membranes and septum.  Normal pharynx. Cardiovascular- S1, S2 normal, pulses palpable throughout   Lungs- chest clear, no wheezing, rales, normal symmetric air entry Abdomen- soft, non-tender; bowel sounds normal; no masses,  no organomegaly Extremities- BLE edema Lymph-no adenopathy palpable Musculoskeletal-no joint tenderness, deformity or swelling Skin-warm and dry, no hyperpigmentation, vitiligo, or suspicious lesions  Neurological Examination   Mental Status: Alert, oriented to person, place and time but tangential and easily distracted.  Speech fluent without evidence of aphasia.  Needs hand-over-hand reinforcement for 3-step commands. Cranial Nerves: II: Patient able to count fingers in all visual fields with both eyes open.   III,IV, VI: ptosis not present, extra-ocular motions intact bilaterally V,VII: smile symmetric, facial light touch sensation normal bilaterally VIII: hearing normal bilaterally IX,X: gag reflex present XI: bilateral shoulder shrug XII: midline tongue extension Motor: Right : Upper extremity   5/5    Left:     Upper extremity   5/5  Lower extremity   4-/5     Lower extremity   4-/5 Tone and bulk:normal tone throughout; no atrophy noted Sensory: Pinprick and light touch intact throughout, bilaterally Deep Tendon Reflexes: Symmetric throughout Plantars: Right: mute   Left: mute Cerebellar: Normal finger-to-nose and normal heel-to-shin testing bilaterally Gait: not tested due to safety concerns  Laboratory Studies:   Basic Metabolic Panel: Recent Labs  Lab 12/01/19 1950  NA 143  K 3.6  CL 96*  CO2 35*  GLUCOSE 100*  BUN 24*   CREATININE 0.91  CALCIUM 9.7    Liver Function Tests: Recent Labs  Lab 12/01/19 1950  AST 27  ALT 35  ALKPHOS 55  BILITOT 0.7  PROT 7.1  ALBUMIN 3.6  No results for input(s): LIPASE, AMYLASE in the last 168 hours. No results for input(s): AMMONIA in the last 168 hours.  CBC: Recent Labs  Lab 12/01/19 1950  WBC 15.3*  NEUTROABS 11.8*  HGB 14.9  HCT 45.2  MCV 89.2  PLT 309    Cardiac Enzymes: No results for input(s): CKTOTAL, CKMB, CKMBINDEX, TROPONINI in the last 168 hours.  BNP: Invalid input(s): POCBNP  CBG: No results for input(s): GLUCAP in the last 168 hours.  Microbiology: Results for orders placed or performed during the hospital encounter of 12/01/19  Respiratory Panel by RT PCR (Flu A&B, Covid) - Nasopharyngeal Swab     Status: None   Collection Time: 12/02/19 12:19 AM   Specimen: Nasopharyngeal Swab  Result Value Ref Range Status   SARS Coronavirus 2 by RT PCR NEGATIVE NEGATIVE Final    Comment: (NOTE) SARS-CoV-2 target nucleic acids are NOT DETECTED. The SARS-CoV-2 RNA is generally detectable in upper respiratoy specimens during the acute phase of infection. The lowest concentration of SARS-CoV-2 viral copies this assay can detect is 131 copies/mL. A negative result does not preclude SARS-Cov-2 infection and should not be used as the sole basis for treatment or other patient management decisions. A negative result may occur with  improper specimen collection/handling, submission of specimen other than nasopharyngeal swab, presence of viral mutation(s) within the areas targeted by this assay, and inadequate number of viral copies (<131 copies/mL). A negative result must be combined with clinical observations, patient history, and epidemiological information. The expected result is Negative. Fact Sheet for Patients:  PinkCheek.be Fact Sheet for Healthcare Providers:  GravelBags.it This  test is not yet ap proved or cleared by the [place] FDA and  has been authorized for detection and/or diagnosis of SARS-CoV-2 by FDA under an Emergency Use Authorization (EUA). This EUA will remain  in effect (meaning this test can be used) for the duration of the COVID-19 declaration under Section 564(b)(1) of the Act, 21 U.S.C. section 360bbb-3(b)(1), unless the authorization is terminated or revoked sooner.    Influenza A by PCR NEGATIVE NEGATIVE Final   Influenza B by PCR NEGATIVE NEGATIVE Final    Comment: (NOTE) The Xpert Xpress SARS-CoV-2/FLU/RSV assay is intended as an aid in  the diagnosis of influenza from Nasopharyngeal swab specimens and  should not be used as a sole basis for treatment. Nasal washings and  aspirates are unacceptable for Xpert Xpress SARS-CoV-2/FLU/RSV  testing. Fact Sheet for Patients: PinkCheek.be Fact Sheet for Healthcare Providers: GravelBags.it This test is not yet approved or cleared by the [place] FDA and  has been authorized for detection and/or diagnosis of SARS-CoV-2 by  FDA under an Emergency Use Authorization (EUA). This EUA will remain  in effect (meaning this test can be used) for the duration of the  Covid-19 declaration under Section 564(b)(1) of the Act, 21  U.S.C. section 360bbb-3(b)(1), unless the authorization is  terminated or revoked. Performed at [facility], [address] [place], [place] [address]     Coagulation Studies: Recent Labs    12/01/19 1950  LABPROT 12.6  INR 1.0    Urinalysis:  Recent Labs  Lab 12/01/19 2027  COLORURINE YELLOW*  LABSPEC 1.018  PHURINE 5.0  GLUCOSEU NEGATIVE  HGBUR NEGATIVE  BILIRUBINUR NEGATIVE  KETONESUR NEGATIVE  PROTEINUR NEGATIVE  NITRITE NEGATIVE  LEUKOCYTESUR TRACE*    Lipid Panel:     Component Value Date/Time   CHOL 179 12/02/2019 0544   CHOL 144 04/23/2019 1007  TRIG 130 12/02/2019  0544   HDL 54 12/02/2019 0544   HDL 41 04/23/2019 1007   CHOLHDL 3.3 12/02/2019 0544   VLDL 26 12/02/2019 0544   LDLCALC 99 12/02/2019 0544   LDLCALC 79 04/23/2019 1007    HgbA1C:  Lab Results  Component Value Date   HGBA1C 6.0 (H) 12/02/2019    Urine Drug Screen:  No results found for: LABOPIA, COCAINSCRNUR, LABBENZ, AMPHETMU, THCU, LABBARB  Alcohol Level: No results for input(s): ETH in the last 168 hours.  Other results: EKG: normal sinus rhythm at 69 bpm.  Imaging: CT HEAD WO CONTRAST  Result Date: 12/01/2019 CLINICAL DATA:  Slurred speech x2 days. EXAM: CT HEAD WITHOUT CONTRAST TECHNIQUE: Contiguous axial images were obtained from the base of the skull through the vertex without intravenous contrast. COMPARISON:  Dec 19, 2017 FINDINGS: Brain: There is mild cerebral atrophy with widening of the extra-axial spaces and ventricular dilatation. There are areas of decreased attenuation within the white matter tracts of the supratentorial brain, consistent with microvascular disease changes. Small chronic bilateral basal ganglia lacunar infarcts are seen. 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 Skull: Normal. Negative for fracture or focal lesion. Sinuses/Orbits: No acute finding. Other: None. IMPRESSION: 1. Generalized cerebral atrophy. 2. No acute intracranial abnormality. 3. 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 4. Small chronic bilateral basal ganglia lacunar infarcts. Electronically Signed   By: Virgina Norfolk M.D.   On: 12/01/2019 20:16   MR BRAIN WO CONTRAST  Result Date: 12/02/2019 CLINICAL DATA:  Neuro deficit, acute, stroke suspected. Additional history provided: Acute onset slurred speech, headache, mild nausea without vomiting EXAM: MRI HEAD WITHOUT CONTRAST TECHNIQUE: Multiplanar, multiecho pulse sequences  of the brain and surrounding structures were obtained without intravenous contrast. COMPARISON:  Noncontrast head CT 12/01/2019, noncontrast head CT 12/19/2017, brain MRI 08/26/2013, CT angiogram head 08/19/2013 FINDINGS: Brain: Multiple sequences are significantly motion degraded. Most notably there is moderate motion degradation of the axial and coronal diffusion-weighted sequence, moderate motion degradation of the axial SWI sequence, moderate motion degradation of the axial T1 weighted sequence, moderate/severe motion degradation of the axial T2 FLAIR sequence and moderate/severe motion degradation of the coronal T2 weighted sequence. Moderate to advanced patchy and confluent T2/FLAIR hyperintensity within the cerebral white matter is nonspecific, but consistent with chronic small vessel ischemic disease. Findings have progressed as compared to prior MRI 08/26/2013. Redemonstrated chronic lacunar infarcts within the bilateral basal ganglia. Also again seen, there are chronic small-vessel ischemic changes within the thalami and pons. Chronic microhemorrhages within the right frontal lobe, left periatrial region and left cerebellum not definitively present on a prior MRI. Stable, mild generalized parenchymal atrophy. There is no acute infarct. No evidence of intracranial mass. No extra-axial fluid collection. No midline shift. Vascular: Expected proximal arterial flow voids. 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 Skull and upper cervical spine: No focal marrow lesion Sinuses/Orbits: Visualized orbits show no acute finding. Mild ethmoid and right maxillary sinus mucosal thickening. No significant mastoid effusion. IMPRESSION: 1. Motion degraded examination as described. 2. No evidence of acute intracranial abnormality, including acute infarction. 3. Moderate to advanced chronic small vessel ischemic disease has progressed as compared  to prior MRI 08/26/2013. Redemonstrated chronic lacunar infarcts within the bilateral basal ganglia. 4. Nonspecific chronic microhemorrhages within the right frontal  lobe, left periatrial region and left cerebellum which were not definitively present on the prior MRI. 5. Stable, mild generalized parenchymal atrophy. 6. 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 Electronically Signed   By: Kellie Simmering DO   On: 12/02/2019 08:26   US Carotid Bilateral (at [facility] and [facility] only)  Result Date: 12/02/2019 CLINICAL DATA:  84 year old female with TIA EXAM: BILATERAL CAROTID DUPLEX ULTRASOUND TECHNIQUE: Pearline Cables scale imaging, color Doppler and duplex ultrasound were performed of bilateral carotid and vertebral arteries in the neck. COMPARISON:  None. FINDINGS: Criteria: Quantification of carotid stenosis is based on velocity parameters that correlate the residual internal carotid diameter with NASCET-based stenosis levels, using the diameter of the distal internal carotid lumen as the denominator for stenosis measurement. The following velocity measurements were obtained: RIGHT ICA:  Systolic 528 cm/sec, Diastolic 21 cm/sec CCA:  69 cm/sec SYSTOLIC ICA/CCA RATIO:  2.0 ECA:  120 cm/sec LEFT ICA:  Systolic 413 cm/sec, Diastolic 21 cm/sec CCA:  75 cm/sec SYSTOLIC ICA/CCA RATIO:  3.8 ECA:  1 4 cm/sec Right Brachial SBP: Not acquired Left Brachial SBP: Not acquired RIGHT CAROTID ARTERY: No significant calcifications of the right common carotid artery. Intermediate waveform maintained. Moderate heterogeneous and partially calcified plaque at the right carotid bifurcation. No significant lumen shadowing. Low resistance waveform of the right ICA. Tortuosity RIGHT VERTEBRAL ARTERY: Antegrade flow with low resistance waveform. LEFT CAROTID ARTERY: No significant calcifications of the left common carotid artery. Intermediate waveform maintained. Moderate heterogeneous  and partially calcified plaque at the left carotid bifurcation. No significant lumen shadowing. Low resistance waveform of the left ICA. Tortuosity LEFT VERTEBRAL ARTERY:  Antegrade flow with low resistance waveform. IMPRESSION: Right: Color duplex indicates moderate heterogeneous and calcified plaque, with no hemodynamically significant stenosis by duplex criteria in the extracranial cerebrovascular circulation. Left: Heterogeneous and partially calcified plaque at the left carotid bifurcation, with discordant results regarding degree of stenosis by established duplex criteria. Peak velocity suggests 70% - 99% stenosis, with the ICA/ CCA ratio suggesting a lesser degree of stenosis. If establishing a more accurate degree of stenosis is required, cerebral angiogram should be considered, or as a second best test, CTA. Signed, Dulcy Fanny. Dellia Nims, RPVI [facility] Radiology Electronically Signed   By: Corrie Mckusick D.O.   On: 12/02/2019 11:33   DG Chest Portable 1 View  Result Date: 12/02/2019 CLINICAL DATA:  Slurred speech x2 days. EXAM: PORTABLE CHEST 1 VIEW COMPARISON:  January 26, 2014 FINDINGS: Mild diffuse chronic appearing increased lung markings are seen. There is no evidence of acute infiltrate, pleural effusion or pneumothorax. The cardiac silhouette is moderately enlarged. There is marked severity calcification of the aortic arch. Multilevel degenerative changes seen throughout the thoracic spine. IMPRESSION: Chronic-appearing increased lung markings without evidence of acute or active cardiopulmonary disease. Electronically Signed   By: Virgina Norfolk M.D.   On: 12/02/2019 00:33    Assessment/Plan: 84 y.o. female with a history of COPD, HLD, HTN who reports that for the past 3-4 days she has had slurred speech and hallucinations consisting of numbers.  Per further history patient appears to have had a chronic progression of both physical and  cognitive decline at least over the past year and likely longer.  MRI of the brain personally reviewed and shows no acute changes but does show extensive small vessel ischemic changes and evidence of chronic microhemorrhages.  These changes have  been progressive over time when compared to old imaging.  Echocardiogram is pending.  Carotid dopplers show possible 47-84% LICA stenosis.  A1c 6.0.  LDL 99. It is unlikely that current presentation is secondary to an acute infarct.  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  Based on current home situation per patient unclear if patient taking medications correctly as well.    Recommendations: 1. Would discontinue Ultram 2. Would discontinue Neurontin 3. Decrease Celexa to '10mg'$  daily 4. TSH, B12, folate, ESR, RPR 5. CTA of head and neck to further investigate left ICA stenosis 6. Follow up on an outpatient basis for basilar aneurysm. 7. Continue ASA daily.  If significant stenosis documented on CTA would add Plavix at '75mg'$  daily as well 8. Aggressive lipid management with target LDL<70. 9. BP control with target BP<140/80 10. Echocardiogram pending  Alexis Goodell, MD Neurology [phone] 12/02/2019, 12:37 PM

## 2019-12-02 NOTE — Evaluation (Signed)
Physical Therapy Evaluation Patient Details Name: Toni White MRN: [id number] DOB: Aug 26, 1935 Today's Date: 12/02/2019   History of Present Illness  presented to ER secondary to acute onset of slurred speech, HA, nausea; admitted for TIA/CVA work up. MRI negative for acute infarct.  Clinical Impression  Upon evaluation, patient alert and oriented to basic information; follows commands, but with noted difficulty articulating and comprehending more indepth information.  Does endorse some degree of visual deficit; unable to fully articulate acuteness of deficits or fully describe depth of deficits (OT to assess further).  Bilat UE/LE strength and ROM grossly symmetrical and WFL for basic transfers and gait; no focal weakness, sensory or coordination deficit appreciated.  Able to complete sit/stand, basic transfers and gait (20') with RW, cga/min assist.   Demonstrates forward flexed posture, choppy stepping pattern with limited balance reactions; do recommend continued use of RW at all times with mobility.  Would benefit from skilled PT to address above deficits and promote optimal return to PLOF.; Recommend transition to HHPT upon discharge from acute hospitalization.     Follow Up Recommendations Home health PT    Equipment Recommendations  Rolling walker with 5" wheels;3in1 (PT)    Recommendations for Other Services       Precautions / Restrictions Precautions Precautions: Fall Restrictions Weight Bearing Restrictions: No      Mobility  Bed Mobility               General bed mobility comments: toileting with CNA upon arrival to room; in recliner end of session  Transfers Overall transfer level: Needs assistance Equipment used: Rolling walker (2 wheeled) Transfers: Sit to/from Stand Sit to Stand: Min assist;Min guard         General transfer comment: heavy use of UEs to assist with lift off and stabilization  Ambulation/Gait Ambulation/Gait assistance: Min  assist;Min guard Gait Distance (Feet): 20 Feet Assistive device: Rolling walker (2 wheeled)       General Gait Details: forward flexed posture, choppy stepping pattern with limited balance reactions; do recommend continued use of RW  Stairs            Wheelchair Mobility    Modified Rankin (Stroke Patients Only)       Balance Overall balance assessment: Needs assistance Sitting-balance support: No upper extremity supported;Feet supported Sitting balance-Leahy Scale: Good     Standing balance support: Bilateral upper extremity supported Standing balance-Leahy Scale: Fair                               Pertinent Vitals/Pain Pain Assessment: No/denies pain    Home Living Family/patient expects to be discharged to:: Private residence Living Arrangements: (female partner who is WC bound) Available Help at Discharge: Friend(s);Available PRN/intermittently Type of Home: House Home Access: Ramped entrance     Home Layout: One level        Prior Function Level of Independence: Independent with assistive device(s)         Comments: Mod indep with ADLs and household mobilization; should use RW, but chooses to 'furniture walk' instead.  Does endorse fall history, but unable to fully quantify     Hand Dominance   Dominant Hand: Right    Extremity/Trunk Assessment   Upper Extremity Assessment Upper Extremity Assessment: (grossly at least 4/5 throughout; no focal weakness, sensory deficit appreciated.  Baseline tremors noted bilat)    Lower Extremity Assessment Lower Extremity Assessment: (grossly at least 4/5 throughout;  no focal weakness, sensory deficit appreciated.  Baseline tremors noted bilat)       Communication   Communication: No difficulties  Cognition Arousal/Alertness: Awake/alert Behavior During Therapy: WFL for tasks assessed/performed Overall Cognitive Status: Within Functional Limits for tasks assessed                                         General Comments      Exercises Other Exercises Other Exercises: Toilet transfer, sit/stand with grab bars, cga/min assist; dep for hygiene to ensure cleanliness. Other Exercises: Standing balance at sink for hand hygiene, cga/min assist for balance; does require UE support to maintain balance and extend reach as needed.  Noted difficulty with visual awareness/recognition of environment.  Patient with noted difficulty articulating visual deficits; difficulty discerning acuteness of deficits and true scope of deficits   Assessment/Plan    PT Assessment Patient needs continued PT services  PT Problem List Decreased activity tolerance;Decreased balance;Decreased mobility;Decreased knowledge of use of DME;Decreased safety awareness;Decreased knowledge of precautions       PT Treatment Interventions DME instruction;Gait training;Functional mobility training;Therapeutic activities;Therapeutic exercise;Balance training;Patient/family education    PT Goals (Current goals can be found in the Care Plan section)  Acute Rehab PT Goals Patient Stated Goal: to return home PT Goal Formulation: With patient Time For Goal Achievement: 12/16/19 Potential to Achieve Goals: Fair    Frequency Min 2X/week   Barriers to discharge Decreased caregiver support      Co-evaluation               AM-PAC PT "6 Clicks" Mobility  Outcome Measure Help needed turning from your back to your side while in a flat bed without using bedrails?: None Help needed moving from lying on your back to sitting on the side of a flat bed without using bedrails?: None Help needed moving to and from a bed to a chair (including a wheelchair)?: A Little Help needed standing up from a chair using your arms (e.g., wheelchair or bedside chair)?: A Little Help needed to walk in hospital room?: A Little Help needed climbing 3-5 steps with a railing? : A Little 6 Click Score: 20    End of Session  Equipment Utilized During Treatment: Gait belt Activity Tolerance: Patient tolerated treatment well Patient left: in chair;with call bell/phone within reach;with chair alarm set Nurse Communication: Mobility status PT Visit Diagnosis: Muscle weakness (generalized) (M62.81);Difficulty in walking, not elsewhere classified (R26.2)    Time: [id number] PT Time Calculation (min) (ACUTE ONLY): 26 min   Charges:   PT Evaluation $PT Eval Moderate Complexity: 1 Mod PT Treatments $Therapeutic Activity: 8-22 mins        Thorn Demas H. Owens Shark, PT, DPT, NCS 12/02/19, 1:38 PM [phone]

## 2019-12-03 ENCOUNTER — Other Ambulatory Visit (INDEPENDENT_AMBULATORY_CARE_PROVIDER_SITE_OTHER): Payer: Self-pay | Admitting: Vascular Surgery

## 2019-12-03 DIAGNOSIS — I10 Essential (primary) hypertension: Secondary | ICD-10-CM

## 2019-12-03 DIAGNOSIS — E785 Hyperlipidemia, unspecified: Secondary | ICD-10-CM

## 2019-12-03 DIAGNOSIS — I6522 Occlusion and stenosis of left carotid artery: Secondary | ICD-10-CM

## 2019-12-03 DIAGNOSIS — R4701 Aphasia: Secondary | ICD-10-CM

## 2019-12-03 LAB — RPR: RPR Ser Ql: NONREACTIVE

## 2019-12-03 LAB — ECHOCARDIOGRAM COMPLETE
Height: 61 in
Weight: 2704 oz

## 2019-12-03 MED ORDER — SODIUM CHLORIDE 0.9 % IV SOLN: INTRAVENOUS | Status: DC

## 2019-12-03 MED ORDER — MIDAZOLAM HCL 2 MG/ML PO SYRP: 8.0000 mg | ORAL_SOLUTION | Freq: Once | ORAL | Status: DC | PRN

## 2019-12-03 MED ORDER — ALUM & MAG HYDROXIDE-SIMETH 200-200-20 MG/5ML PO SUSP
30.0000 mL | Freq: Four times a day (QID) | ORAL | Status: DC | PRN
  Administered 2019-12-03: 21:00:00 30 mL via ORAL
  Filled 2019-12-03: qty 30

## 2019-12-03 MED ORDER — HYDROMORPHONE HCL 1 MG/ML IJ SOLN: 1.0000 mg | Freq: Once | INTRAMUSCULAR | Status: DC | PRN

## 2019-12-03 MED ORDER — LEVOTHYROXINE SODIUM 25 MCG PO TABS
37.5000 ug | ORAL_TABLET | Freq: Every day | ORAL | Status: DC
  Administered 2019-12-03 – 2019-12-06 (×3): 37.5 ug via ORAL
  Filled 2019-12-03 (×3): qty 2

## 2019-12-03 MED ORDER — ONDANSETRON HCL 4 MG/2ML IJ SOLN: 4.0000 mg | Freq: Four times a day (QID) | INTRAMUSCULAR | Status: DC | PRN

## 2019-12-03 MED ORDER — CEFAZOLIN SODIUM-DEXTROSE 2-4 GM/100ML-% IV SOLN
2.0000 g | Freq: Once | INTRAVENOUS | Status: AC
  Administered 2019-12-04: 08:00:00 2 g via INTRAVENOUS

## 2019-12-03 MED ORDER — DIPHENHYDRAMINE HCL 50 MG/ML IJ SOLN: 50.0000 mg | Freq: Once | INTRAMUSCULAR | Status: DC | PRN

## 2019-12-03 MED ORDER — METHYLPREDNISOLONE SODIUM SUCC 125 MG IJ SOLR: 125.0000 mg | Freq: Once | INTRAMUSCULAR | Status: DC | PRN

## 2019-12-03 MED ORDER — FAMOTIDINE 20 MG PO TABS: 40.0000 mg | ORAL_TABLET | Freq: Once | ORAL | Status: DC | PRN

## 2019-12-03 NOTE — Evaluation (Signed)
Occupational Therapy Evaluation Patient Details Name: Toni White MRN: [id number] DOB: 18-Jul-1936 Today's Date: 12/03/2019    History of Present Illness Harpreet Nur is a 84 y/o F who presented to ER secondary to acute onset of slurred speech, HA, nausea; admitted for TIA/CVA work up. MRI negative for acute infarct.   Clinical Impression   Pt was seen for OT evaluation this date. Prior to hospital admission, pt was MOD I with fxl mobility ("furniture walking") and Indep with most BADLs per her self-report. Pt lives in [facility] with ramped entrance. Currently pt demonstrates impairments as described below (See OT problem list) which functionally limit her ability to perform ADL/self-care tasks. Pt currently requires CGA/MIN A for ADL transfers CGA to SBA for standing self care with RW sink-side, and CGA to MIN A for seated LB ADLs d/t F- dyanmic sitting balance.  Pt would benefit from skilled OT to address noted impairments and functional limitations (see below for any additional details) in order to maximize safety and independence while minimizing falls risk and caregiver burden. Upon hospital discharge, recommend HHOT to maximize pt safety and return to functional independence during meaningful occupations of daily life. 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 Pt reports after administration that she has recently been dx with macular degeneration in L eye. Clock draw unable to be completed d/t low vision. Results from MMSE are as follows: Orientation 6/10, Registration: 2/3, Attention and Calculation 0/5, Recall 2/3, Language portion initiated, but unable to be completed d/t pt's low vision (despite donning her glasses).     Follow Up Recommendations  Home health OT;Supervision - Intermittent    Equipment Recommendations  3 in 1 bedside commode    Recommendations for Other Services        Precautions / Restrictions Precautions Precautions: Fall Restrictions Weight Bearing Restrictions: No      Mobility Bed Mobility Overal bed mobility: Needs Assistance Bed Mobility: Supine to Sit     Supine to sit: Min guard;HOB elevated        Transfers Overall transfer level: Needs assistance Equipment used: Rolling walker (2 wheeled) Transfers: Sit to/from Stand Sit to Stand: Min assist;Min guard         General transfer comment: Cues for safe hand placement (push from bed rather than pull up on RW)    Balance Overall balance assessment: Needs assistance Sitting-balance support: No upper extremity supported;Feet supported Sitting balance-Leahy Scale: Good     Standing balance support: Bilateral upper extremity supported Standing balance-Leahy Scale: Fair                             ADL either performed or assessed with clinical judgement   ADL                                         General ADL Comments: Pt requries CGA to SBA with RW for ADL trasnfers and fxl mobility on 2Lnc. Able to perform standing hygiene sink-side with RW with CGA. Seated, able to perform LB dressing with CGA/MIN A d/t F- dynamic sitting balance.     Vision Baseline Vision/History: Macular Degeneration;Wears glasses(L eye) Wears Glasses: At all times Additional Comments: Blind in R eye at baseline. Tracks mostly appropriately, appears to lose  some visual stimuli in R hemisphere. Difficulty with clock draw/name writing d/t low vision.     Perception     Praxis      Pertinent Vitals/Pain Pain Assessment: No/denies pain     Hand Dominance Right   Extremity/Trunk Assessment Upper Extremity Assessment Upper Extremity Assessment: Overall WFL for tasks assessed;Generalized weakness(MMT grossly 4-/5 bilaterally. Pt with some intermittent tremors noted in UEs which pt reports are present at baseline.)   Lower Extremity Assessment Lower Extremity  Assessment: Defer to PT evaluation;Overall WFL for tasks assessed       Communication Communication Communication: No difficulties   Cognition Arousal/Alertness: Awake/alert Behavior During Therapy: WFL for tasks assessed/performed Overall Cognitive Status: No family/caregiver present to determine baseline cognitive functioning                                 General Comments: Pt appropriate converstaionally. Pt oreinted to year season and month (says "April or May, but I think it's still April"), place: town, state, country, hospital. MMSE administration attempted, writing portions unable to be scored d/t pt with low vision and some intermittent UE tremors. Pt scores 2/3 on recall, 3/5 on both portions of orientation, 0/5 on attention/calculation, and language/copying portion unable to be fully assessed d/t aforementioned reasons-thus skewing score.   General Comments       Exercises Other Exercises Other Exercises: OT facilitates education re: role of OT. Pt with moderate understanding demo'ed. Other Exercises: OT attempts to administer MMSE, d/t pt's low vision, copying/language portion unable to be completed. Pt scores as follows: Orientation 6/10, Registration: 2/3, Attention and Calculation 0/5, Recall 2/3, Language portion initiated, but unable to be completed d/t pt's low vision (despite donning her glasses).   Shoulder Instructions      Home Living Family/patient expects to be discharged to:: Private residence Living Arrangements: (female partner is w/c bound) Available Help at Discharge: Friend(s);Available PRN/intermittently(dtr involved in care-POA) Type of Home: House Home Access: Ramped entrance     Home Layout: One level               Home Equipment: Walker - 2 wheels   Additional Comments: somewhat ?? historian, describes walker like the one in hospital room and appears familiar with use.  Lives With: Friend(s)    Prior Functioning/Environment  Level of Independence: Independent with assistive device(s)        Comments: Mod indep with ADLs and household mobilization; should use RW, but chooses to 'furniture walk' instead.  Does endorse fall history, but unable to fully quantify        OT Problem List: Decreased strength;Decreased activity tolerance;Impaired balance (sitting and/or standing);Decreased cognition;Impaired vision/perception;Cardiopulmonary status limiting activity      OT Treatment/Interventions: Self-care/ADL training;Therapeutic exercise;Energy conservation;DME and/or AE instruction;Therapeutic activities;Patient/family education;Balance training    OT Goals(Current goals can be found in the care plan section) Acute Rehab OT Goals Patient Stated Goal: to return home OT Goal Formulation: With patient Time For Goal Achievement: 12/17/19 Potential to Achieve Goals: Good  OT Frequency: Min 2X/week   Barriers to D/C:            Co-evaluation              AM-PAC OT "6 Clicks" Daily Activity     Outcome Measure Help from another person eating meals?: None Help from another person taking care of personal grooming?: A Little Help from another person toileting, which includes using  toliet, bedpan, or urinal?: A Little Help from another person bathing (including washing, rinsing, drying)?: A Little Help from another person to put on and taking off regular upper body clothing?: None Help from another person to put on and taking off regular lower body clothing?: A Little 6 Click Score: 20   End of Session Equipment Utilized During Treatment: Gait belt;Rolling walker Nurse Communication: Mobility status;Other (comment)(mental status.)  Activity Tolerance: Patient tolerated treatment well Patient left: in chair;with chair alarm set;with nursing/sitter in room(nurse present to administer meds, will move table and call bell closer to pt after.)  OT Visit Diagnosis: Unsteadiness on feet (R26.81);Muscle weakness  (generalized) (M62.81)                Time: [id number] OT Time Calculation (min): 38 min Charges:  OT General Charges $OT Visit: 1 Visit OT Evaluation $OT Eval Moderate Complexity: 1 Mod OT Treatments $Self Care/Home Management : 8-22 mins $Therapeutic Activity: 8-22 mins  Gerrianne Scale, MS, OTR/L ascom [phone] 12/03/19, 10:28 AM

## 2019-12-03 NOTE — Progress Notes (Signed)
PT Cancellation Note  Patient Details Name: TANISE DELORBE MRN: [id number] DOB: [birth year]   Cancelled Treatment:    Reason Eval/Treat Not Completed: Patient declined, no reason specified. RN in room and family member who state patient is not feeling well right now. RN states she was getting a bath and became lethargic. Will hold for today and re-attempt at later time.    Makailah Slavick 12/03/2019, 3:50 PM

## 2019-12-03 NOTE — Progress Notes (Signed)
[facility] Cardiology  Patient Description: Toni White is a 84 year old female with PMH significant for severe COPD on chronic supplemental O2 at 2L via [place], HTN, dyslipidemia, DM Type II, TIA and basilar aneurysm who was admitted for TIA and found to have 80% stenosis of the left ICA.   SUBJECTIVE: The patient reports to be feeling much better on today and denies any cardiac complaints at this time. She reports that the slurred speech has almost completely resolved and she denies any further stroke-like symptoms at this time. The patient states that her dyspnea remains at her baseline, but she has not gotten out of the bed today and would like to do so.   OBJECTIVE: The patient appears to be much improvement since yesterday. She has normal effort of breathing and is able to maintain her oxygen saturations on her normal chronic oxygen therapy at 2L via [place]. She is AOx4, her cranial nerves II-XI are grossly intact and there was not any slurred speech upon examination today.   Vitals:   12/02/19 1601 12/02/19 2057 12/02/19 2333 12/03/19 0734  BP: (!) 155/71 (!) 162/77 (!) 162/76 135/69  Pulse: 76 76 81 86  Resp: 20 18 18    Temp: 98.4 F (36.9 C) 98.8 F (37.1 C) 98.2 F (36.8 C) 98.1 F (36.7 C)  TempSrc: Oral Oral Oral Oral  SpO2: 100% 98% 97% 99%  Weight:      Height:        No intake or output data in the 24 hours ending 12/03/19 1323    PHYSICAL EXAM  General: Well developed, well nourished, in no acute distress HEENT:  Normocephalic and atramatic Neck:  No JVD. Supple, +2 carotid with bruit  Lungs: expiratory wheezes bilaterally to auscultation Heart: HRRR . Normal S1 and S2 without gallops or murmurs.  Abdomen: Bowel sounds are positive, abdomen soft and non-tender  Msk:  Normal strength and tone for age. Extremities: moderate non-pitting BLE edema. No clubbing or cyanosis   Neuro: Alert and oriented X 3. Psych:  Good affect, responds appropriately   LABS: Basic Metabolic  Panel: Recent Labs    12/01/19 1950  NA 143  K 3.6  CL 96*  CO2 35*  GLUCOSE 100*  BUN 24*  CREATININE 0.91  CALCIUM 9.7   Liver Function Tests: Recent Labs    12/01/19 1950  AST 27  ALT 35  ALKPHOS 55  BILITOT 0.7  PROT 7.1  ALBUMIN 3.6   No results for input(s): LIPASE, AMYLASE in the last 72 hours. CBC: Recent Labs    12/01/19 1950  WBC 15.3*  NEUTROABS 11.8*  HGB 14.9  HCT 45.2  MCV 89.2  PLT 309   Cardiac Enzymes: No results for input(s): CKTOTAL, CKMB, CKMBINDEX, TROPONINI in the last 72 hours. BNP: Invalid input(s): POCBNP D-Dimer: No results for input(s): DDIMER in the last 72 hours. Hemoglobin A1C: Recent Labs    12/02/19 0544  HGBA1C 6.0*   Fasting Lipid Panel: Recent Labs    12/02/19 0544  CHOL 179  HDL 54  LDLCALC 99  TRIG 130  CHOLHDL 3.3   Thyroid Function Tests: Recent Labs    12/02/19 1415  TSH 3.061   Anemia Panel: Recent Labs    12/02/19 1415  VITAMINB12 331  FOLATE 35.0    CT ANGIO HEAD W OR WO CONTRAST  Result Date: 12/02/2019 CLINICAL DATA:  Carotid artery stenosis. Recent slurred speech and mental status changes. Basilar tip aneurysm. Left carotid stenosis by ultrasound. EXAM: CT ANGIOGRAPHY  HEAD AND NECK TECHNIQUE: Multidetector CT imaging of the head and neck was performed using the standard protocol during bolus administration of intravenous contrast. Multiplanar CT image reconstructions and MIPs were obtained to evaluate the vascular anatomy. Carotid stenosis measurements (when applicable) are obtained utilizing NASCET criteria, using the distal internal carotid diameter as the denominator. CONTRAST:  43mL OMNIPAQUE IOHEXOL 350 MG/ML SOLN COMPARISON:  MRI same day. Ultrasound same day. MRI 08/26/2013. CT angiography 08/19/2013. FINDINGS: CT HEAD FINDINGS Brain: Brain atrophy with chronic small-vessel ischemic changes affecting the pons, thalami, basal ganglia and hemispheric Toni White matter. No sign of acute infarction,  mass lesion, hemorrhage, hydrocephalus or extra-axial collection. Vascular: There is atherosclerotic calcification of the major vessels at the base of the brain. 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 See below. Skull: Negative Sinuses: Clear sinuses. Orbits: Lens calcification on the right. Review of the MIP images confirms the above findings CTA NECK FINDINGS Aortic arch: Aortic atherosclerotic calcification. No aneurysm or dissection. Branching pattern is normal without flow limiting stenosis. Right carotid system: Common carotid artery widely patent to the bifurcation and ICA bulb. Somewhat irregular soft and calcified plaque affecting the ICA bulb with minimal diameter of 3.4 mm. Compared to a more distal cervical ICA diameter of 5 mm, this indicates a 30% stenosis. Vessels are tortuous, swinging to the midline behind the oropharynx. Left carotid system: Common carotid artery is patent to the bifurcation. Severe irregular calcified plaque at the carotid bifurcation and ICA bulb. Luminal stenosis likely 1 mm. Compared to a more distal cervical ICA diameter of 5 mm, this indicates an 80% stenosis. Vessels are tortuous, swinging to the midline behind the oropharynx. Vertebral arteries: Calcified plaque at both vertebral artery origin regions but without stenosis greater than 30%. Scattered areas of calcified plaque through the cervical course of both vertebral arteries but no stenosis greater than 30%. Skeleton: Degenerative cervical spondylosis. Other neck: No mass or lymphadenopathy. Upper chest: Emphysema and pulmonary scarring at the apices. Review of the MIP images confirms the above findings CTA HEAD FINDINGS Anterior circulation: Both internal carotid arteries are patent through the skull base and siphon regions. Ordinary siphon atherosclerotic calcification but without stenosis greater than 30%. Anterior and middle cerebral vessels are patent without large or medium vessel occlusion, aneurysm or vascular  malformation. Posterior circulation: Both vertebral arteries are patent through the foramen magnum. There is atherosclerotic disease at both vertebral artery V4 segments with stenosis estimated at 50% on both sides. Both vessels supply the basilar. No basilar stenosis. 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 Venous sinuses: Patent and normal. Anatomic variants: None significant. Review of the MIP images confirms the above findings IMPRESSION: No acute large or medium vessel occlusion. Aortic Atherosclerosis (ICD10-I70.0) and Emphysema (ICD10-J43.9). Atherosclerotic disease at both carotid bifurcation and ICA bulb regions. 30% ICA stenosis on the right. 80% or greater irregular stenosis of the ICA bulb on the left. Vessels are tortuous, swinging to the midline behind the oropharynx. Atherosclerotic disease of both vertebral artery V4 segments with stenoses estimated at 50%. 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 Electronically Signed   By: Nelson Chimes M.D.   On: 12/02/2019 15:56   CT HEAD WO CONTRAST  Result Date: 12/01/2019 CLINICAL DATA:  Slurred speech x2 days. EXAM: CT HEAD WITHOUT CONTRAST TECHNIQUE: Contiguous axial images were obtained from the base of the skull through the  vertex without intravenous contrast. COMPARISON:  Dec 19, 2017 FINDINGS: Brain: There is mild cerebral atrophy with widening of the extra-axial spaces and ventricular dilatation. There are areas of decreased attenuation within the Toni White matter tracts of the supratentorial brain, consistent with microvascular disease changes. Small chronic bilateral basal ganglia lacunar infarcts are seen. 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 Skull: Normal. Negative for fracture or focal lesion. Sinuses/Orbits: No acute finding. Other:  None. IMPRESSION: 1. Generalized cerebral atrophy. 2. No acute intracranial abnormality. 3. 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 4. Small chronic bilateral basal ganglia lacunar infarcts. Electronically Signed   By: Virgina Norfolk M.D.   On: 12/01/2019 20:16   CT ANGIO NECK W OR WO CONTRAST  Result Date: 12/02/2019 CLINICAL DATA:  Carotid artery stenosis. Recent slurred speech and mental status changes. Basilar tip aneurysm. Left carotid stenosis by ultrasound. EXAM: CT ANGIOGRAPHY HEAD AND NECK TECHNIQUE: Multidetector CT imaging of the head and neck was performed using the standard protocol during bolus administration of intravenous contrast. Multiplanar CT image reconstructions and MIPs were obtained to evaluate the vascular anatomy. Carotid stenosis measurements (when applicable) are obtained utilizing NASCET criteria, using the distal internal carotid diameter as the denominator. CONTRAST:  27mL OMNIPAQUE IOHEXOL 350 MG/ML SOLN COMPARISON:  MRI same day. Ultrasound same day. MRI 08/26/2013. CT angiography 08/19/2013. FINDINGS: CT HEAD FINDINGS Brain: Brain atrophy with chronic small-vessel ischemic changes affecting the pons, thalami, basal ganglia and hemispheric Toni White matter. No sign of acute infarction, mass lesion, hemorrhage, hydrocephalus or extra-axial collection. Vascular: There is atherosclerotic calcification of the major vessels at the base of the brain. 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 See below. Skull: Negative Sinuses: Clear sinuses. Orbits: Lens calcification on the right. Review of the MIP images confirms the above findings CTA NECK FINDINGS Aortic arch: Aortic atherosclerotic calcification. No aneurysm or dissection. Branching pattern is normal without flow limiting stenosis. Right carotid system: Common carotid artery widely patent to the bifurcation and ICA bulb. Somewhat irregular soft and calcified plaque  affecting the ICA bulb with minimal diameter of 3.4 mm. Compared to a more distal cervical ICA diameter of 5 mm, this indicates a 30% stenosis. Vessels are tortuous, swinging to the midline behind the oropharynx. Left carotid system: Common carotid artery is patent to the bifurcation. Severe irregular calcified plaque at the carotid bifurcation and ICA bulb. Luminal stenosis likely 1 mm. Compared to a more distal cervical ICA diameter of 5 mm, this indicates an 80% stenosis. Vessels are tortuous, swinging to the midline behind the oropharynx. Vertebral arteries: Calcified plaque at both vertebral artery origin regions but without stenosis greater than 30%. Scattered areas of calcified plaque through the cervical course of both vertebral arteries but no stenosis greater than 30%. Skeleton: Degenerative cervical spondylosis. Other neck: No mass or lymphadenopathy. Upper chest: Emphysema and pulmonary scarring at the apices. Review of the MIP images confirms the above findings CTA HEAD FINDINGS Anterior circulation: Both internal carotid arteries are patent through the skull base and siphon regions. Ordinary siphon atherosclerotic calcification but without stenosis greater than 30%. Anterior and middle cerebral vessels are patent without large or medium vessel occlusion, aneurysm or vascular malformation. Posterior circulation: Both vertebral arteries are patent through the foramen magnum. There is atherosclerotic disease at both vertebral artery V4 segments with stenosis estimated at 50% on both sides. Both vessels supply the basilar. No  basilar stenosis. 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 Venous sinuses: Patent and normal. Anatomic variants: None significant. Review of the MIP images confirms the above findings IMPRESSION: No acute large or medium vessel occlusion. Aortic Atherosclerosis (ICD10-I70.0) and Emphysema (ICD10-J43.9). Atherosclerotic disease at both  carotid bifurcation and ICA bulb regions. 30% ICA stenosis on the right. 80% or greater irregular stenosis of the ICA bulb on the left. Vessels are tortuous, swinging to the midline behind the oropharynx. Atherosclerotic disease of both vertebral artery V4 segments with stenoses estimated at 50%. 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 Electronically Signed   By: Nelson Chimes M.D.   On: 12/02/2019 15:56   MR BRAIN WO CONTRAST  Result Date: 12/02/2019 CLINICAL DATA:  Neuro deficit, acute, stroke suspected. Additional history provided: Acute onset slurred speech, headache, mild nausea without vomiting EXAM: MRI HEAD WITHOUT CONTRAST TECHNIQUE: Multiplanar, multiecho pulse sequences of the brain and surrounding structures were obtained without intravenous contrast. COMPARISON:  Noncontrast head CT 12/01/2019, noncontrast head CT 12/19/2017, brain MRI 08/26/2013, CT angiogram head 08/19/2013 FINDINGS: Brain: Multiple sequences are significantly motion degraded. Most notably there is moderate motion degradation of the axial and coronal diffusion-weighted sequence, moderate motion degradation of the axial SWI sequence, moderate motion degradation of the axial T1 weighted sequence, moderate/severe motion degradation of the axial T2 FLAIR sequence and moderate/severe motion degradation of the coronal T2 weighted sequence. Moderate to advanced patchy and confluent T2/FLAIR hyperintensity within the cerebral Toni White matter is nonspecific, but consistent with chronic small vessel ischemic disease. Findings have progressed as compared to prior MRI 08/26/2013. Redemonstrated chronic lacunar infarcts within the bilateral basal ganglia. Also again seen, there are chronic small-vessel ischemic changes within the thalami and pons. Chronic microhemorrhages within the right frontal lobe, left periatrial region and left cerebellum not definitively present on a  prior MRI. Stable, mild generalized parenchymal atrophy. There is no acute infarct. No evidence of intracranial mass. No extra-axial fluid collection. No midline shift. Vascular: Expected proximal arterial flow voids. 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 Skull and upper cervical spine: No focal marrow lesion Sinuses/Orbits: Visualized orbits show no acute finding. Mild ethmoid and right maxillary sinus mucosal thickening. No significant mastoid effusion. IMPRESSION: 1. Motion degraded examination as described. 2. No evidence of acute intracranial abnormality, including acute infarction. 3. Moderate to advanced chronic small vessel ischemic disease has progressed as compared to prior MRI 08/26/2013. Redemonstrated chronic lacunar infarcts within the bilateral basal ganglia. 4. Nonspecific chronic microhemorrhages within the right frontal lobe, left periatrial region and left cerebellum which were not definitively present on the prior MRI. 5. Stable, mild generalized parenchymal atrophy. 6. 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 Electronically Signed   By: Kellie Simmering DO   On: 12/02/2019 08:26   US Carotid Bilateral (at [facility] and [facility] only)  Result Date: 12/02/2019 CLINICAL DATA:  84 year old female with TIA EXAM: BILATERAL CAROTID DUPLEX ULTRASOUND TECHNIQUE: Pearline Cables scale imaging, color Doppler and duplex ultrasound were performed of bilateral carotid and vertebral arteries in the neck. COMPARISON:  None. FINDINGS: Criteria: Quantification of carotid stenosis is based on velocity parameters that correlate the residual internal carotid diameter with NASCET-based stenosis levels, using the diameter of the  distal internal carotid lumen as the denominator for stenosis measurement. The following velocity measurements were obtained: RIGHT ICA:  Systolic 99991111 cm/sec,  Diastolic 21 cm/sec CCA:  69 cm/sec SYSTOLIC ICA/CCA RATIO:  2.0 ECA:  120 cm/sec LEFT ICA:  Systolic 0000000 cm/sec, Diastolic 21 cm/sec CCA:  75 cm/sec SYSTOLIC ICA/CCA RATIO:  3.8 ECA:  1 4 cm/sec Right Brachial SBP: Not acquired Left Brachial SBP: Not acquired RIGHT CAROTID ARTERY: No significant calcifications of the right common carotid artery. Intermediate waveform maintained. Moderate heterogeneous and partially calcified plaque at the right carotid bifurcation. No significant lumen shadowing. Low resistance waveform of the right ICA. Tortuosity RIGHT VERTEBRAL ARTERY: Antegrade flow with low resistance waveform. LEFT CAROTID ARTERY: No significant calcifications of the left common carotid artery. Intermediate waveform maintained. Moderate heterogeneous and partially calcified plaque at the left carotid bifurcation. No significant lumen shadowing. Low resistance waveform of the left ICA. Tortuosity LEFT VERTEBRAL ARTERY:  Antegrade flow with low resistance waveform. IMPRESSION: Right: Color duplex indicates moderate heterogeneous and calcified plaque, with no hemodynamically significant stenosis by duplex criteria in the extracranial cerebrovascular circulation. Left: Heterogeneous and partially calcified plaque at the left carotid bifurcation, with discordant results regarding degree of stenosis by established duplex criteria. Peak velocity suggests 70% - 99% stenosis, with the ICA/ CCA ratio suggesting a lesser degree of stenosis. If establishing a more accurate degree of stenosis is required, cerebral angiogram should be considered, or as a second best test, CTA. Signed, Dulcy Fanny. Dellia Nims, RPVI [facility] Radiology Electronically Signed   By: Corrie Mckusick D.O.   On: 12/02/2019 11:33   DG Chest Portable 1 View  Result Date: 12/02/2019 CLINICAL DATA:  Slurred speech x2 days. EXAM: PORTABLE CHEST 1 VIEW COMPARISON:  January 26, 2014 FINDINGS: Mild diffuse  chronic appearing increased lung markings are seen. There is no evidence of acute infiltrate, pleural effusion or pneumothorax. The cardiac silhouette is moderately enlarged. There is marked severity calcification of the aortic arch. Multilevel degenerative changes seen throughout the thoracic spine. IMPRESSION: Chronic-appearing increased lung markings without evidence of acute or active cardiopulmonary disease. Electronically Signed   By: Virgina Norfolk M.D.   On: 12/02/2019 00:33   ECHOCARDIOGRAM COMPLETE  Result Date: 12/03/2019    ECHOCARDIOGRAM REPORT   Patient Name:   Toni White Date of Exam: 12/02/2019 Medical Rec #:  [id number]     Height:       61.0 in Accession #:    [id number]    Weight:       169.0 lb Date of Birth:  19-Feb-1936      BSA:          1.758 m Patient Age:    49 years      BP:           156/69 mmHg Patient Gender: F             HR:           70 bpm. Exam Location:  [facility] Procedure: 2D Echo, Cardiac Doppler and Color Doppler Indications:     TIA 435.9  History:         Patient has no prior history of Echocardiogram examinations.                  COPD; Risk Factors:Hypertension.  Sonographer:     Sherrie Sport RDCS (AE) Referring Phys:  [id number] Arvella Merles MANSY Diagnosing Phys: Yolonda Kida MD IMPRESSIONS  1. Left  ventricular ejection fraction, by estimation, is 65 to 70%. The left ventricle has normal function. The left ventricle has no regional wall motion abnormalities. There is mild concentric left ventricular hypertrophy. Left ventricular diastolic parameters are consistent with Grade I diastolic dysfunction (impaired relaxation).  2. Right ventricular systolic function is moderately reduced. The right ventricular size is moderately enlarged. Mildly increased right ventricular wall thickness. There is normal pulmonary artery systolic pressure.  3. The mitral valve is normal in structure. No evidence of mitral valve regurgitation.  4. The aortic valve is grossly normal. Aortic valve  regurgitation is not visualized. FINDINGS  Left Ventricle: Left ventricular ejection fraction, by estimation, is 65 to 70%. The left ventricle has normal function. The left ventricle has no regional wall motion abnormalities. The left ventricular internal cavity size was normal in size. There is  mild concentric left ventricular hypertrophy. Left ventricular diastolic parameters are consistent with Grade I diastolic dysfunction (impaired relaxation). Right Ventricle: The right ventricular size is moderately enlarged. Mildly increased right ventricular wall thickness. Right ventricular systolic function is moderately reduced. There is normal pulmonary artery systolic pressure. The tricuspid regurgitant velocity is 1.81 m/s, and with an assumed right atrial pressure of 10 mmHg, the estimated right ventricular systolic pressure is [id number] mmHg. Left Atrium: Left atrial size was normal in size. Right Atrium: Right atrial size was normal in size. Pericardium: Trivial pericardial effusion is present. Mitral Valve: The mitral valve is normal in structure. No evidence of mitral valve regurgitation. Tricuspid Valve: The tricuspid valve is normal in structure. Tricuspid valve regurgitation is trivial. Aortic Valve: The aortic valve is grossly normal. Aortic valve regurgitation is not visualized. Aortic valve mean gradient measures 3.0 mmHg. Aortic valve peak gradient measures 5.5 mmHg. Aortic valve area, by VTI measures 3.64 cm. Pulmonic Valve: The pulmonic valve was grossly normal. Pulmonic valve regurgitation is not visualized. Aorta: The aortic root is normal in size and structure. IAS/Shunts: No atrial level shunt detected by color flow Doppler.  LEFT VENTRICLE PLAX 2D LVIDd:         3.50 cm  Diastology LVIDs:         2.14 cm  LV e' lateral:   6.42 cm/s LV PW:         0.91 cm  LV E/e' lateral: 16.2 LV IVS:        1.21 cm  LV e' medial:    6.31 cm/s LVOT diam:     2.00 cm  LV E/e' medial:  16.5 LV SV:         82 LV SV Index:    47 LVOT Area:     3.14 cm  RIGHT VENTRICLE RV Basal diam:  2.26 cm LEFT ATRIUM             Index       RIGHT ATRIUM           Index LA diam:        3.40 cm 1.93 cm/m  RA Area:     11.70 cm LA Vol (A2C):   33.6 ml 19.11 ml/m RA Volume:   23.20 ml  13.19 ml/m LA Vol (A4C):   33.9 ml 19.28 ml/m LA Biplane Vol: 33.6 ml 19.11 ml/m  AORTIC VALVE                   PULMONIC VALVE AV Area (Vmax):    3.11 cm    PV Vmax:       0.70 m/s AV Area (  Vmean):   3.72 cm    PV Peak grad:  2.0 mmHg AV Area (VTI):     3.64 cm AV Vmax:           117.00 cm/s AV Vmean:          79.400 cm/s AV VTI:            0.225 m AV Peak Grad:      5.5 mmHg AV Mean Grad:      3.0 mmHg LVOT Vmax:         116.00 cm/s LVOT Vmean:        93.900 cm/s LVOT VTI:          0.261 m LVOT/AV VTI ratio: 1.16  AORTA Ao Root diam: 3.10 cm MITRAL VALVE                TRICUSPID VALVE MV Area (PHT): 2.18 cm     TR Peak grad:   13.1 mmHg MV Decel Time: 348 msec     TR Vmax:        181.00 cm/s MV E velocity: 104.00 cm/s MV A velocity: 135.00 cm/s  SHUNTS MV E/A ratio:  0.77         Systemic VTI:  0.26 m                             Systemic Diam: 2.00 cm Toni D Callwood MD Electronically signed by Yolonda Kida MD Signature Date/Time: 12/03/2019/6:39:32 AM    Final      Echo: normal LV systolic function with EF of 65-70% estimated; Grade I diastolic dysfunction.   TELEMETRY: NSR. HR=89  ASSESSMENT AND PLAN:  Active Problems:   Hypertension   CVA (cerebral vascular accident) ([place])   Aphasia   Dysarthria   Carotid stenosis, left   Chronic respiratory failure with hypoxia (HCC)   TIA (transient ischemic attack)    1. Slurred speech likely due to TIA, resolved, left ICA stenosis of 80% confirmed by CTA         -Echocardiogram benign.               -Agree with dual antiplatelet therapy             -Neurology consult appreciated             -Vascular Surgery consult appreciated             -Agree with current plan             -Recommend  continuous telemetry monitoring.  -Recommend repeating CBC, CMP, PT/INR prior to procedure.   The patient is cleared from a Cardiac standpoint for left ICA stenting procedure.    2. Hypertensive Urgency, reasonably stable at this time             -We will continue management with Amlodipine, metoprolol and Iv labetalol as needed.             -Recommend q4h bp monitoring and heart healthy diet  3. COPD, reasonably stable              -Agree with current management.             -Recommend RT consult  4. Dyslipidemia, reasonably controlled             -We will continue statin therapy        The patient's history, physical exam findings and plan of  care were all discussed with Dr. Karma Greaser D. White, whom also evaluated the patient, and all decision making was made in collaboration with him.   Toni White, [id number]-AG  12/03/2019 1:23 PM

## 2019-12-03 NOTE — Progress Notes (Signed)
PROGRESS NOTE    Toni White  [id number] DOB: [birth year] DOA: 12/01/2019 PCP: Birdie Sons, MD    Brief Narrative:  Toni White  is a 84 y.o. pleasant Caucasian female with a known history of COPD, hypertension and dyslipidemia, who presented to the emergency room with an acute onset of slurred speech that started a couple of days ago without paresthesias or focal muscle weakness    Consultants:   Vascular surgery  Procedures:  Antimicrobials:      Subjective: Feels tired, no new complaints. 00  Objective: Vitals:   12/02/19 1601 12/02/19 2057 12/02/19 2333 12/03/19 0734  BP: (!) 155/71 (!) 162/77 (!) 162/76 135/69  Pulse: 76 76 81 86  Resp: 20 18 18    Temp: 98.4 F (36.9 C) 98.8 F (37.1 C) 98.2 F (36.8 C) 98.1 F (36.7 C)  TempSrc: Oral Oral Oral Oral  SpO2: 100% 98% 97% 99%  Weight:      Height:       No intake or output data in the 24 hours ending 12/03/19 1340 Filed Weights   12/01/19 1936  Weight: 76.7 kg    Examination:  General exam: Appears calm and comfortable  Respiratory system: Clear to auscultation. Respiratory effort normal. Cardiovascular system: S1 & S2 heard, RRR. No JVD, murmurs, rubs, gallops or clicks. No pedal edema. Gastrointestinal system: Abdomen is nondistended, soft and nontender. No organomegaly or masses felt. Normal bowel sounds heard. Central nervous system: Alert and oriented. No focal neurological deficits. Extremities: Symmetric 5 x 5 power. Skin: No rashes, lesions or ulcers Psychiatry: Judgement and insight appear normal. Mood & affect appropriate.     Data Reviewed: I have personally reviewed following labs and imaging studies  CBC: Recent Labs  Lab 12/01/19 1950  WBC 15.3*  NEUTROABS 11.8*  HGB 14.9  HCT 45.2  MCV 89.2  PLT [id number]   Basic Metabolic Panel: Recent Labs  Lab 12/01/19 1950  NA 143  K 3.6  CL 96*  CO2 35*  GLUCOSE 100*  BUN 24*  CREATININE 0.91  CALCIUM 9.7   GFR: Estimated  Creatinine Clearance: 45.5 mL/min (by C-G formula based on SCr of 0.91 mg/dL). Liver Function Tests: Recent Labs  Lab 12/01/19 1950  AST 27  ALT 35  ALKPHOS 55  BILITOT 0.7  PROT 7.1  ALBUMIN 3.6   No results for input(s): LIPASE, AMYLASE in the last 168 hours. No results for input(s): AMMONIA in the last 168 hours. Coagulation Profile: Recent Labs  Lab 12/01/19 1950  INR 1.0   Cardiac Enzymes: No results for input(s): CKTOTAL, CKMB, CKMBINDEX, TROPONINI in the last 168 hours. BNP (last 3 results) No results for input(s): PROBNP in the last 8760 hours. HbA1C: Recent Labs    12/02/19 0544  HGBA1C 6.0*   CBG: No results for input(s): GLUCAP in the last 168 hours. Lipid Profile: Recent Labs    12/02/19 0544  CHOL 179  HDL 54  LDLCALC 99  TRIG 130  CHOLHDL 3.3   Thyroid Function Tests: Recent Labs    12/02/19 1415  TSH 3.061   Anemia Panel: Recent Labs    12/02/19 1415  VITAMINB12 331  FOLATE 35.0   Sepsis Labs: No results for input(s): PROCALCITON, LATICACIDVEN in the last 168 hours.  Recent Results (from the past 240 hour(s))  Respiratory Panel by RT PCR (Flu A&B, Covid) - Nasopharyngeal Swab     Status: None   Collection Time: 12/02/19 12:19 AM   Specimen: Nasopharyngeal Swab  Result Value Ref Range Status   SARS Coronavirus 2 by RT PCR NEGATIVE NEGATIVE Final    Comment: (NOTE) SARS-CoV-2 target nucleic acids are NOT DETECTED. The SARS-CoV-2 RNA is generally detectable in upper respiratoy specimens during the acute phase of infection. The lowest concentration of SARS-CoV-2 viral copies this assay can detect is 131 copies/mL. A negative result does not preclude SARS-Cov-2 infection and should not be used as the sole basis for treatment or other patient management decisions. A negative result may occur with  improper specimen collection/handling, submission of specimen other than nasopharyngeal swab, presence of viral mutation(s) within  the areas targeted by this assay, and inadequate number of viral copies (<131 copies/mL). A negative result must be combined with clinical observations, patient history, and epidemiological information. The expected result is Negative. Fact Sheet for Patients:  PinkCheek.be Fact Sheet for Healthcare Providers:  GravelBags.it This test is not yet ap proved or cleared by the [place] FDA and  has been authorized for detection and/or diagnosis of SARS-CoV-2 by FDA under an Emergency Use Authorization (EUA). This EUA will remain  in effect (meaning this test can be used) for the duration of the COVID-19 declaration under Section 564(b)(1) of the Act, 21 U.S.C. section 360bbb-3(b)(1), unless the authorization is terminated or revoked sooner.    Influenza A by PCR NEGATIVE NEGATIVE Final   Influenza B by PCR NEGATIVE NEGATIVE Final    Comment: (NOTE) The Xpert Xpress SARS-CoV-2/FLU/RSV assay is intended as an aid in  the diagnosis of influenza from Nasopharyngeal swab specimens and  should not be used as a sole basis for treatment. Nasal washings and  aspirates are unacceptable for Xpert Xpress SARS-CoV-2/FLU/RSV  testing. Fact Sheet for Patients: PinkCheek.be Fact Sheet for Healthcare Providers: GravelBags.it This test is not yet approved or cleared by the [place] FDA and  has been authorized for detection and/or diagnosis of SARS-CoV-2 by  FDA under an Emergency Use Authorization (EUA). This EUA will remain  in effect (meaning this test can be used) for the duration of the  Covid-19 declaration under Section 564(b)(1) of the Act, 21  U.S.C. section 360bbb-3(b)(1), unless the authorization is  terminated or revoked. Performed at [facility], [address], [place], [place] [address]          Radiology Studies: CT ANGIO HEAD W OR WO  CONTRAST  Result Date: 12/02/2019 CLINICAL DATA:  Carotid artery stenosis. Recent slurred speech and mental status changes. Basilar tip aneurysm. Left carotid stenosis by ultrasound. EXAM: CT ANGIOGRAPHY HEAD AND NECK TECHNIQUE: Multidetector CT imaging of the head and neck was performed using the standard protocol during bolus administration of intravenous contrast. Multiplanar CT image reconstructions and MIPs were obtained to evaluate the vascular anatomy. Carotid stenosis measurements (when applicable) are obtained utilizing NASCET criteria, using the distal internal carotid diameter as the denominator. CONTRAST:  18mL OMNIPAQUE IOHEXOL 350 MG/ML SOLN COMPARISON:  MRI same day. Ultrasound same day. MRI 08/26/2013. CT angiography 08/19/2013. FINDINGS: CT HEAD FINDINGS Brain: Brain atrophy with chronic small-vessel ischemic changes affecting the pons, thalami, basal ganglia and hemispheric white matter. No sign of acute infarction, mass lesion, hemorrhage, hydrocephalus or extra-axial collection. Vascular: There is atherosclerotic calcification of the major vessels at the base of the brain. 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 See below. Skull: Negative Sinuses: Clear sinuses. Orbits: Lens calcification on the right. Review of the MIP images confirms the above findings CTA NECK FINDINGS Aortic arch: Aortic atherosclerotic calcification. No aneurysm or dissection. Branching  pattern is normal without flow limiting stenosis. Right carotid system: Common carotid artery widely patent to the bifurcation and ICA bulb. Somewhat irregular soft and calcified plaque affecting the ICA bulb with minimal diameter of 3.4 mm. Compared to a more distal cervical ICA diameter of 5 mm, this indicates a 30% stenosis. Vessels are tortuous, swinging to the midline behind the oropharynx. Left carotid system: Common carotid artery is patent to the bifurcation. Severe irregular calcified plaque at the carotid bifurcation and ICA bulb.  Luminal stenosis likely 1 mm. Compared to a more distal cervical ICA diameter of 5 mm, this indicates an 80% stenosis. Vessels are tortuous, swinging to the midline behind the oropharynx. Vertebral arteries: Calcified plaque at both vertebral artery origin regions but without stenosis greater than 30%. Scattered areas of calcified plaque through the cervical course of both vertebral arteries but no stenosis greater than 30%. Skeleton: Degenerative cervical spondylosis. Other neck: No mass or lymphadenopathy. Upper chest: Emphysema and pulmonary scarring at the apices. Review of the MIP images confirms the above findings CTA HEAD FINDINGS Anterior circulation: Both internal carotid arteries are patent through the skull base and siphon regions. Ordinary siphon atherosclerotic calcification but without stenosis greater than 30%. Anterior and middle cerebral vessels are patent without large or medium vessel occlusion, aneurysm or vascular malformation. Posterior circulation: Both vertebral arteries are patent through the foramen magnum. There is atherosclerotic disease at both vertebral artery V4 segments with stenosis estimated at 50% on both sides. Both vessels supply the basilar. No basilar stenosis. 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 Venous sinuses: Patent and normal. Anatomic variants: None significant. Review of the MIP images confirms the above findings IMPRESSION: No acute large or medium vessel occlusion. Aortic Atherosclerosis (ICD10-I70.0) and Emphysema (ICD10-J43.9). Atherosclerotic disease at both carotid bifurcation and ICA bulb regions. 30% ICA stenosis on the right. 80% or greater irregular stenosis of the ICA bulb on the left. Vessels are tortuous, swinging to the midline behind the oropharynx. Atherosclerotic disease of both vertebral artery V4 segments with stenoses estimated at 50%. 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 Electronically Signed   By: Nelson Chimes M.D.   On: 12/02/2019 15:56   CT HEAD WO CONTRAST  Result Date: 12/01/2019 CLINICAL DATA:  Slurred speech x2 days. EXAM: CT HEAD WITHOUT CONTRAST TECHNIQUE: Contiguous axial images were obtained from the base of the skull through the vertex without intravenous contrast. COMPARISON:  Dec 19, 2017 FINDINGS: Brain: There is mild cerebral atrophy with widening of the extra-axial spaces and ventricular dilatation. There are areas of decreased attenuation within the white matter tracts of the supratentorial brain, consistent with microvascular disease changes. Small chronic bilateral basal ganglia lacunar infarcts are seen. 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 Skull: Normal. Negative for fracture or focal lesion. Sinuses/Orbits: No acute finding. Other: None. IMPRESSION: 1. Generalized cerebral atrophy. 2. No acute intracranial abnormality. 3. 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 4. Small chronic bilateral basal ganglia lacunar infarcts. Electronically Signed   By: Virgina Norfolk M.D.   On: 12/01/2019 20:16   CT ANGIO NECK W OR WO CONTRAST  Result Date: 12/02/2019 CLINICAL DATA:  Carotid artery stenosis. Recent slurred speech and mental status changes. Basilar tip aneurysm. Left  carotid stenosis by ultrasound. EXAM: CT ANGIOGRAPHY HEAD AND NECK TECHNIQUE: Multidetector CT imaging of the head and neck was performed using the standard protocol during bolus administration of intravenous contrast. Multiplanar CT image reconstructions and MIPs were obtained to evaluate the vascular anatomy. Carotid stenosis measurements (when applicable) are obtained utilizing NASCET criteria, using the distal internal carotid diameter as the denominator. CONTRAST:  63mL  OMNIPAQUE IOHEXOL 350 MG/ML SOLN COMPARISON:  MRI same day. Ultrasound same day. MRI 08/26/2013. CT angiography 08/19/2013. FINDINGS: CT HEAD FINDINGS Brain: Brain atrophy with chronic small-vessel ischemic changes affecting the pons, thalami, basal ganglia and hemispheric white matter. No sign of acute infarction, mass lesion, hemorrhage, hydrocephalus or extra-axial collection. Vascular: There is atherosclerotic calcification of the major vessels at the base of the brain. 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 See below. Skull: Negative Sinuses: Clear sinuses. Orbits: Lens calcification on the right. Review of the MIP images confirms the above findings CTA NECK FINDINGS Aortic arch: Aortic atherosclerotic calcification. No aneurysm or dissection. Branching pattern is normal without flow limiting stenosis. Right carotid system: Common carotid artery widely patent to the bifurcation and ICA bulb. Somewhat irregular soft and calcified plaque affecting the ICA bulb with minimal diameter of 3.4 mm. Compared to a more distal cervical ICA diameter of 5 mm, this indicates a 30% stenosis. Vessels are tortuous, swinging to the midline behind the oropharynx. Left carotid system: Common carotid artery is patent to the bifurcation. Severe irregular calcified plaque at the carotid bifurcation and ICA bulb. Luminal stenosis likely 1 mm. Compared to a more distal cervical ICA diameter of 5 mm, this indicates an 80% stenosis. Vessels are tortuous, swinging to the midline behind the oropharynx. Vertebral arteries: Calcified plaque at both vertebral artery origin regions but without stenosis greater than 30%. Scattered areas of calcified plaque through the cervical course of both vertebral arteries but no stenosis greater than 30%. Skeleton: Degenerative cervical spondylosis. Other neck: No mass or lymphadenopathy. Upper chest: Emphysema and pulmonary scarring at the apices. Review of the MIP images confirms the above findings CTA HEAD  FINDINGS Anterior circulation: Both internal carotid arteries are patent through the skull base and siphon regions. Ordinary siphon atherosclerotic calcification but without stenosis greater than 30%. Anterior and middle cerebral vessels are patent without large or medium vessel occlusion, aneurysm or vascular malformation. Posterior circulation: Both vertebral arteries are patent through the foramen magnum. There is atherosclerotic disease at both vertebral artery V4 segments with stenosis estimated at 50% on both sides. Both vessels supply the basilar. No basilar stenosis. 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 Venous sinuses: Patent and normal. Anatomic variants: None significant. Review of the MIP images confirms the above findings IMPRESSION: No acute large or medium vessel occlusion. Aortic Atherosclerosis (ICD10-I70.0) and Emphysema (ICD10-J43.9). Atherosclerotic disease at both carotid bifurcation and ICA bulb regions. 30% ICA stenosis on the right. 80% or greater irregular stenosis of the ICA bulb on the left. Vessels are tortuous, swinging to the midline behind the oropharynx. Atherosclerotic disease of both vertebral artery V4 segments with stenoses estimated at 50%. 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 Electronically Signed   By: Nelson Chimes M.D.   On: 12/02/2019 15:56   MR BRAIN WO CONTRAST  Result Date: 12/02/2019 CLINICAL DATA:  Neuro deficit, acute, stroke suspected. Additional history provided: Acute onset slurred speech, headache, mild nausea without  vomiting EXAM: MRI HEAD WITHOUT CONTRAST TECHNIQUE: Multiplanar, multiecho pulse sequences of the brain and surrounding structures were obtained without intravenous contrast. COMPARISON:  Noncontrast head CT 12/01/2019, noncontrast head CT 12/19/2017, brain MRI 08/26/2013, CT angiogram head 08/19/2013 FINDINGS: Brain:  Multiple sequences are significantly motion degraded. Most notably there is moderate motion degradation of the axial and coronal diffusion-weighted sequence, moderate motion degradation of the axial SWI sequence, moderate motion degradation of the axial T1 weighted sequence, moderate/severe motion degradation of the axial T2 FLAIR sequence and moderate/severe motion degradation of the coronal T2 weighted sequence. Moderate to advanced patchy and confluent T2/FLAIR hyperintensity within the cerebral white matter is nonspecific, but consistent with chronic small vessel ischemic disease. Findings have progressed as compared to prior MRI 08/26/2013. Redemonstrated chronic lacunar infarcts within the bilateral basal ganglia. Also again seen, there are chronic small-vessel ischemic changes within the thalami and pons. Chronic microhemorrhages within the right frontal lobe, left periatrial region and left cerebellum not definitively present on a prior MRI. Stable, mild generalized parenchymal atrophy. There is no acute infarct. No evidence of intracranial mass. No extra-axial fluid collection. No midline shift. Vascular: Expected proximal arterial flow voids. 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 Skull and upper cervical spine: No focal marrow lesion Sinuses/Orbits: Visualized orbits show no acute finding. Mild ethmoid and right maxillary sinus mucosal thickening. No significant mastoid effusion. IMPRESSION: 1. Motion degraded examination as described. 2. No evidence of acute intracranial abnormality, including acute infarction. 3. Moderate to advanced chronic small vessel ischemic disease has progressed as compared to prior MRI 08/26/2013. Redemonstrated chronic lacunar infarcts within the bilateral basal ganglia. 4. Nonspecific chronic microhemorrhages within the right frontal lobe, left periatrial region and left cerebellum which were not  definitively present on the prior MRI. 5. Stable, mild generalized parenchymal atrophy. 6. 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 Electronically Signed   By: Kellie Simmering DO   On: 12/02/2019 08:26   US Carotid Bilateral (at [facility] and [facility] only)  Result Date: 12/02/2019 CLINICAL DATA:  84 year old female with TIA EXAM: BILATERAL CAROTID DUPLEX ULTRASOUND TECHNIQUE: Pearline Cables scale imaging, color Doppler and duplex ultrasound were performed of bilateral carotid and vertebral arteries in the neck. COMPARISON:  None. FINDINGS: Criteria: Quantification of carotid stenosis is based on velocity parameters that correlate the residual internal carotid diameter with NASCET-based stenosis levels, using the diameter of the distal internal carotid lumen as the denominator for stenosis measurement. The following velocity measurements were obtained: RIGHT ICA:  Systolic 99991111 cm/sec, Diastolic 21 cm/sec CCA:  69 cm/sec SYSTOLIC ICA/CCA RATIO:  2.0 ECA:  120 cm/sec LEFT ICA:  Systolic 0000000 cm/sec, Diastolic 21 cm/sec CCA:  75 cm/sec SYSTOLIC ICA/CCA RATIO:  3.8 ECA:  1 4 cm/sec Right Brachial SBP: Not acquired Left Brachial SBP: Not acquired RIGHT CAROTID ARTERY: No significant calcifications of the right common carotid artery. Intermediate waveform maintained. Moderate heterogeneous and partially calcified plaque at the right carotid bifurcation. No significant lumen shadowing. Low resistance waveform of the right ICA. Tortuosity RIGHT VERTEBRAL ARTERY: Antegrade flow with low resistance waveform. LEFT CAROTID ARTERY: No significant calcifications of the left common carotid artery. Intermediate waveform maintained. Moderate heterogeneous and partially calcified plaque at the left carotid bifurcation. No significant lumen shadowing. Low resistance waveform of the left ICA. Tortuosity LEFT VERTEBRAL ARTERY:  Antegrade flow with low resistance waveform.  IMPRESSION:  Right: Color duplex indicates moderate heterogeneous and calcified plaque, with no hemodynamically significant stenosis by duplex criteria in the extracranial cerebrovascular circulation. Left: Heterogeneous and partially calcified plaque at the left carotid bifurcation, with discordant results regarding degree of stenosis by established duplex criteria. Peak velocity suggests 70% - 99% stenosis, with the ICA/ CCA ratio suggesting a lesser degree of stenosis. If establishing a more accurate degree of stenosis is required, cerebral angiogram should be considered, or as a second best test, CTA. Signed, Dulcy Fanny. Dellia Nims, RPVI [facility] Electronically Signed   By: Corrie Mckusick D.O.   On: 12/02/2019 11:33   DG Chest Portable 1 View  Result Date: 12/02/2019 CLINICAL DATA:  Slurred speech x2 days. EXAM: PORTABLE CHEST 1 VIEW COMPARISON:  January 26, 2014 FINDINGS: Mild diffuse chronic appearing increased lung markings are seen. There is no evidence of acute infiltrate, pleural effusion or pneumothorax. The cardiac silhouette is moderately enlarged. There is marked severity calcification of the aortic arch. Multilevel degenerative changes seen throughout the thoracic spine. IMPRESSION: Chronic-appearing increased lung markings without evidence of acute or active cardiopulmonary disease. Electronically Signed   By: Virgina Norfolk M.D.   On: 12/02/2019 00:33   ECHOCARDIOGRAM COMPLETE  Result Date: 12/03/2019    ECHOCARDIOGRAM REPORT   Patient Name:   Toni White Date of Exam: 12/02/2019 Medical Rec #:  [id number]     Height:       61.0 in Accession #:    [id number]    Weight:       169.0 lb Date of Birth:  1936/04/10      BSA:          1.758 m Patient Age:    51 years      BP:           156/69 mmHg Patient Gender: F             HR:           70 bpm. Exam Location:  [facility] Procedure: 2D Echo, Cardiac Doppler and Color Doppler Indications:     TIA 435.9   History:         Patient has no prior history of Echocardiogram examinations.                  COPD; Risk Factors:Hypertension.  Sonographer:     Sherrie Sport RDCS (AE) Referring Phys:  [id number] Arvella Merles MANSY Diagnosing Phys: Yolonda Kida MD IMPRESSIONS  1. Left ventricular ejection fraction, by estimation, is 65 to 70%. The left ventricle has normal function. The left ventricle has no regional wall motion abnormalities. There is mild concentric left ventricular hypertrophy. Left ventricular diastolic parameters are consistent with Grade I diastolic dysfunction (impaired relaxation).  2. Right ventricular systolic function is moderately reduced. The right ventricular size is moderately enlarged. Mildly increased right ventricular wall thickness. There is normal pulmonary artery systolic pressure.  3. The mitral valve is normal in structure. No evidence of mitral valve regurgitation.  4. The aortic valve is grossly normal. Aortic valve regurgitation is not visualized. FINDINGS  Left Ventricle: Left ventricular ejection fraction, by estimation, is 65 to 70%. The left ventricle has normal function. The left ventricle has no regional wall motion abnormalities. The left ventricular internal cavity size was normal in size. There is  mild concentric left ventricular hypertrophy. Left ventricular diastolic parameters are consistent with Grade I diastolic dysfunction (impaired relaxation). Right Ventricle: The right  ventricular size is moderately enlarged. Mildly increased right ventricular wall thickness. Right ventricular systolic function is moderately reduced. There is normal pulmonary artery systolic pressure. The tricuspid regurgitant velocity is 1.81 m/s, and with an assumed right atrial pressure of 10 mmHg, the estimated right ventricular systolic pressure is [id number] mmHg. Left Atrium: Left atrial size was normal in size. Right Atrium: Right atrial size was normal in size. Pericardium: Trivial pericardial effusion is  present. Mitral Valve: The mitral valve is normal in structure. No evidence of mitral valve regurgitation. Tricuspid Valve: The tricuspid valve is normal in structure. Tricuspid valve regurgitation is trivial. Aortic Valve: The aortic valve is grossly normal. Aortic valve regurgitation is not visualized. Aortic valve mean gradient measures 3.0 mmHg. Aortic valve peak gradient measures 5.5 mmHg. Aortic valve area, by VTI measures 3.64 cm. Pulmonic Valve: The pulmonic valve was grossly normal. Pulmonic valve regurgitation is not visualized. Aorta: The aortic root is normal in size and structure. IAS/Shunts: No atrial level shunt detected by color flow Doppler.  LEFT VENTRICLE PLAX 2D LVIDd:         3.50 cm  Diastology LVIDs:         2.14 cm  LV e' lateral:   6.42 cm/s LV PW:         0.91 cm  LV E/e' lateral: 16.2 LV IVS:        1.21 cm  LV e' medial:    6.31 cm/s LVOT diam:     2.00 cm  LV E/e' medial:  16.5 LV SV:         82 LV SV Index:   47 LVOT Area:     3.14 cm  RIGHT VENTRICLE RV Basal diam:  2.26 cm LEFT ATRIUM             Index       RIGHT ATRIUM           Index LA diam:        3.40 cm 1.93 cm/m  RA Area:     11.70 cm LA Vol (A2C):   33.6 ml 19.11 ml/m RA Volume:   23.20 ml  13.19 ml/m LA Vol (A4C):   33.9 ml 19.28 ml/m LA Biplane Vol: 33.6 ml 19.11 ml/m  AORTIC VALVE                   PULMONIC VALVE AV Area (Vmax):    3.11 cm    PV Vmax:       0.70 m/s AV Area (Vmean):   3.72 cm    PV Peak grad:  2.0 mmHg AV Area (VTI):     3.64 cm AV Vmax:           117.00 cm/s AV Vmean:          79.400 cm/s AV VTI:            0.225 m AV Peak Grad:      5.5 mmHg AV Mean Grad:      3.0 mmHg LVOT Vmax:         116.00 cm/s LVOT Vmean:        93.900 cm/s LVOT VTI:          0.261 m LVOT/AV VTI ratio: 1.16  AORTA Ao Root diam: 3.10 cm MITRAL VALVE                TRICUSPID VALVE MV Area (PHT): 2.18 cm     TR Peak grad:   13.1 mmHg  MV Decel Time: 348 msec     TR Vmax:        181.00 cm/s MV E velocity: 104.00 cm/s MV A  velocity: 135.00 cm/s  SHUNTS MV E/A ratio:  0.77         Systemic VTI:  0.26 m                             Systemic Diam: 2.00 cm Dwayne Prince Rome MD Electronically signed by Yolonda Kida MD Signature Date/Time: 12/03/2019/6:39:32 AM    Final         Scheduled Meds: . amLODipine  2.5 mg Oral Daily  . aspirin EC  81 mg Oral Daily  . clopidogrel  75 mg Oral Daily  . enoxaparin (LOVENOX) injection  40 mg Subcutaneous Q24H  . furosemide  20 mg Oral Daily  . gabapentin  100 mg Oral BID  . levothyroxine  37.5 mcg Oral Q0600  . metoprolol succinate  25 mg Oral Daily  . multivitamin-lutein  1 capsule Oral BID  . pantoprazole  40 mg Oral Daily  . pravastatin  40 mg Oral QHS  . roflumilast  500 mcg Oral Daily  . tiotropium  18 mcg Inhalation Daily   Continuous Infusions:  Assessment & Plan:   Active Problems:   Hypertension   CVA (cerebral vascular accident) [facility])   Aphasia   Dysarthria   Carotid stenosis, left   Chronic respiratory failure with hypoxia (HCC)   TIA (transient ischemic attack)   1. Slurred speech and visual hallucinations concerning for TIA/left carotid stenosis 80%-- noted on CT angiogram of head and neck - continue her aspirin and add Plavix for dual antiplatelet therapy--per  Neurology recommendaiton -continue statin therapy and check fasting lipids. -MRI brain --no acute CVA,but does show extensive small vessel ischemic changes and evidence of chronic microhemorrhages. These changes have been progressive over time when compared to old imaging.  --seen by  Dr. Doy Mince  -- by speech therapist patient on regular consistency diet. Her speech has improved -physical therapy recommends home health PT. -- Vascular consultation with Dr. Delana Meyer-- carotid artery stenosis, needs stent placement -- cardiology consultation with Dr. Clayborn Bigness-- cardiac clearance for carotid artery procedure plan for Friday.   2. Hypertensive urgency. -Has improved -Continue  antihypertensive and IV labetalol mildly permissive parameters. -- On amlodipine and metoprolol adjust dose according to blood pressure  3. Hypothyroidism. -continue Synthroid and check TSH level.  4. COPD. -continue her Spiriva, her Daliresp and as needed albuterol and hold off Advair Diskus.  5.GERD. - continue PPI therapy.  6. Depression/anxiety. - continue Celexa.  7. Dyslipidemia. -continue statin therapy.  8. DVT prophylaxis. -Subcutaneous Lovenox.     DVT prophylaxis: lovenox Code Status: DNR Family Communication: Called daughter Herbie Saxon but did not pick up Disposition Plan: Planning for carotid artery stenosis stent placement by vascular surgery Barrier: Vascular procedure pending       LOS: 1 day   Time spent: 45 minutes with more than 50% COC    Nolberto Hanlon, MD [facility] Pager [phone]  If 7PM-7AM, please contact night-coverage [email or web address] Password [email or web address] 12/03/2019, 1:40 PM

## 2019-12-04 ENCOUNTER — Encounter
Admission: EM | Disposition: A | Discharge status: Home Health | Payer: Self-pay | Source: Home / Self Care | Attending: Internal Medicine

## 2019-12-04 ENCOUNTER — Encounter: Payer: Self-pay | Admitting: Internal Medicine

## 2019-12-04 DIAGNOSIS — I6522 Occlusion and stenosis of left carotid artery: Secondary | ICD-10-CM

## 2019-12-04 HISTORY — PX: CAROTID PTA/STENT INTERVENTION: CATH118231

## 2019-12-04 LAB — GLUCOSE, CAPILLARY
Glucose-Capillary: 83 mg/dL (ref 70–99)
Glucose-Capillary: 105 mg/dL — ABNORMAL HIGH (ref 70–99)

## 2019-12-04 LAB — BASIC METABOLIC PANEL
Anion gap: 10 (ref 5–15)
BUN: 18 mg/dL (ref 8–23)
CO2: 31 mmol/L (ref 22–32)
Calcium: 8.5 mg/dL — ABNORMAL LOW (ref 8.9–10.3)
Chloride: 105 mmol/L (ref 98–111)
Creatinine, Ser: 0.68 mg/dL (ref 0.44–1.00)
GFR calc Af Amer: >60 mL/min (ref >60)
GFR calc non Af Amer: >60 mL/min (ref >60)
Glucose, Bld: 92 mg/dL (ref 70–99)
Potassium: 3.2 mmol/L — ABNORMAL LOW (ref 3.5–5.1)
Sodium: 146 mmol/L — ABNORMAL HIGH (ref 135–145)

## 2019-12-04 LAB — APTT: aPTT: 32 seconds (ref 24–36)

## 2019-12-04 LAB — PROTIME-INR
INR: 1.1 (ref 0.8–1.2)
Prothrombin Time: 13.8 seconds (ref 11.4–15.2)

## 2019-12-04 LAB — CBC
HCT: 41.5 % (ref 36.0–46.0)
Hemoglobin: 13.2 g/dL (ref 12.0–15.0)
MCH: 28.9 pg (ref 26.0–34.0)
MCHC: 31.8 g/dL (ref 30.0–36.0)
MCV: 90.8 fL (ref 80.0–100.0)
Platelets: 231 10*3/uL (ref 150–400)
RBC: 4.57 MIL/uL (ref 3.87–5.11)
RDW: 12 % (ref 11.5–15.5)
WBC: 11.5 10*3/uL — ABNORMAL HIGH (ref 4.0–10.5)
nRBC: 0 % (ref 0.0–0.2)

## 2019-12-04 LAB — TYPE AND SCREEN
ABO/RH(D): A POS
Antibody Screen: NEGATIVE

## 2019-12-04 LAB — MAGNESIUM: Magnesium: 1.8 mg/dL (ref 1.7–2.4)

## 2019-12-04 SURGERY — CAROTID PTA/STENT INTERVENTION
Anesthesia: Moderate Sedation | Laterality: Left

## 2019-12-04 MED ORDER — HEPARIN SODIUM (PORCINE) 1000 UNIT/ML IJ SOLN
INTRAMUSCULAR | Status: DC | PRN
  Administered 2019-12-04: 7500 [IU] via INTRAVENOUS
  Administered 2019-12-04: 2000 [IU] via INTRAVENOUS

## 2019-12-04 MED ORDER — ATROPINE SULFATE 1 MG/10ML IJ SOSY
PREFILLED_SYRINGE | INTRAMUSCULAR | Status: AC
  Filled 2019-12-04: qty 20

## 2019-12-04 MED ORDER — FENTANYL CITRATE (PF) 100 MCG/2ML IJ SOLN
INTRAMUSCULAR | Status: AC
  Filled 2019-12-04: qty 2

## 2019-12-04 MED ORDER — FENTANYL CITRATE (PF) 100 MCG/2ML IJ SOLN
INTRAMUSCULAR | Status: DC | PRN
  Administered 2019-12-04 (×2): 25 ug via INTRAVENOUS

## 2019-12-04 MED ORDER — IODIXANOL 320 MG/ML IV SOLN
INTRAVENOUS | Status: DC | PRN
  Administered 2019-12-04: 10:00:00 50 mL via INTRA_ARTERIAL

## 2019-12-04 MED ORDER — DOPAMINE-DEXTROSE 3.2-5 MG/ML-% IV SOLN
INTRAVENOUS | Status: AC
  Filled 2019-12-04: qty 250

## 2019-12-04 MED ORDER — PHENYLEPHRINE HCL (PRESSORS) 10 MG/ML IV SOLN
INTRAVENOUS | Status: AC
  Filled 2019-12-04: qty 1

## 2019-12-04 MED ORDER — CEFAZOLIN SODIUM-DEXTROSE 2-4 GM/100ML-% IV SOLN
INTRAVENOUS | Status: AC
  Filled 2019-12-04: qty 100

## 2019-12-04 MED ORDER — HEPARIN SODIUM (PORCINE) 1000 UNIT/ML IJ SOLN
INTRAMUSCULAR | Status: AC
  Filled 2019-12-04: qty 1

## 2019-12-04 MED ORDER — MIDAZOLAM HCL 2 MG/2ML IJ SOLN
INTRAMUSCULAR | Status: DC | PRN
  Administered 2019-12-04: 2 mg via INTRAVENOUS
  Administered 2019-12-04: 0.5 mg via INTRAVENOUS

## 2019-12-04 MED ORDER — MIDAZOLAM HCL 5 MG/5ML IJ SOLN
INTRAMUSCULAR | Status: AC
  Filled 2019-12-04: qty 5

## 2019-12-04 SURGICAL SUPPLY — 26 items
CATH 4FR 100 MARINER ST (CATHETERS) ×1 IMPLANT
CATH 4FR 100CM MARINER ST (CATHETERS) ×2
CATH ANGIO 5F 100CM .035 PIG (CATHETERS) ×3 IMPLANT
CATH BEACON 5 .035 100 SIM1 TP (CATHETERS) ×3 IMPLANT
CATH G 5FX100 (CATHETERS) ×3 IMPLANT
CATH SIM1 5FR 125CM (CATHETERS) ×3 IMPLANT
CATH VTK 5FR 125CM BEACON TIP (CATHETERS) ×3 IMPLANT
COVER PROBE U/S 5X48 (MISCELLANEOUS) ×3 IMPLANT
DEVICE EMBOSHIELD NAV6 4.0-7.0 (FILTER) IMPLANT
DEVICE PRESTO INFLATION (MISCELLANEOUS) ×6 IMPLANT
DEVICE STARCLOSE SE CLOSURE (Vascular Products) ×3 IMPLANT
DEVICE TORQUE .025-.038 (MISCELLANEOUS) ×3 IMPLANT
GLIDECATH ANGLED 4FR 120CM (CATHETERS) ×3 IMPLANT
GLIDEWIRE ANGLED SS 035X260CM (WIRE) ×3 IMPLANT
KIT CAROTID MANIFOLD (MISCELLANEOUS) ×3 IMPLANT
NEEDLE ENTRY 21GA 7CM ECHOTIP (NEEDLE) ×3 IMPLANT
PACK ANGIOGRAPHY (CUSTOM PROCEDURE TRAY) ×3 IMPLANT
SET INTRO CAPELLA COAXIAL (SET/KITS/TRAYS/PACK) ×6 IMPLANT
SHEATH BRITE TIP 5FRX11 (SHEATH) ×3 IMPLANT
SHEATH PINNACLE ST 6F 65CM (SHEATH) ×3 IMPLANT
SHEATH SHUTTLE 6FRX80 (SHEATH) ×3 IMPLANT
SHEATH SHUTTLE SELECT 6F (SHEATH) ×3 IMPLANT
SYR MEDRAD MARK 7 150ML (SYRINGE) ×3 IMPLANT
VALVE CHECKFLO PERFORMER (SHEATH) ×3 IMPLANT
WIRE G VAS 035X260 STIFF (WIRE) ×3 IMPLANT
WIRE J 3MM .035X145CM (WIRE) ×3 IMPLANT

## 2019-12-04 NOTE — Progress Notes (Addendum)
Pt transferred to room 134. After transfer from stretcher to bed site assessed at 1100 to find moderate size hematoma. Pressure held for 20 minutes. Site soft intact, some bruising at site. Report provided to [facility] on 1A, site assessed together. Message sent to Dr. Delana Meyer to update, awaiting response from MD.

## 2019-12-04 NOTE — Progress Notes (Signed)
Call back received from Dr. Delana Meyer. Per MD to remain flat until 130 pm and remain on bedrest for a full 4 hrs ending at 330 pm. Beside RN Gerald Stabs updated.

## 2019-12-04 NOTE — Progress Notes (Signed)
PT Cancellation Note  Patient Details Name: Toni White MRN: [id number] DOB: April 22, 1936   Cancelled Treatment:    Reason Eval/Treat Not Completed: Medical issues which prohibited therapy. Patient had procedure this morning and per MD she is on bedrest until 3:30 pm. Will re-attempt later if time allows, otherwise tomorrow.    Zakari Couchman 12/04/2019, 12:34 PM

## 2019-12-04 NOTE — Op Note (Signed)
[facility]   OPERATIVE NOTE  DATE: 12/04/2019  PRE-OPERATIVE DIAGNOSIS: 1.  Symptomatic greater than 80% left internal carotid artery stenosis   POST-OPERATIVE DIAGNOSIS: 1.  Symptomatic 50% left internal carotid artery stenosis  PROCEDURE: 1.   Ultrasound Guidance for vascular access right femoral artery 2.   Catheter placement into left common carotid artery from right femoral approach 3.   Thoracic aortogram 4.   Cervical and cerebral left carotid angiograms 5.   StarClose closure device right femoral artery  SURGEON: Hortencia Pilar, MD  ASSISTANT(S): Dr. Corene Cornea dew  ANESTHESIA: Moderate conscious sedation  ESTIMATED BLOOD LOSS: 50  FLUORO TIME: 20.3 minutes  CONTRAST: 50 cc Visipaque  MODERATE CONSCIOUS SEDATION TIME: Approximately 90 minutes using combination of parenteral Versed and Fentanyl.  Continuous ECG pulse oximetry and cardiopulmonary monitoring was performed by the interventional radiology nurse.  FINDING(S): 1.  Patient has a 50% stenosis of the origin of the left internal carotid artery.  There is also a 40 to 50% stenosis at the origin of the left common carotid artery.  Bovine arch anatomy is also noted.  SPECIMEN(S):  None  INDICATIONS:   Patient is a 84 y.o.female who presents with aphasia.  CT angio suggested greater than 80% stenosis of the left internal carotid artery.  CT angio also is reported as wide patency with minimal atherosclerotic changes at the origin of the left common carotid artery.  Given these findings and the patient's comorbidities carotid stenting was recommended.  The patient is brought to the interventional lab for diagnostic angiography verification of the critical stenosis reported by CT and then if reasonable intervention.  Risks and benefits are discussed and informed consent was obtained.  DESCRIPTION: After obtaining full informed written consent, the patient was brought back to the operating room and  placed supine upon the vascular suite table.  After obtaining adequate anesthesia, the patient was prepped and draped in the standard fashion.  Moderate conscious sedation was administered during a face to face encounter with the patient throughout the procedure with my supervision of the RN administering medicines and monitoring the patients vital signs and mental status throughout from the start of the procedure until the patient was taken to the recovery room.  The right femoral artery was visualized with ultrasound and found to be calcific but patent. It was then accessed under direct ultrasound guidance without difficulty with a micropuncture needle.  A microwire was then advanced without difficulty followed by a microsheath.  A J-wire and 5 French sheath were placed and a permanent image was recorded. The patient was given 3000 units of intravenous heparin.   A pigtail catheter was placed into the ascending aorta and an LAO projection thoracic aortogram was performed. This showed bovine arch anatomy with 40 to 50% ostial common carotid stenosis on the left.  I then selected a VS 1 catheter and cannulated the left common carotid artery advancing the Simmons to the full length of its curve positioned with the tip in the proximal one third of the left common carotid artery.  Hand-injection contrast was then performed which demonstrated a densely calcific lesion but in this image there was question as to whether this lesion was sufficiently stenotic to warrant stenting.  Because we were planning for intervention I elected to move forward with positioning of the shuttle sheath and then obtain imaging in multiple views as is standard for carotid intervention.  This would also allow for more optimal imaging of the carotid bifurcation.  Throughout this portion of the procedure several different wires and catheters were utilized as were several different sheaths however given the bovine anatomy ostial stenosis  and tortuosity of the arch I was not able to advance the sheath into the distal common carotid artery.  I therefore elected to once again placed the [facility] 1 catheter and perform selective angiography from this position.  I then obtained multiple images in both LAO RAO lateral and AP views of the left carotid bifurcation.  Intracranial views were also obtained in both the lateral and [place].  At best I was able to demonstrate a 50% stenosis of the left internal carotid artery at its origin and therefore I elected to terminate the procedure I do not believe stenting is warranted at this time.  The patient will be continued on dual antiplatelet therapy.  The sheath was pulled back into the right external iliac artery and the oblique view was obtained Star close device was deployed there were no immediate complications.  Interpretation: There is a type II arch with moderate atherosclerotic changes.  Bovine arch anatomy is noted.  As described above there is a 40 to 50% stenosis at the origin of the left common carotid.  There is a 50% stenosis at the origin of the left internal carotid artery with a densely calcified lesion.  There is moderate stenosis of the left external iliac artery.  The distal internal carotid artery demonstrates marked tortuosity with mild to moderate atherosclerotic changes but there are no hemodynamically significant stenoses.  Intracranial views demonstrate diffuse mild to moderate disease but no critical lesions are identified.  COMPLICATIONS: None  CONDITION: Stable   Hortencia Pilar 12/04/2019 9:50 AM   This note was created with Dragon Medical transcription system. Any errors in dictation are purely unintentional.

## 2019-12-04 NOTE — Progress Notes (Signed)
Pt Neuro intact, equal grip, equal smile, motor strength  +4 in all extremities. Alert oriented x 4.

## 2019-12-04 NOTE — Progress Notes (Signed)
[facility] Cardiology  Patient Description: Ms. Toni White is a 84 year old female with PMH significant for severe COPD on chronic supplemental O2 at 2L via [place], HTN, dyslipidemia, DM Type II, TIA and basilar aneurysm who was admitted for TIA and found to have significant stenosis of the left ICA on CTA.   Significant events: 12/04/19 Patient underwent cervical and cerebral left carotid angiograms. Stenting was not warranted at this time per Vascular Surgery.   SUBJECTIVE: Today the patient reports to be doing fairly well and denies any complaints at this time. The patient continues to report improvement of her slurred speech and denies any other neurological or cardiac symptoms such as unilateral weakness, numbness, chest pain or palpitations. The patient's daughter is at the bedside who states that the patients does continue to have occasional moments of slurred speech, but it has significantly improved since admission.   12/03/19 The patient reports to be feeling much better on today and denies any cardiac complaints at this time. She reports that the slurred speech has almost completely resolved and she denies any further stroke-like symptoms at this time. The patient states that her dyspnea remains at her baseline, but she has not gotten out of the bed today and would like to do so.   OBJECTIVE: The patient appears well on today and does not have any signs of acute distress. She is lying flat as she come back from the Vascular procedure. The right femoral site appears clean, dry and without erythema or hematoma development and the transparent occlusive dressing remains in place.   12/03/19 The patient appears to be much improvement since yesterday. She has normal effort of breathing and is able to maintain her oxygen saturations on her normal chronic oxygen therapy at 2L via [place]. She is AOx4, her cranial nerves II-XI are grossly intact and there was not any slurred speech upon examination today.   Vitals:   12/04/19 1015 12/04/19 1030 12/04/19 1115 12/04/19 1222  BP: (!) 154/64 133/83 129/69 (!) 123/59  Pulse: 76 79 83 86  Resp: 19 (!) 26 17 16   Temp:   98.3 F (36.8 C) 98.2 F (36.8 C)  TempSrc:   Oral Oral  SpO2: 99% 100% 100% 98%  Weight:      Height:         Intake/Output Summary (Last 24 hours) at 12/04/2019 1310 Last data filed at 12/04/2019 0400 Gross per 24 hour  Intake 581.52 ml  Output 2 ml  Net 579.52 ml      PHYSICAL EXAM  General: Well developed, well nourished, in no acute distress HEENT:  Normocephalic and atramatic Neck:  No JVD. Supple, +2 carotid with bruit  Lungs: expiratory wheezes bilaterally to auscultation Heart: HRRR . Normal S1 and S2 without gallops or murmurs.  Abdomen: Bowel sounds are positive, abdomen soft and non-tender  Msk:  Normal strength and tone for age. Extremities: moderate non-pitting BLE edema. No clubbing or cyanosis   Neuro: Alert and oriented X 3. Psych:  Good affect, responds appropriately   LABS: Basic Metabolic Panel: Recent Labs    12/01/19 1950 12/04/19 0642  NA 143 146*  K 3.6 3.2*  CL 96* 105  CO2 35* 31  GLUCOSE 100* 92  BUN 24* 18  CREATININE 0.91 0.68  CALCIUM 9.7 8.5*  MG  --  1.8   Liver Function Tests: Recent Labs    12/01/19 1950  AST 27  ALT 35  ALKPHOS 55  BILITOT 0.7  PROT 7.1  ALBUMIN 3.6  No results for input(s): LIPASE, AMYLASE in the last 72 hours. CBC: Recent Labs    12/01/19 1950 12/04/19 0642  WBC 15.3* 11.5*  NEUTROABS 11.8*  --   HGB 14.9 13.2  HCT 45.2 41.5  MCV 89.2 90.8  PLT 309 231   Cardiac Enzymes: No results for input(s): CKTOTAL, CKMB, CKMBINDEX, TROPONINI in the last 72 hours. BNP: Invalid input(s): POCBNP D-Dimer: No results for input(s): DDIMER in the last 72 hours. Hemoglobin A1C: Recent Labs    12/02/19 0544  HGBA1C 6.0*   Fasting Lipid Panel: Recent Labs    12/02/19 0544  CHOL 179  HDL 54  LDLCALC 99  TRIG 130  CHOLHDL 3.3   Thyroid  Function Tests: Recent Labs    12/02/19 1415  TSH 3.061   Anemia Panel: Recent Labs    12/02/19 1415  VITAMINB12 331  FOLATE 35.0    CT ANGIO HEAD W OR WO CONTRAST  Result Date: 12/02/2019 CLINICAL DATA:  Carotid artery stenosis. Recent slurred speech and mental status changes. Basilar tip aneurysm. Left carotid stenosis by ultrasound. EXAM: CT ANGIOGRAPHY HEAD AND NECK TECHNIQUE: Multidetector CT imaging of the head and neck was performed using the standard protocol during bolus administration of intravenous contrast. Multiplanar CT image reconstructions and MIPs were obtained to evaluate the vascular anatomy. Carotid stenosis measurements (when applicable) are obtained utilizing NASCET criteria, using the distal internal carotid diameter as the denominator. CONTRAST:  82mL OMNIPAQUE IOHEXOL 350 MG/ML SOLN COMPARISON:  MRI same day. Ultrasound same day. MRI 08/26/2013. CT angiography 08/19/2013. FINDINGS: CT HEAD FINDINGS Brain: Brain atrophy with chronic small-vessel ischemic changes affecting the pons, thalami, basal ganglia and hemispheric Nola Botkins matter. No sign of acute infarction, mass lesion, hemorrhage, hydrocephalus or extra-axial collection. Vascular: There is atherosclerotic calcification of the major vessels at the base of the brain. 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 See below. Skull: Negative Sinuses: Clear sinuses. Orbits: Lens calcification on the right. Review of the MIP images confirms the above findings CTA NECK FINDINGS Aortic arch: Aortic atherosclerotic calcification. No aneurysm or dissection. Branching pattern is normal without flow limiting stenosis. Right carotid system: Common carotid artery widely patent to the bifurcation and ICA bulb. Somewhat irregular soft and calcified plaque affecting the ICA bulb with minimal diameter of 3.4 mm. Compared to a more distal cervical ICA diameter of 5 mm, this indicates a 30% stenosis. Vessels are tortuous, swinging to the midline behind  the oropharynx. Left carotid system: Common carotid artery is patent to the bifurcation. Severe irregular calcified plaque at the carotid bifurcation and ICA bulb. Luminal stenosis likely 1 mm. Compared to a more distal cervical ICA diameter of 5 mm, this indicates an 80% stenosis. Vessels are tortuous, swinging to the midline behind the oropharynx. Vertebral arteries: Calcified plaque at both vertebral artery origin regions but without stenosis greater than 30%. Scattered areas of calcified plaque through the cervical course of both vertebral arteries but no stenosis greater than 30%. Skeleton: Degenerative cervical spondylosis. Other neck: No mass or lymphadenopathy. Upper chest: Emphysema and pulmonary scarring at the apices. Review of the MIP images confirms the above findings CTA HEAD FINDINGS Anterior circulation: Both internal carotid arteries are patent through the skull base and siphon regions. Ordinary siphon atherosclerotic calcification but without stenosis greater than 30%. Anterior and middle cerebral vessels are patent without large or medium vessel occlusion, aneurysm or vascular malformation. Posterior circulation: Both vertebral arteries are patent through the foramen magnum. There is atherosclerotic disease at both vertebral artery V4  segments with stenosis estimated at 50% on both sides. Both vessels supply the basilar. No basilar stenosis. 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 Venous sinuses: Patent and normal. Anatomic variants: None significant. Review of the MIP images confirms the above findings IMPRESSION: No acute large or medium vessel occlusion. Aortic Atherosclerosis (ICD10-I70.0) and Emphysema (ICD10-J43.9). Atherosclerotic disease at both carotid bifurcation and ICA bulb regions. 30% ICA stenosis on the right. 80% or greater irregular stenosis of the ICA bulb on the left. Vessels are tortuous, swinging to the midline behind the  oropharynx. Atherosclerotic disease of both vertebral artery V4 segments with stenoses estimated at 50%. 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 Electronically Signed   By: Nelson Chimes M.D.   On: 12/02/2019 15:56   CT ANGIO NECK W OR WO CONTRAST  Result Date: 12/02/2019 CLINICAL DATA:  Carotid artery stenosis. Recent slurred speech and mental status changes. Basilar tip aneurysm. Left carotid stenosis by ultrasound. EXAM: CT ANGIOGRAPHY HEAD AND NECK TECHNIQUE: Multidetector CT imaging of the head and neck was performed using the standard protocol during bolus administration of intravenous contrast. Multiplanar CT image reconstructions and MIPs were obtained to evaluate the vascular anatomy. Carotid stenosis measurements (when applicable) are obtained utilizing NASCET criteria, using the distal internal carotid diameter as the denominator. CONTRAST:  72mL OMNIPAQUE IOHEXOL 350 MG/ML SOLN COMPARISON:  MRI same day. Ultrasound same day. MRI 08/26/2013. CT angiography 08/19/2013. FINDINGS: CT HEAD FINDINGS Brain: Brain atrophy with chronic small-vessel ischemic changes affecting the pons, thalami, basal ganglia and hemispheric Nari Vannatter matter. No sign of acute infarction, mass lesion, hemorrhage, hydrocephalus or extra-axial collection. Vascular: There is atherosclerotic calcification of the major vessels at the base of the brain. 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 See below. Skull: Negative Sinuses: Clear sinuses. Orbits: Lens calcification on the right. Review of the MIP images confirms the above findings CTA NECK FINDINGS Aortic arch: Aortic atherosclerotic calcification. No aneurysm or dissection. Branching pattern is normal without flow limiting stenosis. Right carotid system: Common carotid artery widely patent to the bifurcation and ICA bulb. Somewhat irregular soft and calcified plaque affecting the ICA bulb with minimal diameter of 3.4  mm. Compared to a more distal cervical ICA diameter of 5 mm, this indicates a 30% stenosis. Vessels are tortuous, swinging to the midline behind the oropharynx. Left carotid system: Common carotid artery is patent to the bifurcation. Severe irregular calcified plaque at the carotid bifurcation and ICA bulb. Luminal stenosis likely 1 mm. Compared to a more distal cervical ICA diameter of 5 mm, this indicates an 80% stenosis. Vessels are tortuous, swinging to the midline behind the oropharynx. Vertebral arteries: Calcified plaque at both vertebral artery origin regions but without stenosis greater than 30%. Scattered areas of calcified plaque through the cervical course of both vertebral arteries but no stenosis greater than 30%. Skeleton: Degenerative cervical spondylosis. Other neck: No mass or lymphadenopathy. Upper chest: Emphysema and pulmonary scarring at the apices. Review of the MIP images confirms the above findings CTA HEAD FINDINGS Anterior circulation: Both internal carotid arteries are patent through the skull base and siphon regions. Ordinary siphon atherosclerotic calcification but without stenosis greater than 30%. Anterior and middle cerebral vessels are patent without large or medium vessel occlusion, aneurysm or vascular malformation. Posterior circulation: Both vertebral arteries are patent through the foramen magnum. There is atherosclerotic disease at both vertebral artery  V4 segments with stenosis estimated at 50% on both sides. Both vessels supply the basilar. No basilar stenosis. 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 Venous sinuses: Patent and normal. Anatomic variants: None significant. Review of the MIP images confirms the above findings IMPRESSION: No acute large or medium vessel occlusion. Aortic Atherosclerosis (ICD10-I70.0) and Emphysema (ICD10-J43.9). Atherosclerotic disease at both carotid bifurcation and ICA bulb regions. 30% ICA  stenosis on the right. 80% or greater irregular stenosis of the ICA bulb on the left. Vessels are tortuous, swinging to the midline behind the oropharynx. Atherosclerotic disease of both vertebral artery V4 segments with stenoses estimated at 50%. 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 Electronically Signed   By: Nelson Chimes M.D.   On: 12/02/2019 15:56   PERIPHERAL VASCULAR CATHETERIZATION  Result Date: 12/04/2019 See op note  ECHOCARDIOGRAM COMPLETE  Result Date: 12/03/2019    ECHOCARDIOGRAM REPORT   Patient Name:   Toni White Date of Exam: 12/02/2019 Medical Rec #:  [id number]     Height:       61.0 in Accession #:    [id number]    Weight:       169.0 lb Date of Birth:  [birth year]      BSA:          1.758 m Patient Age:    9 years      BP:           156/69 mmHg Patient Gender: F             HR:           70 bpm. Exam Location:  [facility] Procedure: 2D Echo, Cardiac Doppler and Color Doppler Indications:     TIA 435.9  History:         Patient has no prior history of Echocardiogram examinations.                  COPD; Risk Factors:Hypertension.  Sonographer:     Sherrie Sport RDCS (AE) Referring Phys:  [id number] Arvella Merles MANSY Diagnosing Phys: Yolonda Kida MD IMPRESSIONS  1. Left ventricular ejection fraction, by estimation, is 65 to 70%. The left ventricle has normal function. The left ventricle has no regional wall motion abnormalities. There is mild concentric left ventricular hypertrophy. Left ventricular diastolic parameters are consistent with Grade I diastolic dysfunction (impaired relaxation).  2. Right ventricular systolic function is moderately reduced. The right ventricular size is moderately enlarged. Mildly increased right ventricular wall thickness. There is normal pulmonary artery systolic pressure.  3. The mitral valve is normal in structure. No evidence of mitral valve regurgitation.  4. The aortic valve is grossly normal.  Aortic valve regurgitation is not visualized. FINDINGS  Left Ventricle: Left ventricular ejection fraction, by estimation, is 65 to 70%. The left ventricle has normal function. The left ventricle has no regional wall motion abnormalities. The left ventricular internal cavity size was normal in size. There is  mild concentric left ventricular hypertrophy. Left ventricular diastolic parameters are consistent with Grade I diastolic dysfunction (impaired relaxation). Right Ventricle: The right ventricular size is moderately enlarged. Mildly increased right ventricular wall thickness. Right ventricular systolic function is moderately reduced. There is normal pulmonary artery systolic pressure. The tricuspid regurgitant velocity is 1.81 m/s, and with an assumed right atrial pressure of 10 mmHg, the estimated right ventricular systolic  pressure is 23.1 mmHg. Left Atrium: Left atrial size was normal in size. Right Atrium: Right atrial size was normal in size. Pericardium: Trivial pericardial effusion is present. Mitral Valve: The mitral valve is normal in structure. No evidence of mitral valve regurgitation. Tricuspid Valve: The tricuspid valve is normal in structure. Tricuspid valve regurgitation is trivial. Aortic Valve: The aortic valve is grossly normal. Aortic valve regurgitation is not visualized. Aortic valve mean gradient measures 3.0 mmHg. Aortic valve peak gradient measures 5.5 mmHg. Aortic valve area, by VTI measures 3.64 cm. Pulmonic Valve: The pulmonic valve was grossly normal. Pulmonic valve regurgitation is not visualized. Aorta: The aortic root is normal in size and structure. IAS/Shunts: No atrial level shunt detected by color flow Doppler.  LEFT VENTRICLE PLAX 2D LVIDd:         3.50 cm  Diastology LVIDs:         2.14 cm  LV e' lateral:   6.42 cm/s LV PW:         0.91 cm  LV E/e' lateral: 16.2 LV IVS:        1.21 cm  LV e' medial:    6.31 cm/s LVOT diam:     2.00 cm  LV E/e' medial:  16.5 LV SV:         82  LV SV Index:   47 LVOT Area:     3.14 cm  RIGHT VENTRICLE RV Basal diam:  2.26 cm LEFT ATRIUM             Index       RIGHT ATRIUM           Index LA diam:        3.40 cm 1.93 cm/m  RA Area:     11.70 cm LA Vol (A2C):   33.6 ml 19.11 ml/m RA Volume:   23.20 ml  13.19 ml/m LA Vol (A4C):   33.9 ml 19.28 ml/m LA Biplane Vol: 33.6 ml 19.11 ml/m  AORTIC VALVE                   PULMONIC VALVE AV Area (Vmax):    3.11 cm    PV Vmax:       0.70 m/s AV Area (Vmean):   3.72 cm    PV Peak grad:  2.0 mmHg AV Area (VTI):     3.64 cm AV Vmax:           117.00 cm/s AV Vmean:          79.400 cm/s AV VTI:            0.225 m AV Peak Grad:      5.5 mmHg AV Mean Grad:      3.0 mmHg LVOT Vmax:         116.00 cm/s LVOT Vmean:        93.900 cm/s LVOT VTI:          0.261 m LVOT/AV VTI ratio: 1.16  AORTA Ao Root diam: 3.10 cm MITRAL VALVE                TRICUSPID VALVE MV Area (PHT): 2.18 cm     TR Peak grad:   13.1 mmHg MV Decel Time: 348 msec     TR Vmax:        181.00 cm/s MV E velocity: 104.00 cm/s MV A velocity: 135.00 cm/s  SHUNTS MV E/A ratio:  0.77         Systemic VTI:  0.26 m                             Systemic Diam: 2.00 cm Yolonda Kida MD Electronically signed by Yolonda Kida MD Signature Date/Time: 12/03/2019/6:39:32 AM    Final      Echo: normal LV systolic function with EF of 65-70% estimated; Grade I diastolic dysfunction.   TELEMETRY: NSR. HR=89  ASSESSMENT AND PLAN:  Active Problems:   Hypertension   CVA (cerebral vascular accident) ([place])   Aphasia   Dysarthria   Carotid stenosis, left   Chronic respiratory failure with hypoxia (HCC)   TIA (transient ischemic attack)    1. Slurred speech likely due to TIA, resolved, left ICA stenosis of 50% confirmed by carotid angiogram, no stent needed at this time          -Echocardiogram unremarkable               -Agree with dual antiplatelet therapy at this time              -Neurology consult appreciated             -Vascular Surgery  consult appreciated             -Agree with current plan             -Recommend continuous telemetry monitoring.  2. Hypertensive Urgency, reasonably stable at this time             -We will continue management with Amlodipine, metoprolol and Iv labetalol as needed.             -Recommend q4h bp monitoring and heart healthy diet  3. COPD, reasonably stable              -Agree with current management.             -Recommend RT consult  4. Dyslipidemia, reasonably controlled             -We will continue statin therapy          Wilmont Olund, ACNPC-AG  12/04/2019 1:10 PM

## 2019-12-05 ENCOUNTER — Inpatient Hospital Stay: Payer: Medicare HMO

## 2019-12-05 DIAGNOSIS — J9611 Chronic respiratory failure with hypoxia: Secondary | ICD-10-CM

## 2019-12-05 DIAGNOSIS — R0602 Shortness of breath: Secondary | ICD-10-CM

## 2019-12-05 DIAGNOSIS — I639 Cerebral infarction, unspecified: Secondary | ICD-10-CM

## 2019-12-05 DIAGNOSIS — D72829 Elevated white blood cell count, unspecified: Secondary | ICD-10-CM

## 2019-12-05 DIAGNOSIS — G459 Transient cerebral ischemic attack, unspecified: Principal | ICD-10-CM

## 2019-12-05 LAB — BASIC METABOLIC PANEL
Anion gap: 9 (ref 5–15)
BUN: 22 mg/dL (ref 8–23)
CO2: 29 mmol/L (ref 22–32)
Calcium: 8.4 mg/dL — ABNORMAL LOW (ref 8.9–10.3)
Chloride: 102 mmol/L (ref 98–111)
Creatinine, Ser: 0.78 mg/dL (ref 0.44–1.00)
GFR calc Af Amer: >60 mL/min (ref >60)
GFR calc non Af Amer: >60 mL/min (ref >60)
Glucose, Bld: 114 mg/dL — ABNORMAL HIGH (ref 70–99)
Potassium: 3.3 mmol/L — ABNORMAL LOW (ref 3.5–5.1)
Sodium: 140 mmol/L (ref 135–145)

## 2019-12-05 LAB — CBC
HCT: 34.7 % — ABNORMAL LOW (ref 36.0–46.0)
Hemoglobin: 11.4 g/dL — ABNORMAL LOW (ref 12.0–15.0)
MCH: 29.6 pg (ref 26.0–34.0)
MCHC: 32.9 g/dL (ref 30.0–36.0)
MCV: 90.1 fL (ref 80.0–100.0)
Platelets: 257 10*3/uL (ref 150–400)
RBC: 3.85 MIL/uL — ABNORMAL LOW (ref 3.87–5.11)
RDW: 12.3 % (ref 11.5–15.5)
WBC: 22.2 10*3/uL — ABNORMAL HIGH (ref 4.0–10.5)
nRBC: 0 % (ref 0.0–0.2)

## 2019-12-05 MED ORDER — IPRATROPIUM-ALBUTEROL 0.5-2.5 (3) MG/3ML IN SOLN
3.0000 mL | Freq: Four times a day (QID) | RESPIRATORY_TRACT | Status: DC
  Administered 2019-12-05 – 2019-12-06 (×5): 3 mL via RESPIRATORY_TRACT
  Filled 2019-12-05 (×5): qty 3

## 2019-12-05 MED ORDER — AMLODIPINE BESYLATE 5 MG PO TABS
5.0000 mg | ORAL_TABLET | Freq: Every day | ORAL | Status: DC
  Administered 2019-12-05 – 2019-12-06 (×2): 5 mg via ORAL
  Filled 2019-12-05 (×2): qty 1

## 2019-12-05 MED ORDER — POTASSIUM CHLORIDE CRYS ER 20 MEQ PO TBCR
40.0000 meq | EXTENDED_RELEASE_TABLET | Freq: Once | ORAL | Status: AC
  Administered 2019-12-05: 11:00:00 40 meq via ORAL
  Filled 2019-12-05: qty 2

## 2019-12-05 NOTE — Progress Notes (Signed)
[facility] Daily Progress Note   Subjective: 1.   Ultrasound Guidance for vascular access right femoral artery 2.   Catheter placement into left common carotid artery from right femoral approach 3.   Thoracic aortogram 4.   Cervical and cerebral left carotid angiograms 5.   StarClose closure device right femoral artery  Patient without complaint.   Objective: Vitals:   12/04/19 2344 12/05/19 0745 12/05/19 0933 12/05/19 1151  BP: (!) 147/58 (!) 141/64 (!) 147/60   Pulse: 86 89 94 93  Resp: 20 18 18 16   Temp: 98 F (36.7 C) 98.6 F (37 C) 98.4 F (36.9 C)   TempSrc: Oral Oral Oral   SpO2: 99% 98% 99% 97%  Weight:      Height:        Intake/Output Summary (Last 24 hours) at 12/05/2019 1358 Last data filed at 12/04/2019 1859 Gross per 24 hour  Intake 438.45 ml  Output -  Net 438.45 ml   Physical Exam: A&Ox1, NAD CV: RRR Pulmonary: CTA Bilaterally Abdomen: Soft, Nontender, Nondistended Right Groin:  Access site: Soft, some ecchymosis noted.  No pulsatile mass.  Nontender to palpation. Vascular: Warm distally to toes.   Laboratory: CBC    Component Value Date/Time   WBC 22.2 (H) 12/05/2019 0902   HGB 11.4 (L) 12/05/2019 0902   HGB 15.4 12/11/2017 0950   HCT 34.7 (L) 12/05/2019 0902   HCT 47.2 (H) 12/11/2017 0950   PLT 257 12/05/2019 0902   PLT 272 12/11/2017 0950   BMET    Component Value Date/Time   NA 140 12/05/2019 0902   NA 141 04/23/2019 1007   NA 141 01/27/2014 0551   K 3.3 (L) 12/05/2019 0902   K 4.3 01/27/2014 0551   CL 102 12/05/2019 0902   CL 103 01/27/2014 0551   CO2 29 12/05/2019 0902   CO2 33 (H) 01/27/2014 0551   GLUCOSE 114 (H) 12/05/2019 0902   GLUCOSE 139 (H) 01/27/2014 0551   BUN 22 12/05/2019 0902   BUN 22 04/23/2019 1007   BUN 21 (H) 01/27/2014 0551   CREATININE 0.78 12/05/2019 0902   CREATININE 0.91 05/02/2017 0854   CALCIUM 8.4 (L) 12/05/2019 0902   CALCIUM 10.5 (H) 01/27/2014 0551   GFRNONAA >60  12/05/2019 0902   GFRNONAA 60 05/02/2017 0854   GFRAA >60 12/05/2019 0902   GFRAA 70 05/02/2017 0854   Assessment/Planning: The patient is an 84 year old female who presented to the hospital with slurred speech concerning for TIA found to have left carotid artery disease on CTA  1) Carotid Stenosis: - CT angio suggested greater than 80% stenosis of the left internal carotid artery. Carotid Angiogram Findings: - There is a 50% stenosis at the origin of the left internal carotid artery with a densely calcified lesion. No need for stent placement.  -Agree with the continuation of aspirin and Plavix for dual antiplatelet therapy.  -Continue statin for medical management.  -Wll continue to follow in the outpatient setting.  2) Right Groin Access Site: - No active bleeding noted on exam this afternoon.  Discussed with Dr. Eber Hong Armonte Tortorella PA-C 12/05/2019 1:58 PM

## 2019-12-05 NOTE — Progress Notes (Signed)
Physical Therapy Treatment Patient Details Name: Toni White MRN: [id number] DOB: [birth year] Today's Date: 12/05/2019    History of Present Illness Toni White is a 84 y/o F who presented to ER secondary to acute onset of slurred speech, HA, nausea; admitted for TIA/CVA work up. MRI negative for acute infarct. PMH:  includes CRF on 2L O2 at all times at home. Pt underwent Left angiograp (vertebral, cerebral), aortagram on 4/29.    PT Comments    Pt in bed upon entry, agreeable to participate. Vascular surgery exiting at entry. DTR at bedside. Pt on 2L/min via [place] as per baseline flow rate. Resting HR in 90s bpm. AMB met with SOB linear progression with activity in time, does nto correlate with SpO2, but does correlate with HR which is 110s after 49f and 130s after 948f Pt reports to feel limited compared to baseline, DTR agrees. Educated DTR on safe limitation of AMB time/distance at DC based on pt symptoms first, but moreover what they are reflecting regarding HR. Pt would benefit from continued PT services to advance stamina and tolerance to AMB to improve safety in household mobility upon return to home.     Follow Up Recommendations  Home health PT     Equipment Recommendations  Rolling walker with 5" wheels;3in1 (PT)    Recommendations for Other Services       Precautions / Restrictions Precautions Precautions: Fall Restrictions Weight Bearing Restrictions: No    Mobility  Bed Mobility Overal bed mobility: Needs Assistance Bed Mobility: Supine to Sit     Supine to sit: Max assist     General bed mobility comments: Pt exhausted after 30sec attempt (mostly scottling laterally to Right EOB)  Transfers Overall transfer level: Needs assistance Equipment used: Rolling walker (2 wheeled) Transfers: Sit to/from Stand Sit to Stand: Mod assist         General transfer comment: heavy effort, heavy UE use required  Ambulation/Gait Ambulation/Gait assistance: Min  guard Gait Distance (Feet): 90 Feet(6074fret, then 36f64f 2L/min: spO2 96% adn 94% respectively. HR 119bpm and 131 respectively.) Assistive device: Rolling walker (2 wheeled) Gait Pattern/deviations: Step-to pattern         Stairs             Wheelchair Mobility    Modified Rankin (Stroke Patients Only)       Balance Overall balance assessment: Needs assistance Sitting-balance support: Single extremity supported;Feet supported Sitting balance-Leahy Scale: Good     Standing balance support: Bilateral upper extremity supported;During functional activity Standing balance-Leahy Scale: Fair                              Cognition Arousal/Alertness: Awake/alert Behavior During Therapy: WFL for tasks assessed/performed Overall Cognitive Status: Within Functional Limits for tasks assessed                                        Exercises Other Exercises Other Exercises: STS c RW from elevated surface, minGuard assist x4, various techniques used Other Exercises: AMB training in room 60ft26fen 36ft.65fGeneral Comments        Pertinent Vitals/Pain Pain Assessment: No/denies pain(intermittent gas pains)    Home Living                      Prior Function  PT Goals (current goals can now be found in the care plan section) Acute Rehab PT Goals Patient Stated Goal: to return home PT Goal Formulation: With patient Time For Goal Achievement: 12/16/19 Potential to Achieve Goals: Fair Progress towards PT goals: Progressing toward goals    Frequency    Min 2X/week      PT Plan Current plan remains appropriate    Co-evaluation              AM-PAC PT "6 Clicks" Mobility   Outcome Measure  Help needed turning from your back to your side while in a flat bed without using bedrails?: A Lot Help needed moving from lying on your back to sitting on the side of a flat bed without using bedrails?: A Lot Help  needed moving to and from a bed to a chair (including a wheelchair)?: A Lot Help needed standing up from a chair using your arms (e.g., wheelchair or bedside chair)?: A Lot Help needed to walk in hospital room?: A Little Help needed climbing 3-5 steps with a railing? : A Lot 6 Click Score: 13    End of Session Equipment Utilized During Treatment: Gait belt;Oxygen Activity Tolerance: Patient tolerated treatment well;No increased pain Patient left: in chair;with call bell/phone within reach;with chair alarm set Nurse Communication: Mobility status PT Visit Diagnosis: Muscle weakness (generalized) (M62.81);Difficulty in walking, not elsewhere classified (R26.2)     Time: 5834-6219 PT Time Calculation (min) (ACUTE ONLY): 33 min  Charges:  $Therapeutic Exercise: 23-37 mins                     2:47 PM, 12/05/19 Etta Grandchild, PT, DPT Physical Therapist - [facility]  [phone] ([place])    [place] 12/05/2019, 2:43 PM

## 2019-12-05 NOTE — Progress Notes (Signed)
[facility] Cardiology  Patient Description: Toni White is a 84 year old female with PMH significant for severe COPD on chronic supplemental O2 at 2L via [place], HTN, dyslipidemia, DM Type II, TIA and basilar aneurysm who was admitted for TIA and found to have significant stenosis of the left ICA on CTA.   Significant events: 12/04/19 Patient underwent cervical and cerebral left carotid angiograms. Stenting was not warranted at this time per Vascular Surgery.   SUBJECTIVE: Today the patient reports to be doing well and feeling slightly better on today. She denies any cardiac complaints at this time. She denies any pain at the procedure site. The patient also denies any worsening slurred speech and all TIA-like symptoms.   12/04/19  the patient reports to be doing fairly well and denies any complaints at this time. The patient continues to report improvement of her slurred speech and denies any other neurological or cardiac symptoms such as unilateral weakness, numbness, chest pain or palpitations. The patient's daughter is at the bedside who states that the patients does continue to have occasional moments of slurred speech, but it has significantly improved since admission.   12/03/19 The patient reports to be feeling much better on today and denies any cardiac complaints at this time. She reports that the slurred speech has almost completely resolved and she denies any further stroke-like symptoms at this time. The patient states that her dyspnea remains at her baseline, but she has not gotten out of the bed today and would like to do so.   OBJECTIVE: Today the patient appears to be feeling much better as she is able to sit up in semi-fowlers position. There is no apparent distress and she has normal effort of breathing. The patient is AOx4, vss and the femoral site continues to be clean, dry and without erythema or hematoma.   12/04/19 The patient appears well on today and does not have any signs of acute distress.  She is lying flat as she come back from the Vascular procedure. The right femoral site appears clean, dry and without erythema or hematoma development and the transparent occlusive dressing remains in place.   12/03/19 The patient appears to be much improvement since yesterday. She has normal effort of breathing and is able to maintain her oxygen saturations on her normal chronic oxygen therapy at 2L via [place]. She is AOx4, her cranial nerves II-XI are grossly intact and there was not any slurred speech upon examination today.   Vitals:   12/04/19 1818 12/04/19 2000 12/04/19 2107 12/04/19 2344  BP: (!) 156/67  140/65 (!) 147/58  Pulse: 89  87 86  Resp: 20  16 20   Temp: 98 F (36.7 C)  98.3 F (36.8 C) 98 F (36.7 C)  TempSrc: Oral  Oral Oral  SpO2: 98% 98% 98% 99%  Weight:      Height:         Intake/Output Summary (Last 24 hours) at 12/05/2019 0744 Last data filed at 12/04/2019 1859 Gross per 24 hour  Intake 438.45 ml  Output --  Net 438.45 ml      PHYSICAL EXAM  General: Well developed, well nourished, in no acute distress HEENT:  Normocephalic and atramatic Neck:  No JVD. Supple, +2 carotid with bruit  Lungs: expiratory wheezes bilaterally to auscultation Heart: HRRR . Normal S1 and S2 without gallops or murmurs.  Abdomen: Bowel sounds are positive, abdomen soft and non-tender  Msk:  Normal strength and tone for age. Extremities: moderate non-pitting BLE edema. No  clubbing or cyanosis   Neuro: Alert and oriented X 3. Psych:  Good affect, responds appropriately   LABS: Basic Metabolic Panel: Recent Labs    12/04/19 0642  NA 146*  K 3.2*  CL 105  CO2 31  GLUCOSE 92  BUN 18  CREATININE 0.68  CALCIUM 8.5*  MG 1.8   Liver Function Tests: No results for input(s): AST, ALT, ALKPHOS, BILITOT, PROT, ALBUMIN in the last 72 hours. No results for input(s): LIPASE, AMYLASE in the last 72 hours. CBC: Recent Labs    12/04/19 0642  WBC 11.5*  HGB 13.2  HCT 41.5  MCV  90.8  PLT 231   Cardiac Enzymes: No results for input(s): CKTOTAL, CKMB, CKMBINDEX, TROPONINI in the last 72 hours. BNP: Invalid input(s): POCBNP D-Dimer: No results for input(s): DDIMER in the last 72 hours. Hemoglobin A1C: No results for input(s): HGBA1C in the last 72 hours. Fasting Lipid Panel: No results for input(s): CHOL, HDL, LDLCALC, TRIG, CHOLHDL, LDLDIRECT in the last 72 hours. Thyroid Function Tests: Recent Labs    12/02/19 1415  TSH 3.061   Anemia Panel: Recent Labs    12/02/19 1415  VITAMINB12 331  FOLATE 35.0    PERIPHERAL VASCULAR CATHETERIZATION  Result Date: 12/04/2019 See op note    Echo: normal LV systolic function with EF of 65-70% estimated; Grade I diastolic dysfunction.   TELEMETRY: NSR. HR=89  ASSESSMENT AND PLAN:  Active Problems:   Hypertension   CVA (cerebral vascular accident) ([place])   Aphasia   Dysarthria   Carotid stenosis, left   Chronic respiratory failure with hypoxia (HCC)   TIA (transient ischemic attack)    1. Slurred speech likely due to TIA, resolved, left ICA stenosis of 50% confirmed by carotid angiogram, no stent needed at this time          -Echocardiogram unremarkable               -Agree with dual antiplatelet therapy at this time              -Neurology consult appreciated             -Vascular Surgery consult appreciated             -Agree with current plan             -Recommend continuous telemetry monitoring and repeating CBC, CMP blood work.   2. Hypertensive Urgency, reasonably stable at this time             -We will continue management with Amlodipine, metoprolol and Iv labetalol as needed.             -Recommend q4h bp monitoring and heart healthy diet  3. COPD, reasonably stable              -Agree with current management.             -Recommend RT consult  4. Dyslipidemia, reasonably controlled             -We will continue statin therapy        Patient is cleared for discharge from a  Cardiac standpoint.      [place], [id number]-AG  12/05/2019 7:44 AM

## 2019-12-05 NOTE — Progress Notes (Addendum)
PROGRESS NOTE    NYALEE White  [id number] DOB: 20-Sep-1935 DOA: 12/01/2019 PCP: Birdie Sons, MD    Brief Narrative:  Toni White  is a 84 y.o. pleasant Caucasian female with a known history of COPD, hypertension and dyslipidemia, who presented to the emergency room with an acute onset of slurred speech that started a couple of days ago without paresthesias or focal muscle weakness    Consultants:   Vascular surgery  Procedures:  Antimicrobials:      Subjective: Has no complaints.  Appears to be mildly short of breath although still at baseline oxygenation requirement 2 L.  Right hematoma appears better  Objective: Vitals:   12/04/19 2344 12/05/19 0745 12/05/19 0933 12/05/19 1151  BP: (!) 147/58 (!) 141/64 (!) 147/60   Pulse: 86 89 94 93  Resp: 20 18 18 16   Temp: 98 F (36.7 C) 98.6 F (37 C) 98.4 F (36.9 C)   TempSrc: Oral Oral Oral   SpO2: 99% 98% 99% 97%  Weight:      Height:        Intake/Output Summary (Last 24 hours) at 12/05/2019 1312 Last data filed at 12/04/2019 1859 Gross per 24 hour  Intake 438.45 ml  Output --  Net 438.45 ml   Filed Weights   12/01/19 1936 12/04/19 0730  Weight: 76.7 kg 76.7 kg    Examination: Gen: comfortable , nad Respiratory system: Clear to auscultation. Respiratory effort normal. Cardiovascular system: S1 & S2 heard, RRR. No JVD, murmurs, rubs, gallops or clicks.  Gastrointestinal system: Abdomen is nondistended, soft and nontender.  Normal bowel sounds heard. Central nervous system: Alert and oriented. No focal neurological deficits. Extremities: No edema, right hematoma has improved.  Ecchymosis in the surrounding.  2+ pedal pulses Skin: warm, dry Psychiatry: Judgement and insight appear normal. Mood & affect appropriate.     Data Reviewed: I have personally reviewed following labs and imaging studies  CBC: Recent Labs  Lab 12/01/19 1950 12/04/19 0642 12/05/19 0902  WBC 15.3* 11.5* 22.2*  NEUTROABS  11.8*  --   --   HGB 14.9 13.2 11.4*  HCT 45.2 41.5 34.7*  MCV 89.2 90.8 90.1  PLT 309 231 [id number]   Basic Metabolic Panel: Recent Labs  Lab 12/01/19 1950 12/04/19 0642 12/05/19 0902  NA 143 146* 140  K 3.6 3.2* 3.3*  CL 96* 105 102  CO2 35* 31 29  GLUCOSE 100* 92 114*  BUN 24* 18 22  CREATININE 0.91 0.68 0.78  CALCIUM 9.7 8.5* 8.4*  MG  --  1.8  --    GFR: Estimated Creatinine Clearance: 51.7 mL/min (by C-G formula based on SCr of 0.78 mg/dL). Liver Function Tests: Recent Labs  Lab 12/01/19 1950  AST 27  ALT 35  ALKPHOS 55  BILITOT 0.7  PROT 7.1  ALBUMIN 3.6   No results for input(s): LIPASE, AMYLASE in the last 168 hours. No results for input(s): AMMONIA in the last 168 hours. Coagulation Profile: Recent Labs  Lab 12/01/19 1950 12/04/19 0642  INR 1.0 1.1   Cardiac Enzymes: No results for input(s): CKTOTAL, CKMB, CKMBINDEX, TROPONINI in the last 168 hours. BNP (last 3 results) No results for input(s): PROBNP in the last 8760 hours. HbA1C: No results for input(s): HGBA1C in the last 72 hours. CBG: Recent Labs  Lab 12/04/19 0717 12/04/19 1018  GLUCAP 83 105*   Lipid Profile: No results for input(s): CHOL, HDL, LDLCALC, TRIG, CHOLHDL, LDLDIRECT in the last 72 hours. Thyroid Function Tests:  Recent Labs    12/02/19 1415  TSH 3.061   Anemia Panel: Recent Labs    12/02/19 1415  VITAMINB12 331  FOLATE 35.0   Sepsis Labs: No results for input(s): PROCALCITON, LATICACIDVEN in the last 168 hours.  Recent Results (from the past 240 hour(s))  Respiratory Panel by RT PCR (Flu A&B, Covid) - Nasopharyngeal Swab     Status: None   Collection Time: 12/02/19 12:19 AM   Specimen: Nasopharyngeal Swab  Result Value Ref Range Status   SARS Coronavirus 2 by RT PCR NEGATIVE NEGATIVE Final    Comment: (NOTE) SARS-CoV-2 target nucleic acids are NOT DETECTED. The SARS-CoV-2 RNA is generally detectable in upper respiratoy specimens during the acute phase of  infection. The lowest concentration of SARS-CoV-2 viral copies this assay can detect is 131 copies/mL. A negative result does not preclude SARS-Cov-2 infection and should not be used as the sole basis for treatment or other patient management decisions. A negative result may occur with  improper specimen collection/handling, submission of specimen other than nasopharyngeal swab, presence of viral mutation(s) within the areas targeted by this assay, and inadequate number of viral copies (<131 copies/mL). A negative result must be combined with clinical observations, patient history, and epidemiological information. The expected result is Negative. Fact Sheet for Patients:  PinkCheek.be Fact Sheet for Healthcare Providers:  GravelBags.it This test is not yet ap proved or cleared by the [place] FDA and  has been authorized for detection and/or diagnosis of SARS-CoV-2 by FDA under an Emergency Use Authorization (EUA). This EUA will remain  in effect (meaning this test can be used) for the duration of the COVID-19 declaration under Section 564(b)(1) of the Act, 21 U.S.C. section 360bbb-3(b)(1), unless the authorization is terminated or revoked sooner.    Influenza A by PCR NEGATIVE NEGATIVE Final   Influenza B by PCR NEGATIVE NEGATIVE Final    Comment: (NOTE) The Xpert Xpress SARS-CoV-2/FLU/RSV assay is intended as an aid in  the diagnosis of influenza from Nasopharyngeal swab specimens and  should not be used as a sole basis for treatment. Nasal washings and  aspirates are unacceptable for Xpert Xpress SARS-CoV-2/FLU/RSV  testing. Fact Sheet for Patients: PinkCheek.be Fact Sheet for Healthcare Providers: GravelBags.it This test is not yet approved or cleared by the [place] FDA and  has been authorized for detection and/or diagnosis of SARS-CoV-2 by  FDA under  an Emergency Use Authorization (EUA). This EUA will remain  in effect (meaning this test can be used) for the duration of the  Covid-19 declaration under Section 564(b)(1) of the Act, 21  U.S.C. section 360bbb-3(b)(1), unless the authorization is  terminated or revoked. Performed at [facility], [address] [place], [place] [address]          Radiology Studies: PERIPHERAL VASCULAR CATHETERIZATION  Result Date: 12/04/2019 See op note  DG Chest Port 1 View  Result Date: 12/05/2019 CLINICAL DATA:  Shortness of breath. EXAM: PORTABLE CHEST 1 VIEW COMPARISON:  12/02/2019 FINDINGS: Heart size remains enlarged. Calcification of the thoracic aorta is similar to the prior study. No consolidation or sign of pleural effusion. Slight increase in density along the paraspinal region likely reflects hiatal hernia. Spinal degenerative changes. No acute or destructive bone process. IMPRESSION: 1. No active cardiopulmonary disease. 2. Stable cardiomegaly and aortic atherosclerosis. Electronically Signed   By: Zetta Bills M.D.   On: 12/05/2019 10:42        Scheduled Meds: . amLODipine  5 mg Oral Daily  .  aspirin EC  81 mg Oral Daily  . clopidogrel  75 mg Oral Daily  . enoxaparin (LOVENOX) injection  40 mg Subcutaneous Q24H  . furosemide  20 mg Oral Daily  . gabapentin  100 mg Oral BID  . ipratropium-albuterol  3 mL Nebulization QID  . levothyroxine  37.5 mcg Oral Q0600  . metoprolol succinate  25 mg Oral Daily  . multivitamin-lutein  1 capsule Oral BID  . pantoprazole  40 mg Oral Daily  . pravastatin  40 mg Oral QHS  . roflumilast  500 mcg Oral Daily   Continuous Infusions:  Assessment & Plan:   Active Problems:   Hypertension   CVA (cerebral vascular accident) [facility])   Aphasia   Dysarthria   Carotid stenosis, left   Chronic respiratory failure with hypoxia (HCC)   TIA (transient ischemic attack)   1. Slurred speech and visual hallucinations concerning for  TIA/left carotid stenosis 80%-- noted on CT angiogram of head and neck - continue her aspirin and add Plavix for dual antiplatelet therapy--per  Neurology recommendaiton -continue statin therapy and check fasting lipids. -MRI brain --no acute CVA,but does show extensive small vessel ischemic changes and evidence of chronic microhemorrhages. These changes have been progressive over time when compared to old imaging.  --seen by  Dr. Doy Mince  -- by speech therapist patient on regular consistency diet. Her speech has improved -physical therapy recommends home health PT. -- Vascular consultation with Dr. Delana Meyer-- carotid artery stenosis, s/p cerebral Lt carotid angiograms, thoracic aortogram, catheter placement into LCA from right femoral approach. No need for stent placement.  -- cardiology consultation with Dr. Fenton Malling cardiac clearance.  Continue dual antiplatelet therapy with aspirin and Plavix   2. Hypertensive urgency. -Has improved -Continue antihypertensive and IV labetalol mildly permissive parameters. -- Increase amlodipine to 5 mg daily Continue beta-blockers  3. Hypothyroidism. -continue Synthroid and check TSH level.  4. COPD. -continue her Spiriva, her Daliresp and as needed albuterol and hold off Advair Diskus.  5.GERD. - continue PPI therapy.  6. Depression/anxiety. - continue Celexa.  7. Dyslipidemia. -continue statin therapy.  8. DVT prophylaxis. -Subcutaneous Lovenox.  9. Leukocytosis- possibly stress induced, 2/2 right groin hematoma post carotid intervention. Afebrile. Will monitor. cxr ordered and unrevealing.     DVT prophylaxis: lovenox Code Status: DNR Family Communication: Called daughter Herbie Saxon but did not pick up Disposition Plan: Back to previous home life  barrier: Need to monitor overnight as she is having leukocytosis, right femoral groin hematoma       LOS: 3 days   Time spent: 45 minutes with more  than 50% COC    Nolberto Hanlon, MD [facility] Pager [phone]  If 7PM-7AM, please contact night-coverage [email or web address] Password [email or web address] 12/05/2019, 1:12 PM Patient ID: SHAMRA SROUFE, female   DOB: 21-Oct-1935, 84 y.o.   MRN: [id number]

## 2019-12-05 NOTE — TOC Progression Note (Signed)
Transition of Care [facility]) - Progression Note    Patient Details  Name: Toni White MRN: [id number] Date of Birth: July 14, 1936  Transition of Care [facility] CM/SW Contact  Zaki Gertsch, Gardiner Rhyme, LCSW Phone Number: 12/05/2019, 3:43 PM  Clinical Narrative:  Met with pt and daughter who is here to see how doing. Pt reports she did PT and feels she did better than she thought she would. Pt is set up with [facility] for HHPT and has a rw, wants to wait on a 3 in 1. Plan to discharge home with female roommate who is also her friend, but he is not able to assist her at home he is wheelchair bound. Await medically readiness for discharge.     Expected Discharge Plan: [place] Barriers to Discharge: Barriers Resolved  Expected Discharge Plan and Services Expected Discharge Plan: [place] In-house Referral: Clinical Social Work   Post Acute Care Choice: [place] arrangements for the past 2 months: [place]: PT [place]: [facility] Date [place]: 12/02/19 Time [place]: [id number] Representative spoke with at [place]: brittany   Social Determinants of Health ([place]) Interventions    Readmission Risk Interventions No flowsheet data found.

## 2019-12-05 NOTE — Care Management Important Message (Signed)
Important Message  Patient Details  Name: Toni White MRN: [id number] Date of Birth: 1936-01-21   Medicare Important Message Given:  Yes     Juliann Pulse A Preston Garabedian 12/05/2019, 11:36 AM

## 2019-12-06 LAB — BASIC METABOLIC PANEL
Anion gap: 8 (ref 5–15)
BUN: 19 mg/dL (ref 8–23)
CO2: 29 mmol/L (ref 22–32)
Calcium: 8.4 mg/dL — ABNORMAL LOW (ref 8.9–10.3)
Chloride: 103 mmol/L (ref 98–111)
Creatinine, Ser: 0.68 mg/dL (ref 0.44–1.00)
GFR calc Af Amer: >60 mL/min (ref >60)
GFR calc non Af Amer: >60 mL/min (ref >60)
Glucose, Bld: 118 mg/dL — ABNORMAL HIGH (ref 70–99)
Potassium: 3.7 mmol/L (ref 3.5–5.1)
Sodium: 140 mmol/L (ref 135–145)

## 2019-12-06 LAB — CBC
HCT: 31.3 % — ABNORMAL LOW (ref 36.0–46.0)
Hemoglobin: 10.2 g/dL — ABNORMAL LOW (ref 12.0–15.0)
MCH: 29.7 pg (ref 26.0–34.0)
MCHC: 32.6 g/dL (ref 30.0–36.0)
MCV: 91.3 fL (ref 80.0–100.0)
Platelets: 225 10*3/uL (ref 150–400)
RBC: 3.43 MIL/uL — ABNORMAL LOW (ref 3.87–5.11)
RDW: 12.4 % (ref 11.5–15.5)
WBC: 17.5 10*3/uL — ABNORMAL HIGH (ref 4.0–10.5)
nRBC: 0 % (ref 0.0–0.2)

## 2019-12-06 LAB — BRAIN NATRIURETIC PEPTIDE: B Natriuretic Peptide: 71 pg/mL (ref 0.0–100.0)

## 2019-12-06 MED ORDER — AMLODIPINE BESYLATE 5 MG PO TABS: 5.0000 mg | ORAL_TABLET | Freq: Every day | ORAL | 0 refills | Status: AC

## 2019-12-06 MED ORDER — CLOPIDOGREL BISULFATE 75 MG PO TABS: 75.0000 mg | ORAL_TABLET | Freq: Every day | ORAL | 0 refills | Status: AC

## 2019-12-06 NOTE — Plan of Care (Signed)
  Problem: Education: Goal: Knowledge of secondary prevention will improve Outcome: Progressing Goal: Knowledge of patient specific risk factors addressed and post discharge goals established will improve Outcome: Progressing   Problem: Coping: Goal: Will verbalize positive feelings about self Outcome: Progressing Goal: Will identify appropriate support needs Outcome: Progressing   Problem: Health Behavior/Discharge Planning: Goal: Ability to manage health-related needs will improve Outcome: Progressing   Problem: Self-Care: Goal: Ability to participate in self-care as condition permits will improve Outcome: Progressing Goal: Verbalization of feelings and concerns over difficulty with self-care will improve Outcome: Progressing Goal: Ability to communicate needs accurately will improve Outcome: Progressing   Problem: Nutrition: Goal: Risk of aspiration will decrease Outcome: Progressing   Problem: Ischemic Stroke/TIA Tissue Perfusion: Goal: Complications of ischemic stroke/TIA will be minimized Outcome: Progressing   Problem: Health Behavior/Discharge Planning: Goal: Ability to manage health-related needs will improve Outcome: Progressing   Problem: Clinical Measurements: Goal: Ability to maintain clinical measurements within normal limits will improve Outcome: Progressing Goal: Will remain free from infection Outcome: Progressing Goal: Diagnostic test results will improve Outcome: Progressing Goal: Respiratory complications will improve Outcome: Progressing   Problem: Activity: Goal: Risk for activity intolerance will decrease Outcome: Progressing   Problem: Nutrition: Goal: Adequate nutrition will be maintained Outcome: Progressing   Problem: Coping: Goal: Level of anxiety will decrease Outcome: Progressing   Problem: Elimination: Goal: Will not experience complications related to bowel motility Outcome: Progressing Goal: Will not experience  complications related to urinary retention Outcome: Progressing   Problem: Pain Managment: Goal: General experience of comfort will improve Outcome: Progressing   Problem: Safety: Goal: Ability to remain free from injury will improve Outcome: Progressing   Problem: Skin Integrity: Goal: Risk for impaired skin integrity will decrease Outcome: Progressing   Problem: Education: Goal: Knowledge of General Education information will improve Description: Including pain rating scale, medication(s)/side effects and non-pharmacologic comfort measures Outcome: Progressing

## 2019-12-06 NOTE — Progress Notes (Signed)
Pt is being discharged home.  Discharge papers given and explained to pt's daughter, Gerald Leitz.  Verbalized understanding.  Meds and f/u reviewed. Rx sent electronically to the pharmacy.  Pt's daughter made aware.

## 2019-12-06 NOTE — TOC Transition Note (Signed)
Transition of Care [facility]) - CM/SW Discharge Note   Patient Details  Name: Toni White MRN: [id number] Date of Birth: October 03, 1935  Transition of Care [facility]) CM/SW Contact:  Boris Sharper, LCSW Phone Number: 12/06/2019, 10:52 AM   Clinical Narrative:    Pt medically stable for discharge per MD. Jackquline Denmark to follow for [facility] PT. Pt has necessary DME.       Barriers to Discharge: Barriers Resolved   Patient Goals and CMS Choice Patient states their goals for this hospitalization and ongoing recovery are:: I hope to go home today CMS Medicare.gov Compare Post Acute Care list provided to:: Patient Choice offered to / list presented to : Patient  Discharge Placement                       Discharge Plan and Services In-house Referral: Clinical Social Work   Post Acute Care Choice: Home Health                    HH Arranged: PT [facility] Agency: [facility] Date [facility] Agency Contacted: 12/02/19 Time [place]: [id number] Representative spoke with at [place]: brittany  Social Determinants of Health ([place]) Interventions     Readmission Risk Interventions No flowsheet data found.

## 2019-12-06 NOTE — Discharge Summary (Signed)
Toni White [id number] DOB: May 04, 1936 DOA: 12/01/2019  PCP: Birdie Sons, MD  Admit date: 12/01/2019 Discharge date: 12/06/2019  Admitted From: Home Disposition: Home  Recommendations for Outpatient Follow-up:  1. Follow up with PCP in 1 week 2. Please obtain BMP/CBC in one week   Home Health:yes   Discharge Condition:Stable CODE STATUS:DNR  Diet recommendation: Heart Healthy  Brief/Interim Summary: Toni White  is a 84 y.o. pleasant Caucasian female with a known history of COPD, hypertension and dyslipidemia, who presented to the emergency room with an acute onset of slurred speech that started a couple of days ago without paresthesias or focal muscle weakness. CT of the head was no acute intracranial abnormality and revealed a small chronic bilateral basal ganglia lacunar infarcts. 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 She underwent CTA goals below revealing some carotid artery stenosis. Neurology was consulted.  1. Slurred speech and visual hallucinations concerning for TIA/left carotid stenosis80%--noted on CT angiogram of head and neck -continue her aspirin and add Plavix for dual antiplatelet therapy--per Neurology recommendaiton -continue statin therapy  -MRI brain --no acute CVA,but does show extensive small vessel ischemic changes and evidence of chronic microhemorrhages. These changes have been progressive over time when compared to old imaging. --seen byDr. Doy Mince , neurology --by speech therapist patient on regular consistency diet. Her speech has improved -physical therapy recommends home health PT. --Vascular consultation with Dr. Edwin Dada artery stenosis, s/p cerebral Lt carotid angiograms, thoracic aortogram, catheter placement into LCA from right femoral approach. No need for stent placement.  --cardiology consultation with Dr. Fenton Malling cardiac clearance.   Continue dual antiplatelet therapy with aspirin and Plavix   2. Hypertensive urgency. Continue with amlodipine, lasix, and beta blk.  3. Hypothyroidism. -continue Synthroid and check TSH level.  4. COPD. Without exacerbation, On 2L 02 at home Continue outpt inhalers on discharge  5.GERD. -continue PPI therapy.  6. Depression/anxiety. -continue Celexa.  7. Dyslipidemia. -continue statin therapy.    9. Leukocytosis- possibly stress induced, 2/2 right groin hematoma post carotid intervention. Afebrile.  Improving this am.  10. Right groin Hematoma -from s/p vascular angiogram Stable from vascular stand point. [place] for discharge F/u as outpt  Discharge Diagnoses:  Active Problems:   Hypertension   CVA (cerebral vascular accident) [facility])   Aphasia   Dysarthria   Carotid stenosis, left   Chronic respiratory failure with hypoxia (HCC)   TIA (transient ischemic attack)    Discharge Instructions  Discharge Instructions    Call MD for:  temperature >100.4   Complete by: As directed    Diet - low sodium heart healthy   Complete by: As directed    Discharge instructions   Complete by: As directed    Do not lift heavy things. Ambulate as tolerated   Increase activity slowly   Complete by: As directed      Allergies as of 12/06/2019      Reactions   Flexeril  [cyclobenzaprine Hcl]    Confusion   Reglan [metoclopramide] Hives   Relafen [nabumetone] Hives   Risperdal  [risperidone]    Confusion Rash   Sulfa Antibiotics Hives   Tape Other (See Comments)   Per patient pulls her skin off.   Triamterene-hctz    Hypercalcemia   Hctz [hydrochlorothiazide] Palpitations      Medication List    STOP taking these medications   meloxicam 15 MG tablet Commonly known as: MOBIC   predniSONE 10  MG tablet Commonly known as: DELTASONE     TAKE these medications   alendronate 70 MG tablet Commonly known as: FOSAMAX Take 1 tablet (70 mg total) by  mouth once a week. Take with a full glass of water on an empty stomach. What changed: additional instructions   amLODipine 5 MG tablet Commonly known as: NORVASC Take 1 tablet (5 mg total) by mouth daily. What changed:   medication strength  how much to take   aspirin 81 MG tablet Take 81 mg by mouth daily.   CALCIUM 500 PO Take 1 tablet by mouth 2 (two) times daily.   citalopram 20 MG tablet Commonly known as: CELEXA TAKE 1 TABLET EVERY DAY   clopidogrel 75 MG tablet Commonly known as: PLAVIX Take 1 tablet (75 mg total) by mouth daily.   Fluticasone-Salmeterol 250-50 MCG/DOSE Aepb Commonly known as: ADVAIR Inhale 1 puff into the lungs 2 (two) times daily.   furosemide 20 MG tablet Commonly known as: LASIX TAKE 1 TABLET TWICE DAILY AS NEEDED  FOR  SWELLING What changed: See the new instructions.   gabapentin 100 MG capsule Commonly known as: NEURONTIN Take 1 capsule (100 mg total) by mouth 2 (two) times daily.   levothyroxine 25 MCG tablet Commonly known as: SYNTHROID TAKE 1 AND 1/2 TABLETS EVERY DAY BEFORE BREAKFAST   metoprolol succinate 50 MG 24 hr tablet Commonly known as: TOPROL-XL TAKE 1/2 TABLET EVERY DAY   OXYGEN Inhale into the lungs Nightly.   pantoprazole 40 MG tablet Commonly known as: PROTONIX TAKE 1 TABLET EVERY DAY   pravastatin 40 MG tablet Commonly known as: PRAVACHOL TAKE 1 TABLET (40 MG TOTAL) BY MOUTH AT BEDTIME.   PreserVision AREDS 2+Multi Vit Caps Take 1 capsule by mouth 2 (two) times daily.   roflumilast 500 MCG Tabs tablet Commonly known as: DALIRESP Take 500 mcg by mouth daily.   tiotropium 18 MCG inhalation capsule Commonly known as: SPIRIVA Place 18 mcg into inhaler and inhale daily.   traMADol 50 MG tablet Commonly known as: ULTRAM TAKE 1 TABLET BY MOUTH EVERY 8 HOURS What changed:   when to take this  reasons to take this   Ventolin HFA 108 (90 Base) MCG/ACT inhaler Generic drug: albuterol Inhale 2 puffs  into the lungs. Every 4-6 hours as needed      Follow-up Information    Fisher, Kirstie Peri, MD. Schedule an appointment as soon as possible for a visit on 12/26/2019.   Specialty: Family Medicine Why: TIA;  @ 3:00 pm Contact information: [address] [place] [address] [id number]        Schnier, Dolores Lory, MD. Schedule an appointment as soon as possible for a visit on 12/22/2019.   Specialties: Vascular Surgery, Cardiology, Radiology, Vascular Surgery Why: left carotid stenosis;  @ 8:30 am Contact information: [place] [address] [phone]        Vladimir Crofts, MD Follow up in 2 week(s).   Specialty: Neurology Contact information: [facility] [place] [address] [phone]          Allergies  Allergen Reactions  . Flexeril  [Cyclobenzaprine Hcl]     Confusion  . Reglan [Metoclopramide] Hives  . Relafen [Nabumetone] Hives  . Risperdal  [Risperidone]     Confusion Rash  . Sulfa Antibiotics Hives  . Tape Other (See Comments)    Per patient pulls her skin off.  . Triamterene-Hctz     Hypercalcemia  .  Hctz [Hydrochlorothiazide] Palpitations    Consultations:  Cardiology, vascular, neurology   Procedures/Studies: CT ANGIO HEAD W OR WO CONTRAST  Result Date: 12/02/2019 CLINICAL DATA:  Carotid artery stenosis. Recent slurred speech and mental status changes. Basilar tip aneurysm. Left carotid stenosis by ultrasound. EXAM: CT ANGIOGRAPHY HEAD AND NECK TECHNIQUE: Multidetector CT imaging of the head and neck was performed using the standard protocol during bolus administration of intravenous contrast. Multiplanar CT image reconstructions and MIPs were obtained to evaluate the vascular anatomy. Carotid stenosis measurements (when applicable) are obtained utilizing NASCET criteria, using the distal internal carotid diameter as the denominator. CONTRAST:  81mL OMNIPAQUE IOHEXOL 350 MG/ML  SOLN COMPARISON:  MRI same day. Ultrasound same day. MRI 08/26/2013. CT angiography 08/19/2013. FINDINGS: CT HEAD FINDINGS Brain: Brain atrophy with chronic small-vessel ischemic changes affecting the pons, thalami, basal ganglia and hemispheric white matter. No sign of acute infarction, mass lesion, hemorrhage, hydrocephalus or extra-axial collection. Vascular: There is atherosclerotic calcification of the major vessels at the base of the brain. 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 See below. Skull: Negative Sinuses: Clear sinuses. Orbits: Lens calcification on the right. Review of the MIP images confirms the above findings CTA NECK FINDINGS Aortic arch: Aortic atherosclerotic calcification. No aneurysm or dissection. Branching pattern is normal without flow limiting stenosis. Right carotid system: Common carotid artery widely patent to the bifurcation and ICA bulb. Somewhat irregular soft and calcified plaque affecting the ICA bulb with minimal diameter of 3.4 mm. Compared to a more distal cervical ICA diameter of 5 mm, this indicates a 30% stenosis. Vessels are tortuous, swinging to the midline behind the oropharynx. Left carotid system: Common carotid artery is patent to the bifurcation. Severe irregular calcified plaque at the carotid bifurcation and ICA bulb. Luminal stenosis likely 1 mm. Compared to a more distal cervical ICA diameter of 5 mm, this indicates an 80% stenosis. Vessels are tortuous, swinging to the midline behind the oropharynx. Vertebral arteries: Calcified plaque at both vertebral artery origin regions but without stenosis greater than 30%. Scattered areas of calcified plaque through the cervical course of both vertebral arteries but no stenosis greater than 30%. Skeleton: Degenerative cervical spondylosis. Other neck: No mass or lymphadenopathy. Upper chest: Emphysema and pulmonary scarring at the apices. Review of the MIP images confirms the above findings CTA HEAD FINDINGS Anterior circulation:  Both internal carotid arteries are patent through the skull base and siphon regions. Ordinary siphon atherosclerotic calcification but without stenosis greater than 30%. Anterior and middle cerebral vessels are patent without large or medium vessel occlusion, aneurysm or vascular malformation. Posterior circulation: Both vertebral arteries are patent through the foramen magnum. There is atherosclerotic disease at both vertebral artery V4 segments with stenosis estimated at 50% on both sides. Both vessels supply the basilar. No basilar stenosis. 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 Venous sinuses: Patent and normal. Anatomic variants: None significant. Review of the MIP images confirms the above findings IMPRESSION: No acute large or medium vessel occlusion. Aortic Atherosclerosis (ICD10-I70.0) and Emphysema (ICD10-J43.9). Atherosclerotic disease at both carotid bifurcation and ICA bulb regions. 30% ICA stenosis on the right. 80% or greater irregular stenosis of the ICA bulb on the left. Vessels are tortuous, swinging to the midline behind the oropharynx. Atherosclerotic disease of both vertebral artery V4 segments with stenoses estimated at 50%. 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 Electronically  Signed   By: Nelson Chimes M.D.   On: 12/02/2019 15:56   CT HEAD WO CONTRAST  Result Date: 12/01/2019 CLINICAL DATA:  Slurred speech x2 days. EXAM: CT HEAD WITHOUT CONTRAST TECHNIQUE: Contiguous axial images were obtained from the base of the skull through the vertex without intravenous contrast. COMPARISON:  Dec 19, 2017 FINDINGS: Brain: There is mild cerebral atrophy with widening of the extra-axial spaces and ventricular dilatation. There are areas of decreased attenuation within the white matter tracts of the supratentorial brain, consistent with microvascular disease changes. Small chronic  bilateral basal ganglia lacunar infarcts are seen. 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 Skull: Normal. Negative for fracture or focal lesion. Sinuses/Orbits: No acute finding. Other: None. IMPRESSION: 1. Generalized cerebral atrophy. 2. No acute intracranial abnormality. 3. 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 4. Small chronic bilateral basal ganglia lacunar infarcts. Electronically Signed   By: Virgina Norfolk M.D.   On: 12/01/2019 20:16   CT ANGIO NECK W OR WO CONTRAST  Result Date: 12/02/2019 CLINICAL DATA:  Carotid artery stenosis. Recent slurred speech and mental status changes. Basilar tip aneurysm. Left carotid stenosis by ultrasound. EXAM: CT ANGIOGRAPHY HEAD AND NECK TECHNIQUE: Multidetector CT imaging of the head and neck was performed using the standard protocol during bolus administration of intravenous contrast. Multiplanar CT image reconstructions and MIPs were obtained to evaluate the vascular anatomy. Carotid stenosis measurements (when applicable) are obtained utilizing NASCET criteria, using the distal internal carotid diameter as the denominator. CONTRAST:  83mL OMNIPAQUE IOHEXOL 350 MG/ML SOLN COMPARISON:  MRI same day. Ultrasound same day. MRI 08/26/2013. CT angiography 08/19/2013. FINDINGS: CT HEAD FINDINGS Brain: Brain atrophy with chronic small-vessel ischemic changes affecting the pons, thalami, basal ganglia and hemispheric white matter. No sign of acute infarction, mass lesion, hemorrhage, hydrocephalus or extra-axial collection. Vascular: There is atherosclerotic calcification of the major vessels at the base of the brain. 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 See below. Skull: Negative Sinuses: Clear sinuses. Orbits: Lens calcification on the right. Review of the MIP images confirms the above findings CTA NECK  FINDINGS Aortic arch: Aortic atherosclerotic calcification. No aneurysm or dissection. Branching pattern is normal without flow limiting stenosis. Right carotid system: Common carotid artery widely patent to the bifurcation and ICA bulb. Somewhat irregular soft and calcified plaque affecting the ICA bulb with minimal diameter of 3.4 mm. Compared to a more distal cervical ICA diameter of 5 mm, this indicates a 30% stenosis. Vessels are tortuous, swinging to the midline behind the oropharynx. Left carotid system: Common carotid artery is patent to the bifurcation. Severe irregular calcified plaque at the carotid bifurcation and ICA bulb. Luminal stenosis likely 1 mm. Compared to a more distal cervical ICA diameter of 5 mm, this indicates an 80% stenosis. Vessels are tortuous, swinging to the midline behind the oropharynx. Vertebral arteries: Calcified plaque at both vertebral artery origin regions but without stenosis greater than 30%. Scattered areas of calcified plaque through the cervical course of both vertebral arteries but no stenosis greater than 30%. Skeleton: Degenerative cervical spondylosis. Other neck: No mass or lymphadenopathy. Upper chest: Emphysema and pulmonary scarring at the apices. Review of the MIP images confirms the above findings CTA HEAD FINDINGS Anterior circulation: Both internal carotid arteries are patent through the skull base and siphon regions. Ordinary siphon atherosclerotic calcification but without stenosis greater than 30%. Anterior and middle  cerebral vessels are patent without large or medium vessel occlusion, aneurysm or vascular malformation. Posterior circulation: Both vertebral arteries are patent through the foramen magnum. There is atherosclerotic disease at both vertebral artery V4 segments with stenosis estimated at 50% on both sides. Both vessels supply the basilar. No basilar stenosis. 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 Venous sinuses: Patent and normal. Anatomic variants: None significant. Review of the MIP images confirms the above findings IMPRESSION: No acute large or medium vessel occlusion. Aortic Atherosclerosis (ICD10-I70.0) and Emphysema (ICD10-J43.9). Atherosclerotic disease at both carotid bifurcation and ICA bulb regions. 30% ICA stenosis on the right. 80% or greater irregular stenosis of the ICA bulb on the left. Vessels are tortuous, swinging to the midline behind the oropharynx. Atherosclerotic disease of both vertebral artery V4 segments with stenoses estimated at 50%. 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 Electronically Signed   By: Nelson Chimes M.D.   On: 12/02/2019 15:56   MR BRAIN WO CONTRAST  Result Date: 12/02/2019 CLINICAL DATA:  Neuro deficit, acute, stroke suspected. Additional history provided: Acute onset slurred speech, headache, mild nausea without vomiting EXAM: MRI HEAD WITHOUT CONTRAST TECHNIQUE: Multiplanar, multiecho pulse sequences of the brain and surrounding structures were obtained without intravenous contrast. COMPARISON:  Noncontrast head CT 12/01/2019, noncontrast head CT 12/19/2017, brain MRI 08/26/2013, CT angiogram head 08/19/2013 FINDINGS: Brain: Multiple sequences are significantly motion degraded. Most notably there is moderate motion degradation of the axial and coronal diffusion-weighted sequence, moderate motion degradation of the axial SWI sequence, moderate motion degradation of the axial T1 weighted sequence, moderate/severe motion degradation of the axial T2 FLAIR sequence and moderate/severe motion degradation of the coronal T2 weighted sequence. Moderate to advanced patchy and confluent T2/FLAIR hyperintensity within the cerebral white matter is nonspecific, but consistent with chronic small vessel ischemic disease. Findings have progressed as compared to prior MRI 08/26/2013. Redemonstrated  chronic lacunar infarcts within the bilateral basal ganglia. Also again seen, there are chronic small-vessel ischemic changes within the thalami and pons. Chronic microhemorrhages within the right frontal lobe, left periatrial region and left cerebellum not definitively present on a prior MRI. Stable, mild generalized parenchymal atrophy. There is no acute infarct. No evidence of intracranial mass. No extra-axial fluid collection. No midline shift. Vascular: Expected proximal arterial flow voids. 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 Skull and upper cervical spine: No focal marrow lesion Sinuses/Orbits: Visualized orbits show no acute finding. Mild ethmoid and right maxillary sinus mucosal thickening. No significant mastoid effusion. IMPRESSION: 1. Motion degraded examination as described. 2. No evidence of acute intracranial abnormality, including acute infarction. 3. Moderate to advanced chronic small vessel ischemic disease has progressed as compared to prior MRI 08/26/2013. Redemonstrated chronic lacunar infarcts within the bilateral basal ganglia. 4. Nonspecific chronic microhemorrhages within the right frontal lobe, left periatrial region and left cerebellum which were not definitively present on the prior MRI. 5. Stable, mild generalized parenchymal atrophy. 6. 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 Electronically Signed   By: Kellie Simmering DO   On: 12/02/2019 08:26   US Carotid Bilateral (at [facility] and [facility] only)  Result Date: 12/02/2019 CLINICAL DATA:  84 year old female with TIA EXAM: BILATERAL CAROTID DUPLEX ULTRASOUND TECHNIQUE:  Gray scale imaging, color Doppler and duplex ultrasound were performed of bilateral carotid and vertebral arteries in the neck. COMPARISON:  None. FINDINGS: Criteria: Quantification of carotid stenosis is based on velocity  parameters that correlate the residual internal carotid diameter with NASCET-based stenosis levels, using the diameter of the distal internal carotid lumen as the denominator for stenosis measurement. The following velocity measurements were obtained: RIGHT ICA:  Systolic 99991111 cm/sec, Diastolic 21 cm/sec CCA:  69 cm/sec SYSTOLIC ICA/CCA RATIO:  2.0 ECA:  120 cm/sec LEFT ICA:  Systolic 0000000 cm/sec, Diastolic 21 cm/sec CCA:  75 cm/sec SYSTOLIC ICA/CCA RATIO:  3.8 ECA:  1 4 cm/sec Right Brachial SBP: Not acquired Left Brachial SBP: Not acquired RIGHT CAROTID ARTERY: No significant calcifications of the right common carotid artery. Intermediate waveform maintained. Moderate heterogeneous and partially calcified plaque at the right carotid bifurcation. No significant lumen shadowing. Low resistance waveform of the right ICA. Tortuosity RIGHT VERTEBRAL ARTERY: Antegrade flow with low resistance waveform. LEFT CAROTID ARTERY: No significant calcifications of the left common carotid artery. Intermediate waveform maintained. Moderate heterogeneous and partially calcified plaque at the left carotid bifurcation. No significant lumen shadowing. Low resistance waveform of the left ICA. Tortuosity LEFT VERTEBRAL ARTERY:  Antegrade flow with low resistance waveform. IMPRESSION: Right: Color duplex indicates moderate heterogeneous and calcified plaque, with no hemodynamically significant stenosis by duplex criteria in the extracranial cerebrovascular circulation. Left: Heterogeneous and partially calcified plaque at the left carotid bifurcation, with discordant results regarding degree of stenosis by established duplex criteria. Peak velocity suggests 70% - 99% stenosis, with the ICA/ CCA ratio suggesting a lesser degree of stenosis. If establishing a more accurate degree of stenosis is required, cerebral angiogram should be considered, or as a second best test, CTA. Signed, Dulcy Fanny. Dellia Nims, RPVI [facility] Radiology Electronically Signed   By: Corrie Mckusick D.O.   On: 12/02/2019 11:33   PERIPHERAL VASCULAR CATHETERIZATION  Result Date: 12/04/2019 See op note  DG Chest Port 1 View  Result Date: 12/05/2019 CLINICAL DATA:  Shortness of breath. EXAM: PORTABLE CHEST 1 VIEW COMPARISON:  12/02/2019 FINDINGS: Heart size remains enlarged. Calcification of the thoracic aorta is similar to the prior study. No consolidation or sign of pleural effusion. Slight increase in density along the paraspinal region likely reflects hiatal hernia. Spinal degenerative changes. No acute or destructive bone process. IMPRESSION: 1. No active cardiopulmonary disease. 2. Stable cardiomegaly and aortic atherosclerosis. Electronically Signed   By: Zetta Bills M.D.   On: 12/05/2019 10:42   DG Chest Portable 1 View  Result Date: 12/02/2019 CLINICAL DATA:  Slurred speech x2 days. EXAM: PORTABLE CHEST 1 VIEW COMPARISON:  January 26, 2014 FINDINGS: Mild diffuse chronic appearing increased lung markings are seen. There is no evidence of acute infiltrate, pleural effusion or pneumothorax. The cardiac silhouette is moderately enlarged. There is marked severity calcification of the aortic arch. Multilevel degenerative changes seen throughout the thoracic spine. IMPRESSION: Chronic-appearing increased lung markings without evidence of acute or active cardiopulmonary disease. Electronically Signed   By: Virgina Norfolk M.D.   On: 12/02/2019 00:33   ECHOCARDIOGRAM COMPLETE  Result Date: 12/03/2019    ECHOCARDIOGRAM REPORT   Patient Name:   Toni White Date of Exam: 12/02/2019 Medical Rec #:  [id number]     Height:       61.0 in Accession #:    [id number]    Weight:       169.0 lb Date of Birth:  1936/01/27      BSA:          1.758 m Patient Age:    89 years      BP:           156/69 mmHg Patient Gender: F             HR:           70 bpm. Exam Location:  [facility] Procedure: 2D Echo, Cardiac Doppler and Color  Doppler Indications:     TIA 435.9  History:         Patient has no prior history of Echocardiogram examinations.                  COPD; Risk Factors:Hypertension.  Sonographer:     Sherrie Sport RDCS (AE) Referring Phys:  [id number] Arvella Merles MANSY Diagnosing Phys: Yolonda Kida MD IMPRESSIONS  1. Left ventricular ejection fraction, by estimation, is 65 to 70%. The left ventricle has normal function. The left ventricle has no regional wall motion abnormalities. There is mild concentric left ventricular hypertrophy. Left ventricular diastolic parameters are consistent with Grade I diastolic dysfunction (impaired relaxation).  2. Right ventricular systolic function is moderately reduced. The right ventricular size is moderately enlarged. Mildly increased right ventricular wall thickness. There is normal pulmonary artery systolic pressure.  3. The mitral valve is normal in structure. No evidence of mitral valve regurgitation.  4. The aortic valve is grossly normal. Aortic valve regurgitation is not visualized. FINDINGS  Left Ventricle: Left ventricular ejection fraction, by estimation, is 65 to 70%. The left ventricle has normal function. The left ventricle has no regional wall motion abnormalities. The left ventricular internal cavity size was normal in size. There is  mild concentric left ventricular hypertrophy. Left ventricular diastolic parameters are consistent with Grade I diastolic dysfunction (impaired relaxation). Right Ventricle: The right ventricular size is moderately enlarged. Mildly increased right ventricular wall thickness. Right ventricular systolic function is moderately reduced. There is normal pulmonary artery systolic pressure. The tricuspid regurgitant velocity is 1.81 m/s, and with an assumed right atrial pressure of 10 mmHg, the estimated right ventricular systolic pressure is [id number] mmHg. Left Atrium: Left atrial size was normal in size. Right Atrium: Right atrial size was normal in size.  Pericardium: Trivial pericardial effusion is present. Mitral Valve: The mitral valve is normal in structure. No evidence of mitral valve regurgitation. Tricuspid Valve: The tricuspid valve is normal in structure. Tricuspid valve regurgitation is trivial. Aortic Valve: The aortic valve is grossly normal. Aortic valve regurgitation is not visualized. Aortic valve mean gradient measures 3.0 mmHg. Aortic valve peak gradient measures 5.5 mmHg. Aortic valve area, by VTI measures 3.64 cm. Pulmonic Valve: The pulmonic valve was grossly normal. Pulmonic valve regurgitation is not visualized. Aorta: The aortic root is normal in size and structure. IAS/Shunts: No atrial level shunt detected by color flow Doppler.  LEFT VENTRICLE PLAX 2D LVIDd:         3.50 cm  Diastology LVIDs:         2.14 cm  LV e' lateral:   6.42 cm/s LV PW:         0.91 cm  LV E/e' lateral: 16.2 LV IVS:        1.21 cm  LV e' medial:    6.31 cm/s LVOT diam:     2.00 cm  LV E/e' medial:  16.5 LV SV:         82 LV SV Index:  47 LVOT Area:     3.14 cm  RIGHT VENTRICLE RV Basal diam:  2.26 cm LEFT ATRIUM             Index       RIGHT ATRIUM           Index LA diam:        3.40 cm 1.93 cm/m  RA Area:     11.70 cm LA Vol (A2C):   33.6 ml 19.11 ml/m RA Volume:   23.20 ml  13.19 ml/m LA Vol (A4C):   33.9 ml 19.28 ml/m LA Biplane Vol: 33.6 ml 19.11 ml/m  AORTIC VALVE                   PULMONIC VALVE AV Area (Vmax):    3.11 cm    PV Vmax:       0.70 m/s AV Area (Vmean):   3.72 cm    PV Peak grad:  2.0 mmHg AV Area (VTI):     3.64 cm AV Vmax:           117.00 cm/s AV Vmean:          79.400 cm/s AV VTI:            0.225 m AV Peak Grad:      5.5 mmHg AV Mean Grad:      3.0 mmHg LVOT Vmax:         116.00 cm/s LVOT Vmean:        93.900 cm/s LVOT VTI:          0.261 m LVOT/AV VTI ratio: 1.16  AORTA Ao Root diam: 3.10 cm MITRAL VALVE                TRICUSPID VALVE MV Area (PHT): 2.18 cm     TR Peak grad:   13.1 mmHg MV Decel Time: 348 msec     TR Vmax:         181.00 cm/s MV E velocity: 104.00 cm/s MV A velocity: 135.00 cm/s  SHUNTS MV E/A ratio:  0.77         Systemic VTI:  0.26 m                             Systemic Diam: 2.00 cm Dwayne Prince Rome MD Electronically signed by Yolonda Kida MD Signature Date/Time: 12/03/2019/6:39:32 AM    Final        Subjective: Has no new complaints. Breathing at baseline. Less right groin pain.  Discharge Exam: Vitals:   12/06/19 0425 12/06/19 0827  BP: (!) 136/55 (!) 146/58  Pulse: 82 (!) 102  Resp: 16 20  Temp: 98.2 F (36.8 C) 98.1 F (36.7 C)  SpO2: 100% 100%   Vitals:   12/05/19 1953 12/05/19 2207 12/06/19 0425 12/06/19 0827  BP: 134/61 (!) 145/60 (!) 136/55 (!) 146/58  Pulse: 92 93 82 (!) 102  Resp: 16 16 16 20   Temp: 98.4 F (36.9 C) 99 F (37.2 C) 98.2 F (36.8 C) 98.1 F (36.7 C)  TempSrc: Oral Oral Oral Oral  SpO2: 100% 97% 100% 100%  Weight:      Height:        General: Pt is alert, awake, not in acute distress Cardiovascular: RRR, S1/S2 +, no rubs, no gallops Respiratory: CTA bilaterally, no wheezing, no rhonchi Abdominal: Soft, NT, ND, bowel sounds + Extremities: no edema, no cyanosis, right groin ecchymosis  with sourrounding ecchymosis. +distal pulses    The results of significant diagnostics from this hospitalization (including imaging, microbiology, ancillary and laboratory) are listed below for reference.     Microbiology: Recent Results (from the past 240 hour(s))  Respiratory Panel by RT PCR (Flu A&B, Covid) - Nasopharyngeal Swab     Status: None   Collection Time: 12/02/19 12:19 AM   Specimen: Nasopharyngeal Swab  Result Value Ref Range Status   SARS Coronavirus 2 by RT PCR NEGATIVE NEGATIVE Final    Comment: (NOTE) SARS-CoV-2 target nucleic acids are NOT DETECTED. The SARS-CoV-2 RNA is generally detectable in upper respiratoy specimens during the acute phase of infection. The lowest concentration of SARS-CoV-2 viral copies this assay can detect is 131  copies/mL. A negative result does not preclude SARS-Cov-2 infection and should not be used as the sole basis for treatment or other patient management decisions. A negative result may occur with  improper specimen collection/handling, submission of specimen other than nasopharyngeal swab, presence of viral mutation(s) within the areas targeted by this assay, and inadequate number of viral copies (<131 copies/mL). A negative result must be combined with clinical observations, patient history, and epidemiological information. The expected result is Negative. Fact Sheet for Patients:  PinkCheek.be Fact Sheet for Healthcare Providers:  GravelBags.it This test is not yet ap proved or cleared by the [place] FDA and  has been authorized for detection and/or diagnosis of SARS-CoV-2 by FDA under an Emergency Use Authorization (EUA). This EUA will remain  in effect (meaning this test can be used) for the duration of the COVID-19 declaration under Section 564(b)(1) of the Act, 21 U.S.C. section 360bbb-3(b)(1), unless the authorization is terminated or revoked sooner.    Influenza A by PCR NEGATIVE NEGATIVE Final   Influenza B by PCR NEGATIVE NEGATIVE Final    Comment: (NOTE) The Xpert Xpress SARS-CoV-2/FLU/RSV assay is intended as an aid in  the diagnosis of influenza from Nasopharyngeal swab specimens and  should not be used as a sole basis for treatment. Nasal washings and  aspirates are unacceptable for Xpert Xpress SARS-CoV-2/FLU/RSV  testing. Fact Sheet for Patients: PinkCheek.be Fact Sheet for Healthcare Providers: GravelBags.it This test is not yet approved or cleared by the [place] FDA and  has been authorized for detection and/or diagnosis of SARS-CoV-2 by  FDA under an Emergency Use Authorization (EUA). This EUA will remain  in effect (meaning this test can  be used) for the duration of the  Covid-19 declaration under Section 564(b)(1) of the Act, 21  U.S.C. section 360bbb-3(b)(1), unless the authorization is  terminated or revoked. Performed at [facility], [address] [place], [place] [address]      Labs: BNP (last 3 results) Recent Labs    12/06/19 0427  BNP [id number]   Basic Metabolic Panel: Recent Labs  Lab 12/01/19 1950 12/04/19 0642 12/05/19 0902 12/06/19 0427  NA 143 146* 140 140  K 3.6 3.2* 3.3* 3.7  CL 96* 105 102 103  CO2 35* 31 29 29   GLUCOSE 100* 92 114* 118*  BUN 24* 18 22 19   CREATININE 0.91 0.68 0.78 0.68  CALCIUM 9.7 8.5* 8.4* 8.4*  MG  --  1.8  --   --    Liver Function Tests: Recent Labs  Lab 12/01/19 1950  AST 27  ALT 35  ALKPHOS 55  BILITOT 0.7  PROT 7.1  ALBUMIN 3.6   No results for input(s): LIPASE, AMYLASE in the last 168 hours. No results for  input(s): AMMONIA in the last 168 hours. CBC: Recent Labs  Lab 12/01/19 1950 12/04/19 0642 12/05/19 0902 12/06/19 0427  WBC 15.3* 11.5* 22.2* 17.5*  NEUTROABS 11.8*  --   --   --   HGB 14.9 13.2 11.4* 10.2*  HCT 45.2 41.5 34.7* 31.3*  MCV 89.2 90.8 90.1 91.3  PLT 309 231 257 225   Cardiac Enzymes: No results for input(s): CKTOTAL, CKMB, CKMBINDEX, TROPONINI in the last 168 hours. BNP: Invalid input(s): POCBNP CBG: Recent Labs  Lab 12/04/19 0717 12/04/19 1018  GLUCAP 83 105*   D-Dimer No results for input(s): DDIMER in the last 72 hours. Hgb A1c No results for input(s): HGBA1C in the last 72 hours. Lipid Profile No results for input(s): CHOL, HDL, LDLCALC, TRIG, CHOLHDL, LDLDIRECT in the last 72 hours. Thyroid function studies No results for input(s): TSH, T4TOTAL, T3FREE, THYROIDAB in the last 72 hours.  Invalid input(s): FREET3 Anemia work up No results for input(s): VITAMINB12, FOLATE, FERRITIN, TIBC, IRON, RETICCTPCT in the last 72 hours. Urinalysis    Component Value Date/Time   COLORURINE YELLOW (A)  12/01/2019 2027   APPEARANCEUR CLEAR (A) 12/01/2019 2027   APPEARANCEUR Hazy 01/26/2014 1040   LABSPEC 1.018 12/01/2019 2027   LABSPEC 1.017 01/26/2014 1040   PHURINE 5.0 12/01/2019 2027   GLUCOSEU NEGATIVE 12/01/2019 2027   GLUCOSEU Negative 01/26/2014 1040   HGBUR NEGATIVE 12/01/2019 2027   BILIRUBINUR NEGATIVE 12/01/2019 2027   BILIRUBINUR Negative 01/26/2014 1040   KETONESUR NEGATIVE 12/01/2019 2027   PROTEINUR NEGATIVE 12/01/2019 2027   NITRITE NEGATIVE 12/01/2019 2027   LEUKOCYTESUR TRACE (A) 12/01/2019 2027   LEUKOCYTESUR Negative 01/26/2014 1040   Sepsis Labs Invalid input(s): PROCALCITONIN,  WBC,  LACTICIDVEN Microbiology Recent Results (from the past 240 hour(s))  Respiratory Panel by RT PCR (Flu A&B, Covid) - Nasopharyngeal Swab     Status: None   Collection Time: 12/02/19 12:19 AM   Specimen: Nasopharyngeal Swab  Result Value Ref Range Status   SARS Coronavirus 2 by RT PCR NEGATIVE NEGATIVE Final    Comment: (NOTE) SARS-CoV-2 target nucleic acids are NOT DETECTED. The SARS-CoV-2 RNA is generally detectable in upper respiratoy specimens during the acute phase of infection. The lowest concentration of SARS-CoV-2 viral copies this assay can detect is 131 copies/mL. A negative result does not preclude SARS-Cov-2 infection and should not be used as the sole basis for treatment or other patient management decisions. A negative result may occur with  improper specimen collection/handling, submission of specimen other than nasopharyngeal swab, presence of viral mutation(s) within the areas targeted by this assay, and inadequate number of viral copies (<131 copies/mL). A negative result must be combined with clinical observations, patient history, and epidemiological information. The expected result is Negative. Fact Sheet for Patients:  PinkCheek.be Fact Sheet for Healthcare Providers:  GravelBags.it This test is  not yet ap proved or cleared by the [place] FDA and  has been authorized for detection and/or diagnosis of SARS-CoV-2 by FDA under an Emergency Use Authorization (EUA). This EUA will remain  in effect (meaning this test can be used) for the duration of the COVID-19 declaration under Section 564(b)(1) of the Act, 21 U.S.C. section 360bbb-3(b)(1), unless the authorization is terminated or revoked sooner.    Influenza A by PCR NEGATIVE NEGATIVE Final   Influenza B by PCR NEGATIVE NEGATIVE Final    Comment: (NOTE) The Xpert Xpress SARS-CoV-2/FLU/RSV assay is intended as an aid in  the diagnosis of influenza from Nasopharyngeal swab specimens and  should not be used as a sole basis for treatment. Nasal washings and  aspirates are unacceptable for Xpert Xpress SARS-CoV-2/FLU/RSV  testing. Fact Sheet for Patients: PinkCheek.be Fact Sheet for Healthcare Providers: GravelBags.it This test is not yet approved or cleared by the [place] FDA and  has been authorized for detection and/or diagnosis of SARS-CoV-2 by  FDA under an Emergency Use Authorization (EUA). This EUA will remain  in effect (meaning this test can be used) for the duration of the  Covid-19 declaration under Section 564(b)(1) of the Act, 21  U.S.C. section 360bbb-3(b)(1), unless the authorization is  terminated or revoked. Performed at [facility], [address] [place], [place] [address]      Time coordinating discharge: Over 30 minutes  SIGNED:   Nolberto Hanlon, MD  [facility] 12/06/2019, 10:47 AM Pager   If 7PM-7AM, please contact night-coverage [email or web address] Password [email or web address]

## 2019-12-06 NOTE — Progress Notes (Signed)
2 Days Post-Op   Subjective/Chief Complaint: Mild soreness at access site in RIGHT groin.   Objective: Vital signs in last 24 hours: Temp:  [98.1 F (36.7 C)-99 F (37.2 C)] 98.1 F (36.7 C) (05/01 0827) Pulse Rate:  [82-102] 102 (05/01 0827) Resp:  [16-20] 20 (05/01 0827) BP: (134-146)/(55-61) 146/58 (05/01 0827) SpO2:  [97 %-100 %] 100 % (05/01 0827) Last BM Date: 12/05/19  Intake/Output from previous day: 04/30 0701 - 05/01 0700 In: 477 [P.O.:477] Out: 550 [Urine:550] Intake/Output this shift: No intake/output data recorded.  General appearance: alert and no distress Cardio: regular rate and rhythm Extremities: extremities normal, atraumatic, no cyanosis or edema, warm, RIGHT GROIN- mild tenderness, ecchymosis, soft, no hematoma, no pulsatile mass, leg warm, well perfused to foot.  Lab Results:  Recent Labs    12/05/19 0902 12/06/19 0427  WBC 22.2* 17.5*  HGB 11.4* 10.2*  HCT 34.7* 31.3*  PLT 257 225   BMET Recent Labs    12/05/19 0902 12/06/19 0427  NA 140 140  K 3.3* 3.7  CL 102 103  CO2 29 29  GLUCOSE 114* 118*  BUN 22 19  CREATININE 0.78 0.68  CALCIUM 8.4* 8.4*   PT/INR Recent Labs    12/04/19 0642  LABPROT 13.8  INR 1.1   ABG No results for input(s): PHART, HCO3 in the last 72 hours.  Invalid input(s): PCO2, PO2  Studies/Results: DG Chest Port 1 View  Result Date: 12/05/2019 CLINICAL DATA:  Shortness of breath. EXAM: PORTABLE CHEST 1 VIEW COMPARISON:  12/02/2019 FINDINGS: Heart size remains enlarged. Calcification of the thoracic aorta is similar to the prior study. No consolidation or sign of pleural effusion. Slight increase in density along the paraspinal region likely reflects hiatal hernia. Spinal degenerative changes. No acute or destructive bone process. IMPRESSION: 1. No active cardiopulmonary disease. 2. Stable cardiomegaly and aortic atherosclerosis. Electronically Signed   By: Zetta Bills M.D.   On: 12/05/2019 10:42     Anti-infectives: Anti-infectives (From admission, onward)   Start     Dose/Rate Route Frequency Ordered Stop   12/04/19 0928  ceFAZolin (ANCEF) 2-4 GM/100ML-% IVPB    Note to Pharmacy: Corlis Hove   : cabinet override      12/04/19 0928 12/04/19 2144   12/04/19 0000  ceFAZolin (ANCEF) IVPB 2g/100 mL premix    Note to Pharmacy: To be given in specials   2 g 200 mL/hr over 30 Minutes Intravenous  Once 12/03/19 1754 12/04/19 0834      Assessment/Plan: s/p Procedure(s): CAROTID PTA/STENT INTERVENTION (Left) OK from Vascular standpoint to D/C home.   No change from Prior note, PA Stegmayer: 1) Carotid Stenosis: - CT angio suggested greater than 80% stenosis of the left internal carotid artery. Carotid Angiogram Findings: - There is a 50% stenosis at the origin of the left internal carotid artery with a densely calcified lesion. No need for stent placement.  -Agree with the continuation of aspirin and Plavix for dual antiplatelet therapy.  -Continue statin for medical management.  -Wll continue to follow in the outpatient setting.  2) Right Groin Access Site: - No active bleeding noted on exam. Dressing changed.  LOS: 4 days    Toni White 12/06/2019

## 2019-12-08 ENCOUNTER — Telehealth: Payer: Self-pay

## 2019-12-08 MED ORDER — COLCHICINE 0.6 MG PO TABS: ORAL_TABLET | ORAL | 1 refills | Status: DC

## 2019-12-08 NOTE — Telephone Encounter (Signed)
Patient's' daughter [name] advised. Patient needs a RX for Colchicine sent to [facility] pharmacy.

## 2019-12-08 NOTE — Telephone Encounter (Signed)
Returning a call regarding patient.  Patient is now staying with Ms. Anderson at [phone].  Please call in the future at this number.

## 2019-12-08 NOTE — Telephone Encounter (Signed)
Transition Care Management Follow-up Telephone Call  Date of discharge and from where: [facility] on 12/06/19.  How have you been since you were released from the hospital? Per daughter- pt is doing ok but is still weak and confused. Pt is still seeing numbers on the wall and her vision is has got worse. Pt is still slurring her words at times. Pt's heart rate increases with movement (around 102-111) and her oxygen has been running good (last checked was 98).  Stent site is healing well. Pt is using a spirometer and is getting good readings but she does have SOB at times. Declines fever, headache, pain or n/v/d.  Any questions or concerns? Yes, pt was removed from Meloxicam while at the hospital and was told to f/u with PCP about continuing this. Daughter would like to know if she should restart this or not? Pt's grandson will be coming by the office this week to have FMLA papers completed to help take care of pt. Pt was taken off Prednisone that was prescribed for gout prior to hospital stay. Pt's foot does look better but is not completely healed. Foot is still slightly swollen and is still red but not as bad. Daughter would like to know if that is ok or does she need to go back on the Prednisone? Lastly, Amlodipine was increased to 5 mg from 2.5 mg. Is that ok?  Items Reviewed:  Did the pt receive and understand the discharge instructions provided? Yes   Medications obtained and verified? Yes   Any new allergies since your discharge? No   Dietary orders reviewed? Yes  Do you have support at home? Yes , pt is currently residing with daughter.  Other (ie: DME, Home Health, etc) PT was ordered and will be coming to the home 12/11/19 to assist with instability.  Functional Questionnaire: (I = Independent and D = Dependent)  Bathing/Dressing- D, has assistance from daughter.     Meal Prep- D, daughter does the cooking.  Eating- I  Maintaining continence- D, wears depends at all  times.  Transferring/Ambulation- D, uses a walker at all times.  Managing Meds- D, daughter manages.   Follow up appointments reviewed:    PCP Hospital f/u appt confirmed? Yes , scheduled to see Dr Caryn Section on 12/26/19 @ 3:00 PM.  [facility] f/u appt confirmed? Yes    Are transportation arrangements needed? No   If their condition worsens, is the pt aware to call  their PCP or go to the ED? Yes  Was the patient provided with contact information for the PCP's office or ED? Yes  Was the pt encouraged to call back with questions or concerns? Yes

## 2019-12-08 NOTE — Telephone Encounter (Signed)
I have made the 1st attempt to contact the patient or family member in charge, in order to follow up from recently being discharged from the hospital. I left a message on voicemail requesting a CB. -MM 

## 2019-12-08 NOTE — Telephone Encounter (Signed)
Yes, she needs to stay off of meloxicam for the time being because it interacts with Plavix.  Yes, she needs to stay on amlodipine 5mg . It's very important to keep BP under good control after a stroke.  If the gout flares up again, I would prefer she take colchicine instead of prednisone, it has fewer side effects. Let me know if they need prescription for colchicine.

## 2019-12-08 NOTE — Telephone Encounter (Signed)
HFU scheduled 12/26/19 @ 3:00 PM with Dr Caryn Section.

## 2019-12-09 DIAGNOSIS — J449 Chronic obstructive pulmonary disease, unspecified: Secondary | ICD-10-CM | POA: Diagnosis not present

## 2019-12-09 LAB — POCT ACTIVATED CLOTTING TIME: Activated Clotting Time: 279 seconds

## 2019-12-11 DIAGNOSIS — F3341 Major depressive disorder, recurrent, in partial remission: Secondary | ICD-10-CM | POA: Diagnosis not present

## 2019-12-11 DIAGNOSIS — H547 Unspecified visual loss: Secondary | ICD-10-CM | POA: Diagnosis not present

## 2019-12-11 DIAGNOSIS — I69322 Dysarthria following cerebral infarction: Secondary | ICD-10-CM | POA: Diagnosis not present

## 2019-12-11 DIAGNOSIS — E785 Hyperlipidemia, unspecified: Secondary | ICD-10-CM | POA: Diagnosis not present

## 2019-12-11 DIAGNOSIS — J449 Chronic obstructive pulmonary disease, unspecified: Secondary | ICD-10-CM | POA: Diagnosis not present

## 2019-12-11 DIAGNOSIS — I69328 Other speech and language deficits following cerebral infarction: Secondary | ICD-10-CM | POA: Diagnosis not present

## 2019-12-11 DIAGNOSIS — I10 Essential (primary) hypertension: Secondary | ICD-10-CM | POA: Diagnosis not present

## 2019-12-11 DIAGNOSIS — J9611 Chronic respiratory failure with hypoxia: Secondary | ICD-10-CM | POA: Diagnosis not present

## 2019-12-11 DIAGNOSIS — I872 Venous insufficiency (chronic) (peripheral): Secondary | ICD-10-CM | POA: Diagnosis not present

## 2019-12-15 ENCOUNTER — Telehealth: Payer: Self-pay

## 2019-12-15 NOTE — Telephone Encounter (Signed)
Copied from [place] [phone]. Topic: Appointment Scheduling - Scheduling Inquiry for Clinic >> Dec 15, 2019  9:24 AM Scherrie Gerlach wrote: Reason for CRM: daughter calling to ask for a sooner appt than the 5/21 appt that she has now.  Daughter states pt is having to wear her O2 all the time, not just at night as advised at discharge, because her sats drop to around 82 if she does not have it on.  Right now pt is fine, but is extremely tired.  At times her breathing is not so good.  Daughter would like her evaluated by Dr Caryn Section asap.

## 2019-12-15 NOTE — Telephone Encounter (Signed)
My 3pm on 12/16/2019 just opened up

## 2019-12-15 NOTE — Telephone Encounter (Signed)
Spoke to Tensed and she wanted me to let Dr. Caryn Section know that her mother has an appointment for tomorrow at 2:30PM with Pulmonology.

## 2019-12-15 NOTE — Telephone Encounter (Signed)
Could work in Friday at 3:20. If she needs to be seen sooner can schedule her with a PA

## 2019-12-16 ENCOUNTER — Other Ambulatory Visit: Payer: Self-pay

## 2019-12-16 ENCOUNTER — Inpatient Hospital Stay
Admission: EM | Admit: 2019-12-16 | Discharge: 2019-12-24 | DRG: 190 | Disposition: A | Discharge status: Hospice | Payer: Medicare HMO | Attending: Internal Medicine | Admitting: Internal Medicine

## 2019-12-16 ENCOUNTER — Telehealth: Payer: Self-pay | Admitting: Family Medicine

## 2019-12-16 ENCOUNTER — Emergency Department: Payer: Medicare HMO

## 2019-12-16 DIAGNOSIS — E870 Hyperosmolality and hypernatremia: Secondary | ICD-10-CM | POA: Diagnosis not present

## 2019-12-16 DIAGNOSIS — I10 Essential (primary) hypertension: Secondary | ICD-10-CM | POA: Diagnosis not present

## 2019-12-16 DIAGNOSIS — E785 Hyperlipidemia, unspecified: Secondary | ICD-10-CM | POA: Diagnosis present

## 2019-12-16 DIAGNOSIS — E039 Hypothyroidism, unspecified: Secondary | ICD-10-CM | POA: Diagnosis present

## 2019-12-16 DIAGNOSIS — Z90710 Acquired absence of both cervix and uterus: Secondary | ICD-10-CM | POA: Diagnosis not present

## 2019-12-16 DIAGNOSIS — R627 Adult failure to thrive: Secondary | ICD-10-CM | POA: Diagnosis present

## 2019-12-16 DIAGNOSIS — R05 Cough: Secondary | ICD-10-CM | POA: Diagnosis not present

## 2019-12-16 DIAGNOSIS — R9389 Abnormal findings on diagnostic imaging of other specified body structures: Secondary | ICD-10-CM

## 2019-12-16 DIAGNOSIS — Z79890 Hormone replacement therapy (postmenopausal): Secondary | ICD-10-CM

## 2019-12-16 DIAGNOSIS — G629 Polyneuropathy, unspecified: Secondary | ICD-10-CM | POA: Diagnosis present

## 2019-12-16 DIAGNOSIS — J189 Pneumonia, unspecified organism: Secondary | ICD-10-CM | POA: Diagnosis not present

## 2019-12-16 DIAGNOSIS — Z7189 Other specified counseling: Secondary | ICD-10-CM

## 2019-12-16 DIAGNOSIS — G9341 Metabolic encephalopathy: Secondary | ICD-10-CM | POA: Diagnosis present

## 2019-12-16 DIAGNOSIS — K219 Gastro-esophageal reflux disease without esophagitis: Secondary | ICD-10-CM | POA: Diagnosis not present

## 2019-12-16 DIAGNOSIS — E860 Dehydration: Secondary | ICD-10-CM | POA: Diagnosis present

## 2019-12-16 DIAGNOSIS — Z515 Encounter for palliative care: Secondary | ICD-10-CM

## 2019-12-16 DIAGNOSIS — I959 Hypotension, unspecified: Secondary | ICD-10-CM | POA: Diagnosis not present

## 2019-12-16 DIAGNOSIS — I69328 Other speech and language deficits following cerebral infarction: Secondary | ICD-10-CM | POA: Diagnosis not present

## 2019-12-16 DIAGNOSIS — F419 Anxiety disorder, unspecified: Secondary | ICD-10-CM | POA: Diagnosis present

## 2019-12-16 DIAGNOSIS — R791 Abnormal coagulation profile: Secondary | ICD-10-CM | POA: Diagnosis present

## 2019-12-16 DIAGNOSIS — Z79899 Other long term (current) drug therapy: Secondary | ICD-10-CM

## 2019-12-16 DIAGNOSIS — R0602 Shortness of breath: Secondary | ICD-10-CM | POA: Diagnosis not present

## 2019-12-16 DIAGNOSIS — J810 Acute pulmonary edema: Secondary | ICD-10-CM | POA: Diagnosis present

## 2019-12-16 DIAGNOSIS — R531 Weakness: Secondary | ICD-10-CM | POA: Diagnosis not present

## 2019-12-16 DIAGNOSIS — J441 Chronic obstructive pulmonary disease with (acute) exacerbation: Secondary | ICD-10-CM | POA: Diagnosis present

## 2019-12-16 DIAGNOSIS — J9621 Acute and chronic respiratory failure with hypoxia: Secondary | ICD-10-CM | POA: Diagnosis present

## 2019-12-16 DIAGNOSIS — Z87891 Personal history of nicotine dependence: Secondary | ICD-10-CM

## 2019-12-16 DIAGNOSIS — Z66 Do not resuscitate: Secondary | ICD-10-CM | POA: Diagnosis not present

## 2019-12-16 DIAGNOSIS — R54 Age-related physical debility: Secondary | ICD-10-CM | POA: Diagnosis present

## 2019-12-16 DIAGNOSIS — R0600 Dyspnea, unspecified: Secondary | ICD-10-CM | POA: Diagnosis not present

## 2019-12-16 DIAGNOSIS — Z9981 Dependence on supplemental oxygen: Secondary | ICD-10-CM

## 2019-12-16 DIAGNOSIS — R55 Syncope and collapse: Secondary | ICD-10-CM

## 2019-12-16 DIAGNOSIS — R059 Cough, unspecified: Secondary | ICD-10-CM

## 2019-12-16 DIAGNOSIS — I872 Venous insufficiency (chronic) (peripheral): Secondary | ICD-10-CM | POA: Diagnosis not present

## 2019-12-16 DIAGNOSIS — H547 Unspecified visual loss: Secondary | ICD-10-CM | POA: Diagnosis not present

## 2019-12-16 DIAGNOSIS — J432 Centrilobular emphysema: Principal | ICD-10-CM | POA: Diagnosis present

## 2019-12-16 DIAGNOSIS — E876 Hypokalemia: Secondary | ICD-10-CM | POA: Diagnosis present

## 2019-12-16 DIAGNOSIS — Z20822 Contact with and (suspected) exposure to covid-19: Secondary | ICD-10-CM | POA: Diagnosis not present

## 2019-12-16 DIAGNOSIS — I69322 Dysarthria following cerebral infarction: Secondary | ICD-10-CM | POA: Diagnosis not present

## 2019-12-16 DIAGNOSIS — Z8673 Personal history of transient ischemic attack (TIA), and cerebral infarction without residual deficits: Secondary | ICD-10-CM

## 2019-12-16 DIAGNOSIS — Z8249 Family history of ischemic heart disease and other diseases of the circulatory system: Secondary | ICD-10-CM | POA: Diagnosis not present

## 2019-12-16 DIAGNOSIS — I700 Atherosclerosis of aorta: Secondary | ICD-10-CM | POA: Diagnosis not present

## 2019-12-16 DIAGNOSIS — J9610 Chronic respiratory failure, unspecified whether with hypoxia or hypercapnia: Secondary | ICD-10-CM

## 2019-12-16 DIAGNOSIS — J9811 Atelectasis: Secondary | ICD-10-CM | POA: Diagnosis present

## 2019-12-16 DIAGNOSIS — J9611 Chronic respiratory failure with hypoxia: Secondary | ICD-10-CM | POA: Diagnosis not present

## 2019-12-16 DIAGNOSIS — Z882 Allergy status to sulfonamides status: Secondary | ICD-10-CM

## 2019-12-16 DIAGNOSIS — Z9109 Other allergy status, other than to drugs and biological substances: Secondary | ICD-10-CM

## 2019-12-16 DIAGNOSIS — F0150 Vascular dementia without behavioral disturbance: Secondary | ICD-10-CM | POA: Diagnosis present

## 2019-12-16 DIAGNOSIS — Z7902 Long term (current) use of antithrombotics/antiplatelets: Secondary | ICD-10-CM

## 2019-12-16 DIAGNOSIS — F3341 Major depressive disorder, recurrent, in partial remission: Secondary | ICD-10-CM | POA: Diagnosis not present

## 2019-12-16 DIAGNOSIS — Z8619 Personal history of other infectious and parasitic diseases: Secondary | ICD-10-CM

## 2019-12-16 DIAGNOSIS — J449 Chronic obstructive pulmonary disease, unspecified: Secondary | ICD-10-CM | POA: Diagnosis not present

## 2019-12-16 DIAGNOSIS — Z7951 Long term (current) use of inhaled steroids: Secondary | ICD-10-CM

## 2019-12-16 DIAGNOSIS — Z888 Allergy status to other drugs, medicaments and biological substances status: Secondary | ICD-10-CM

## 2019-12-16 DIAGNOSIS — R000 Tachycardia, unspecified: Secondary | ICD-10-CM | POA: Diagnosis not present

## 2019-12-16 DIAGNOSIS — Z7982 Long term (current) use of aspirin: Secondary | ICD-10-CM

## 2019-12-16 DIAGNOSIS — F329 Major depressive disorder, single episode, unspecified: Secondary | ICD-10-CM | POA: Diagnosis present

## 2019-12-16 LAB — CBC
HCT: 34.5 % — ABNORMAL LOW (ref 36.0–46.0)
Hemoglobin: 11.5 g/dL — ABNORMAL LOW (ref 12.0–15.0)
MCH: 30.2 pg (ref 26.0–34.0)
MCHC: 33.3 g/dL (ref 30.0–36.0)
MCV: 90.6 fL (ref 80.0–100.0)
Platelets: 362 10*3/uL (ref 150–400)
RBC: 3.81 MIL/uL — ABNORMAL LOW (ref 3.87–5.11)
RDW: 13.8 % (ref 11.5–15.5)
WBC: 15.3 10*3/uL — ABNORMAL HIGH (ref 4.0–10.5)
nRBC: 0 % (ref 0.0–0.2)

## 2019-12-16 LAB — URINALYSIS, COMPLETE (UACMP) WITH MICROSCOPIC
Bacteria, UA: NONE SEEN
Bilirubin Urine: NEGATIVE
Glucose, UA: NEGATIVE mg/dL
Hgb urine dipstick: NEGATIVE
Ketones, ur: 20 mg/dL — AB
Nitrite: NEGATIVE
Protein, ur: NEGATIVE mg/dL
Specific Gravity, Urine: 1.03 (ref 1.005–1.030)
pH: 6 (ref 5.0–8.0)

## 2019-12-16 LAB — BASIC METABOLIC PANEL
Anion gap: 9 (ref 5–15)
BUN: 10 mg/dL (ref 8–23)
CO2: 30 mmol/L (ref 22–32)
Calcium: 9.7 mg/dL (ref 8.9–10.3)
Chloride: 99 mmol/L (ref 98–111)
Creatinine, Ser: 0.71 mg/dL (ref 0.44–1.00)
GFR calc Af Amer: >60 mL/min (ref >60)
GFR calc non Af Amer: >60 mL/min (ref >60)
Glucose, Bld: 118 mg/dL — ABNORMAL HIGH (ref 70–99)
Potassium: 3.9 mmol/L (ref 3.5–5.1)
Sodium: 138 mmol/L (ref 135–145)

## 2019-12-16 LAB — SARS CORONAVIRUS 2 BY RT PCR (HOSPITAL ORDER, PERFORMED IN ~~LOC~~ HOSPITAL LAB): SARS Coronavirus 2: NEGATIVE

## 2019-12-16 MED ORDER — HEPARIN (PORCINE) 25000 UT/250ML-% IV SOLN: 800.0000 [IU]/h | INTRAVENOUS | Status: DC

## 2019-12-16 MED ORDER — SODIUM CHLORIDE 0.9% FLUSH: 3.0000 mL | Freq: Once | INTRAVENOUS | Status: DC

## 2019-12-16 MED ORDER — ASPIRIN 81 MG PO CHEW: 324.0000 mg | CHEWABLE_TABLET | Freq: Once | ORAL | Status: DC

## 2019-12-16 MED ORDER — IOHEXOL 350 MG/ML SOLN
75.0000 mL | Freq: Once | INTRAVENOUS | Status: AC | PRN
  Administered 2019-12-16: 75 mL via INTRAVENOUS

## 2019-12-16 MED ORDER — HEPARIN BOLUS VIA INFUSION
4000.0000 [IU] | Freq: Once | INTRAVENOUS | Status: DC
  Filled 2019-12-16: qty 4000

## 2019-12-16 NOTE — Telephone Encounter (Signed)
Verbal orders given to [facility] with [facility]

## 2019-12-16 NOTE — ED Triage Notes (Signed)
Pt in via EMS from home with c/o near syncopal episode. Pt was sitting in car gettting ready to go to an appt and she felt like she was going to pass out. Pt usually on 2L but was bumped up to 4L due to sats in the upper 80's. Recent CVA and surgery.

## 2019-12-16 NOTE — Telephone Encounter (Signed)
Home Health Verbal Orders - Caller/AgencyEustaquio White, [facility] Callback Number: [phone] Requesting OT/PT/Skilled Nursing/Social Work/Speech Therapy: OT Frequency: 1x8

## 2019-12-16 NOTE — Telephone Encounter (Signed)
That's fine

## 2019-12-16 NOTE — ED Notes (Signed)
Pt reports felling weak today.  No  Chest pain or sob.  Pt was discharged from [facility] 1 week ago and daughter states she has not been doing well since the discharge .  Pt is on 4 liters oxygen  Pt alert  Family with pt

## 2019-12-16 NOTE — ED Notes (Signed)
Iv team in with pt now 

## 2019-12-16 NOTE — Telephone Encounter (Signed)
LMTCB regarding hospital follow up appointment.

## 2019-12-16 NOTE — ED Provider Notes (Signed)
[facility] Emergency Department Provider Note  ____________________________________________   First MD Initiated Contact with Patient 12/16/19 1954     (approximate)  I have reviewed the triage vital signs and the nursing notes.   HISTORY  Chief Complaint Near Syncope    HPI Toni White is a 84 y.o. female with COPD, hypertension, hyperlipidemia who comes in from home for concern for possible near syncope.  Patient was sitting in her car getting ready to go to an appointment and she felt like she was going to pass out.  Daughter is at bedside he states that she was going to go to a follow-up appointment today.  Patient requires assistance with walker because she is unsteady on her feet.  They were try to get her down the stairs when they noticed that her oxygen levels were lower in the 80s that she seem to be a more work of breathing.  This was constant, better at rest, nothing makes it worse.  She reports that she is on 2 L of oxygen during the day.  She has severe COPD that she follows pulmonary for.  She states that even when PT works with her sometimes the oxygen levels go into the 80s but their pulse ox.  No fevers.  Should not fall did not hit her head.  She looks like she is about to pass out but she did not lose consciousness.  Her near syncopal episodes occur mostly with exertion, intermittent, better with rest, worse with exertion  On review of records patient was recently admitted for concern for possible stroke and had carotid artery stenosis that was intervened upon by vascular surgery Dr. Delana Meyer but did not end up needing a stent due to only being about 50% when he did the angiography..  Patient is on aspirin and Plavix.          Past Medical History:  Diagnosis Date  . COPD (chronic obstructive pulmonary disease) ([place])   . DNR (do not resuscitate) 11/2019   DNR/DNI, confirmed in person by patient and her daughter/POA  . History of chicken  pox   . Hyperlipidemia   . Hypertension   . Ulcer     Patient Active Problem List   Diagnosis Date Noted  . CVA (cerebral vascular accident) ([place]) 12/02/2019  . Aphasia 12/02/2019  . Dysarthria   . Carotid stenosis, left   . Chronic respiratory failure with hypoxia ([place])   . TIA (transient ischemic attack)   . Varicose veins of both legs with edema 05/02/2017  . Recurrent major depressive disorder, in partial remission ([place]) 06/08/2015  . Hypersomnolence 06/08/2015  . Macular degeneration 06/08/2015  . Sciatica 06/08/2015  . LBP (low back pain) 06/08/2015  . OP (osteoporosis) 06/08/2015  . Bibasilar crackles 06/08/2015  . Edema extremities 06/08/2015  . Tremor 03/17/2015  . 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 Diffuse cystic mastopathy 06/26/2013  . Myalgia and myositis 07/06/2009  . Hypoxemia 07/06/2009  . Hyperlipidemia 05/19/2008  . Prediabetes 12/18/2007  . One eye: total vision impairment; other eye: normal vision 06/29/2007  . Hypertension 08/07/1998  . Chronic obstructive pulmonary disease ([place]) 08/07/1998    Past Surgical History:  Procedure Laterality Date  . ABDOMINAL HYSTERECTOMY  2003   with BSO due to bleeding  . APPENDECTOMY    . BREAST BIOPSY Left 2005   stereo breast biopsy benign  . BREAST BIOPSY Right 2005   core bx. benign  . BREAST BIOPSY Left 2000?  benign  . BREAST CYST EXCISION Left 1990   incision and draniage of abscess  . CAROTID PTA/STENT INTERVENTION Left 12/04/2019   Procedure: CAROTID PTA/STENT INTERVENTION;  Surgeon: Katha Cabal, MD;  Location: [facility];  Service: Cardiovascular;  Laterality: Left;  . CHOLECYSTECTOMY    . COLONOSCOPY  2003   Dr. Vira Agar. Hyperplastic polyps; Single polyp excision from rectum  . CT Scan of Brain  08/20/2013   Mild diffuse cortical atrophy. Mid chronic ischemic white matter disease. Wide neck basilar tip aneurysm 6x6x50mm on follow up CTA  . [place]   . INCISION AND DRAINAGE BREAST ABSCESS Left 1990  . TONSILLECTOMY  1952  . UPPER GI ENDOSCOPY  02/26/2002   Dr. Tiffany Kocher; Gastritis. Single gastric ectasia    Prior to Admission medications   Medication Sig Start Date End Date Taking? Authorizing Provider  albuterol (VENTOLIN HFA) 108 (90 BASE) MCG/ACT inhaler Inhale 2 puffs into the lungs. Every 4-6 hours as needed 08/31/11   [provider]  alendronate (FOSAMAX) 70 MG tablet Take 1 tablet (70 mg total) by mouth once a week. Take with a full glass of water on an empty stomach. Patient taking differently: Take 70 mg by mouth once a week. Take with a full glass of water on an empty stomach. (SUNDAYS) 12/10/18   Birdie Sons, MD  amLODipine (NORVASC) 5 MG tablet Take 1 tablet (5 mg total) by mouth daily. 12/06/19   Nolberto Hanlon, MD  aspirin 81 MG tablet Take 81 mg by mouth daily.    [provider]  Calcium-Magnesium-Vitamin D (CALCIUM 500 PO) Take 1 tablet by mouth 2 (two) times daily. 06/14/07   [provider]  citalopram (CELEXA) 20 MG tablet TAKE 1 TABLET EVERY DAY 02/22/16   [provider]  clopidogrel (PLAVIX) 75 MG tablet Take 1 tablet (75 mg total) by mouth daily. 12/06/19   Nolberto Hanlon, MD  colchicine 0.6 MG tablet Take 2 tablets (1.2 mg) on first day of gout flare. Then, take 1 tablet (0.6mg ) daily until resolved. 12/08/19   Birdie Sons, MD  Fluticasone-Salmeterol (ADVAIR) 250-50 MCG/DOSE AEPB Inhale 1 puff into the lungs 2 (two) times daily.    [provider]  furosemide (LASIX) 20 MG tablet TAKE 1 TABLET TWICE DAILY AS NEEDED  FOR  SWELLING Patient taking differently: Take 20 mg by mouth daily.  06/24/19   Birdie Sons, MD  gabapentin (NEURONTIN) 100 MG capsule Take 1 capsule (100 mg total) by mouth 2 (two) times daily. 03/24/19   Birdie Sons, MD  levothyroxine (SYNTHROID) 25 MCG tablet TAKE 1 AND 1/2 TABLETS EVERY DAY BEFORE BREAKFAST 06/06/19   Birdie Sons, MD  metoprolol  succinate (TOPROL-XL) 50 MG 24 hr tablet TAKE 1/2 TABLET EVERY DAY 02/27/19   Birdie Sons, MD  Multiple Vitamins-Minerals (PRESERVISION AREDS 2+MULTI VIT) CAPS Take 1 capsule by mouth 2 (two) times daily.    [provider]  OXYGEN Inhale into the lungs Nightly.    [provider]  pantoprazole (PROTONIX) 40 MG tablet TAKE 1 TABLET EVERY DAY 07/24/19   Birdie Sons, MD  pravastatin (PRAVACHOL) 40 MG tablet TAKE 1 TABLET (40 MG TOTAL) BY MOUTH AT BEDTIME. 12/31/18   Birdie Sons, MD  roflumilast (DALIRESP) 500 MCG TABS tablet Take 500 mcg by mouth daily.    [provider]  tiotropium (SPIRIVA) 18 MCG inhalation capsule Place 18 mcg into inhaler and inhale daily.  [provider]  traMADol (ULTRAM) 50 MG tablet TAKE 1 TABLET BY MOUTH EVERY 8 HOURS Patient taking differently: Take 50 mg by mouth every 8 (eight) hours as needed.  11/27/19   Birdie Sons, MD    Allergies Flexeril  Foy [place] hcl], Reglan [metoclopramide], Relafen [nabumetone], Risperdal  [risperidone], Sulfa antibiotics, Tape, Triamterene-hctz, and Hctz [hydrochlorothiazide]  Family History  Problem Relation Age of Onset  . Heart disease Father   . Congestive Heart Failure Father   . Heart disease Mother   . Congestive Heart Failure Mother   . Cancer Brother        lung  . Lung cancer Brother   . Diabetes Daughter   . Diabetes Daughter   . Breast cancer Neg Hx     Social History Social History   Tobacco Use  . Smoking status: Former Smoker    Packs/day: 1.00    Years: 30.00    Pack years: 30.00    Types: Cigarettes  . Smokeless tobacco: Never Used  . Tobacco comment: quit around 1989  Substance Use Topics  . Alcohol use: No  . Drug use: No      Review of Systems Constitutional: No fever/chills, increased weakness, concern for near syncopal Eyes: No visual changes. ENT: No sore throat. Cardiovascular: Denies chest pain. Respiratory: Positive  shortness of breath Gastrointestinal: No abdominal pain.  No nausea, no vomiting.  No diarrhea.  No constipation. Genitourinary: Negative for dysuria. Musculoskeletal: Negative for back pain. Skin: Negative for rash. Neurological: Negative for headaches, focal weakness or numbness. All other ROS negative this was obtained through the daughter and patient ____________________________________________   PHYSICAL EXAM:  VITAL SIGNS: ED Triage Vitals  Enc Vitals Group     BP 12/16/19 1451 (!) 136/55     Pulse Rate 12/16/19 1451 84     Resp 12/16/19 1451 20     Temp 12/16/19 1451 98.5 F (36.9 C)     Temp Source 12/16/19 1451 Oral     SpO2 12/16/19 1451 93 %     Weight 12/16/19 1454 170 lb (77.1 kg)     Height 12/16/19 1454 5\' 1"  (1.549 m)     Head Circumference --      Peak Flow --      Pain Score 12/16/19 1451 0     Pain Loc --      Pain Edu? --      Excl. in [place]? --     Constitutional:  Well appearing and in no acute distress. Eyes: Conjunctivae are normal. EOMI. Head: Atraumatic. Nose: No congestion/rhinnorhea. Mouth/Throat: Mucous membranes are moist.   Neck: No stridor. Trachea Midline. FROM Cardiovascular: Normal rate, regular rhythm. Grossly normal heart sounds.  Good peripheral circulation. Respiratory: Slightly increased respiratory rate no retractions. Lungs CTAB.  100% on her 2 L Gastrointestinal: Soft and nontender. No distention. No abdominal bruits.  Musculoskeletal: No lower extremity tenderness nor edema.  No joint effusions. Neurologic:  Normal speech and language. No gross focal neurologic deficits are appreciated.  Skin:  Skin is warm, dry and intact. No rash noted. Psychiatric: Mood and affect are normal. Speech and behavior are normal. GU: Deferred   ____________________________________________   LABS (all labs ordered are listed, but only abnormal results are displayed)  Labs Reviewed  BASIC METABOLIC PANEL - Abnormal; Notable for the following  components:      Result Value   Glucose, Bld 118 (*)    All other components within normal limits  CBC -  Abnormal; Notable for the following components:   WBC 15.3 (*)    RBC 3.81 (*)    Hemoglobin 11.5 (*)    HCT 34.5 (*)    All other components within normal limits  TROPONIN I (HIGH SENSITIVITY) - Abnormal; Notable for the following components:   Troponin I (High Sensitivity) 1,923 (*)    All other components within normal limits  URINALYSIS, COMPLETE (UACMP) WITH MICROSCOPIC  CBG MONITORING, ED  TROPONIN I (HIGH SENSITIVITY)   ____________________________________________   ED ECG REPORT I, Vanessa [place], the attending physician, personally viewed and interpreted this ECG.  EKG is normal sinus rate of 88 with a right bundle branch block, no ST elevation, no T wave inversion  Right bundle branch block is similar to prior ____________________________________________  RADIOLOGY I, Vanessa [place], personally viewed and evaluated these images (plain radiographs) as part of my medical decision making, as well as reviewing the written report by the radiologist.  ED MD interpretation: Pending  Official radiology report(s): No results found.  ____________________________________________   PROCEDURES  Procedure(s) performed (including Critical Care):  Procedures   ____________________________________________   INITIAL IMPRESSION / ASSESSMENT AND PLAN / ED COURSE  Toni White was evaluated in Emergency Department on 12/16/2019 for the symptoms described in the history of present illness. She was evaluated in the context of the global COVID-19 pandemic, which necessitated consideration that the patient might be at risk for infection with the SARS-CoV-2 virus that causes COVID-19. Institutional protocols and algorithms that pertain to the evaluation of patients at risk for COVID-19 are in a state of rapid change based on information released by regulatory bodies including the CDC  and federal and state organizations. These policies and algorithms were followed during the patient's care in the ED.    Patient is an 84 year old who comes in with concerns for intermittent desaturations with trying to exert herself and having near syncope.  EKG without evidence of arrhythmia.  We will keep in the cardiac monitor.  Will get cardiac markers to evaluate for ACS.  Will get labs evaluate [place] abnormalities, AKI.  Given patient is immobility and recent hospitalization will get CT scan to evaluate for PE, pneumonia, effusion, worsening COPD disease  Initial troponin elevated at 1923.  Given near syncopal episodes this puts her at higher risk and patient will need to be admitted for cardiac monitoring.  However will get CT scan first to make sure no evidence of pulmonary embolism that could be causing demand ischemia.  Pt handed off to incoming team pending CT   ____________________________________________   FINAL CLINICAL IMPRESSION(S) / ED DIAGNOSES   Final diagnoses:  Near syncope      MEDICATIONS GIVEN DURING THIS VISIT:  Medications  sodium chloride flush (NS) 0.9 % injection 3 mL (has no administration in time range)     ED Discharge Orders    None       Note:  This document was prepared using Dragon voice recognition software and may include unintentional dictation errors.   Vanessa [place], MD 12/17/19 1538

## 2019-12-16 NOTE — ED Triage Notes (Signed)
Pt speaking with this RN in NAD, pt on 2L [place] and reports this is chronic for her. Pt slow to answer questions and is hard of hearing. When asking pt why she is here she states, "The EMT brought me and said they thought I needed to get checked out". Pt A&Ox4.

## 2019-12-16 NOTE — ED Provider Notes (Signed)
Unfortunately there was some discrepancy in labs.  Repeat troponin came back normal.  At this point unclear why there is significant discrepancy.  Will send a another tube of blood for repeat troponin and also add on a BMP given that if initial troponin was incorrect initial BMP would also be incorrect.   Nance Pear, MD 12/16/19 2326

## 2019-12-17 ENCOUNTER — Other Ambulatory Visit: Payer: Self-pay

## 2019-12-17 ENCOUNTER — Telehealth: Payer: Self-pay | Admitting: *Deleted

## 2019-12-17 DIAGNOSIS — R55 Syncope and collapse: Secondary | ICD-10-CM | POA: Diagnosis present

## 2019-12-17 DIAGNOSIS — J441 Chronic obstructive pulmonary disease with (acute) exacerbation: Secondary | ICD-10-CM | POA: Diagnosis present

## 2019-12-17 DIAGNOSIS — K219 Gastro-esophageal reflux disease without esophagitis: Secondary | ICD-10-CM

## 2019-12-17 DIAGNOSIS — J449 Chronic obstructive pulmonary disease, unspecified: Secondary | ICD-10-CM

## 2019-12-17 DIAGNOSIS — E039 Hypothyroidism, unspecified: Secondary | ICD-10-CM

## 2019-12-17 LAB — APTT: aPTT: 36 seconds (ref 24–36)

## 2019-12-17 LAB — BASIC METABOLIC PANEL
Anion gap: 12 (ref 5–15)
Anion gap: 12 (ref 5–15)
BUN: 10 mg/dL (ref 8–23)
BUN: 11 mg/dL (ref 8–23)
CO2: 31 mmol/L (ref 22–32)
CO2: 29 mmol/L (ref 22–32)
Calcium: 9.6 mg/dL (ref 8.9–10.3)
Calcium: 9.4 mg/dL (ref 8.9–10.3)
Chloride: 97 mmol/L — ABNORMAL LOW (ref 98–111)
Chloride: 100 mmol/L (ref 98–111)
Creatinine, Ser: 0.58 mg/dL (ref 0.44–1.00)
Creatinine, Ser: 0.53 mg/dL (ref 0.44–1.00)
GFR calc Af Amer: >60 mL/min (ref >60)
GFR calc Af Amer: >60 mL/min (ref >60)
GFR calc non Af Amer: >60 mL/min (ref >60)
GFR calc non Af Amer: >60 mL/min (ref >60)
Glucose, Bld: 100 mg/dL — ABNORMAL HIGH (ref 70–99)
Glucose, Bld: 88 mg/dL (ref 70–99)
Potassium: 3.8 mmol/L (ref 3.5–5.1)
Potassium: 3.8 mmol/L (ref 3.5–5.1)
Sodium: 140 mmol/L (ref 135–145)
Sodium: 141 mmol/L (ref 135–145)

## 2019-12-17 LAB — CBC
HCT: 39.7 % (ref 36.0–46.0)
HCT: 36.6 % (ref 36.0–46.0)
Hemoglobin: 13.1 g/dL (ref 12.0–15.0)
Hemoglobin: 11.9 g/dL — ABNORMAL LOW (ref 12.0–15.0)
MCH: 30.1 pg (ref 26.0–34.0)
MCH: 30.1 pg (ref 26.0–34.0)
MCHC: 33 g/dL (ref 30.0–36.0)
MCHC: 32.5 g/dL (ref 30.0–36.0)
MCV: 91.3 fL (ref 80.0–100.0)
MCV: 92.4 fL (ref 80.0–100.0)
Platelets: 345 10*3/uL (ref 150–400)
Platelets: 367 10*3/uL (ref 150–400)
RBC: 4.35 MIL/uL (ref 3.87–5.11)
RBC: 3.96 MIL/uL (ref 3.87–5.11)
RDW: 13.6 % (ref 11.5–15.5)
RDW: 13.6 % (ref 11.5–15.5)
WBC: 13.3 10*3/uL — ABNORMAL HIGH (ref 4.0–10.5)
WBC: 14.7 10*3/uL — ABNORMAL HIGH (ref 4.0–10.5)
nRBC: 0 % (ref 0.0–0.2)
nRBC: 0 % (ref 0.0–0.2)

## 2019-12-17 LAB — PROTIME-INR
INR: 1.1 (ref 0.8–1.2)
Prothrombin Time: 13.8 seconds (ref 11.4–15.2)

## 2019-12-17 LAB — TROPONIN I (HIGH SENSITIVITY)
Troponin I (High Sensitivity): 9 ng/L (ref <18)
Troponin I (High Sensitivity): 12 ng/L (ref <18)

## 2019-12-17 LAB — GLUCOSE, CAPILLARY: Glucose-Capillary: 82 mg/dL (ref 70–99)

## 2019-12-17 LAB — TSH: TSH: 1.207 u[IU]/mL (ref 0.350–4.500)

## 2019-12-17 MED ORDER — OCUVITE-LUTEIN PO CAPS
1.0000 | ORAL_CAPSULE | Freq: Two times a day (BID) | ORAL | Status: DC
  Administered 2019-12-17 – 2019-12-23 (×12): 1 via ORAL
  Filled 2019-12-17 (×15): qty 1

## 2019-12-17 MED ORDER — GABAPENTIN 100 MG PO CAPS
100.0000 mg | ORAL_CAPSULE | Freq: Two times a day (BID) | ORAL | Status: DC
  Administered 2019-12-17 – 2019-12-24 (×15): 100 mg via ORAL
  Filled 2019-12-17 (×17): qty 1

## 2019-12-17 MED ORDER — ASPIRIN 81 MG PO CHEW
81.0000 mg | CHEWABLE_TABLET | Freq: Every day | ORAL | Status: DC
  Administered 2019-12-17 – 2019-12-24 (×8): 81 mg via ORAL
  Filled 2019-12-17 (×9): qty 1

## 2019-12-17 MED ORDER — ALENDRONATE SODIUM 70 MG PO TABS: 70.0000 mg | ORAL_TABLET | ORAL | Status: DC

## 2019-12-17 MED ORDER — MAGNESIUM HYDROXIDE 400 MG/5ML PO SUSP
30.0000 mL | Freq: Every day | ORAL | Status: DC | PRN
  Filled 2019-12-17: qty 30

## 2019-12-17 MED ORDER — FUROSEMIDE 40 MG PO TABS
20.0000 mg | ORAL_TABLET | Freq: Every day | ORAL | Status: DC
  Administered 2019-12-17: 20 mg via ORAL
  Filled 2019-12-17: qty 1

## 2019-12-17 MED ORDER — SODIUM CHLORIDE 0.9% FLUSH
3.0000 mL | Freq: Two times a day (BID) | INTRAVENOUS | Status: DC
  Administered 2019-12-18 – 2019-12-24 (×13): 3 mL via INTRAVENOUS

## 2019-12-17 MED ORDER — METOPROLOL SUCCINATE ER 50 MG PO TB24: 25.0000 mg | ORAL_TABLET | Freq: Every day | ORAL | Status: DC

## 2019-12-17 MED ORDER — CITALOPRAM HYDROBROMIDE 20 MG PO TABS
20.0000 mg | ORAL_TABLET | Freq: Every day | ORAL | Status: DC
  Administered 2019-12-17 – 2019-12-24 (×8): 20 mg via ORAL
  Filled 2019-12-17 (×9): qty 1

## 2019-12-17 MED ORDER — METHYLPREDNISOLONE SODIUM SUCC 40 MG IJ SOLR
40.0000 mg | Freq: Two times a day (BID) | INTRAMUSCULAR | Status: DC
  Administered 2019-12-17 – 2019-12-18 (×3): 40 mg via INTRAVENOUS
  Filled 2019-12-17 (×2): qty 1

## 2019-12-17 MED ORDER — TRAMADOL HCL 50 MG PO TABS
50.0000 mg | ORAL_TABLET | Freq: Four times a day (QID) | ORAL | Status: DC | PRN
  Administered 2019-12-19: 50 mg via ORAL
  Filled 2019-12-17: qty 1

## 2019-12-17 MED ORDER — ROFLUMILAST 500 MCG PO TABS
500.0000 ug | ORAL_TABLET | Freq: Every day | ORAL | Status: DC
  Administered 2019-12-17: 500 ug via ORAL
  Filled 2019-12-17: qty 1

## 2019-12-17 MED ORDER — CLOPIDOGREL BISULFATE 75 MG PO TABS
75.0000 mg | ORAL_TABLET | Freq: Every day | ORAL | Status: DC
  Administered 2019-12-17 – 2019-12-24 (×8): 75 mg via ORAL
  Filled 2019-12-17 (×9): qty 1

## 2019-12-17 MED ORDER — ONDANSETRON HCL 4 MG/2ML IJ SOLN: 4.0000 mg | Freq: Four times a day (QID) | INTRAMUSCULAR | Status: DC | PRN

## 2019-12-17 MED ORDER — IPRATROPIUM-ALBUTEROL 0.5-2.5 (3) MG/3ML IN SOLN
3.0000 mL | Freq: Four times a day (QID) | RESPIRATORY_TRACT | Status: DC
  Administered 2019-12-17 – 2019-12-19 (×6): 3 mL via RESPIRATORY_TRACT
  Filled 2019-12-17 (×6): qty 3

## 2019-12-17 MED ORDER — ACETAMINOPHEN 325 MG PO TABS: 650.0000 mg | ORAL_TABLET | Freq: Four times a day (QID) | ORAL | Status: DC | PRN

## 2019-12-17 MED ORDER — TRAZODONE HCL 50 MG PO TABS: 25.0000 mg | ORAL_TABLET | Freq: Every evening | ORAL | Status: DC | PRN

## 2019-12-17 MED ORDER — ACETAMINOPHEN 650 MG RE SUPP: 650.0000 mg | Freq: Four times a day (QID) | RECTAL | Status: DC | PRN

## 2019-12-17 MED ORDER — ENOXAPARIN SODIUM 40 MG/0.4ML ~~LOC~~ SOLN
40.0000 mg | SUBCUTANEOUS | Status: DC
  Administered 2019-12-17 – 2019-12-24 (×8): 40 mg via SUBCUTANEOUS
  Filled 2019-12-17 (×9): qty 0.4

## 2019-12-17 MED ORDER — SODIUM CHLORIDE 0.9 % IV SOLN: INTRAVENOUS | Status: DC

## 2019-12-17 MED ORDER — AMLODIPINE BESYLATE 5 MG PO TABS
5.0000 mg | ORAL_TABLET | Freq: Every day | ORAL | Status: DC
  Administered 2019-12-17 – 2019-12-24 (×8): 5 mg via ORAL
  Filled 2019-12-17 (×9): qty 1

## 2019-12-17 MED ORDER — ACETYLCYSTEINE 20 % IN SOLN
4.0000 mL | Freq: Two times a day (BID) | RESPIRATORY_TRACT | Status: DC
  Administered 2019-12-17 – 2019-12-18 (×3): 4 mL via RESPIRATORY_TRACT
  Filled 2019-12-17 (×4): qty 4

## 2019-12-17 MED ORDER — PRAVASTATIN SODIUM 20 MG PO TABS
40.0000 mg | ORAL_TABLET | Freq: Every day | ORAL | Status: DC
  Administered 2019-12-17 – 2019-12-23 (×7): 40 mg via ORAL
  Filled 2019-12-17 (×6): qty 2
  Filled 2019-12-17: qty 1
  Filled 2019-12-17: qty 2

## 2019-12-17 MED ORDER — ALUM & MAG HYDROXIDE-SIMETH 200-200-20 MG/5ML PO SUSP: 30.0000 mL | Freq: Four times a day (QID) | ORAL | Status: DC | PRN

## 2019-12-17 MED ORDER — LEVOTHYROXINE SODIUM 25 MCG PO TABS
37.5000 ug | ORAL_TABLET | Freq: Every day | ORAL | Status: DC
  Administered 2019-12-17 – 2019-12-24 (×8): 37.5 ug via ORAL
  Filled 2019-12-17 (×2): qty 2
  Filled 2019-12-17 (×2): qty 0.5
  Filled 2019-12-17 (×5): qty 2

## 2019-12-17 MED ORDER — AZITHROMYCIN 500 MG PO TABS
500.0000 mg | ORAL_TABLET | Freq: Every day | ORAL | Status: AC
  Administered 2019-12-17 – 2019-12-21 (×5): 500 mg via ORAL
  Filled 2019-12-17 (×6): qty 1

## 2019-12-17 MED ORDER — ALBUTEROL SULFATE (2.5 MG/3ML) 0.083% IN NEBU
2.5000 mg | INHALATION_SOLUTION | RESPIRATORY_TRACT | Status: DC | PRN
  Administered 2019-12-17: 2.5 mg via RESPIRATORY_TRACT
  Filled 2019-12-17: qty 3

## 2019-12-17 MED ORDER — METHYLPREDNISOLONE SODIUM SUCC 125 MG IJ SOLR
INTRAMUSCULAR | Status: AC
  Filled 2019-12-17: qty 2

## 2019-12-17 MED ORDER — ONDANSETRON HCL 4 MG PO TABS: 4.0000 mg | ORAL_TABLET | Freq: Four times a day (QID) | ORAL | Status: DC | PRN

## 2019-12-17 NOTE — ED Notes (Signed)
Pt given meal tray. Pt able to assist minimally to eat. Food cut up for pt. Pt slowly eating bites of french toast.

## 2019-12-17 NOTE — H&P (Addendum)
[facility] NAME: Toni White    MR#:  [id number]  DATE OF BIRTH:  28-Sep-1935  DATE OF ADMISSION:  12/16/2019  PRIMARY CARE PHYSICIAN: Birdie Sons, MD   REQUESTING/REFERRING PHYSICIAN: Nance Pear, MD  CHIEF COMPLAINT:   Chief Complaint  Patient presents with  . Near Syncope    HISTORY OF PRESENT ILLNESS:  Toni White  is a 84 y.o. female with a known history of COPD, dyslipidemia, hypertension and TIA, presented to the emergency room with acute onset of presyncope.  The patient was sitting in her car getting ready to an appointment when she felt she was going to pass out.  She has been unsteady on her feet lately.  She denies any paresthesias or focal muscle weakness.  She has been having mild dyspnea and cough productive of clear sputum without wheezing or fever or chills.  No chest pain or palpitations.  She was noted to be hypoxemic with a pulse oximetry in the 80s and mild increased work of breathing.  This resolved with 2 L of O2 by nasal cannula.  Her near syncope episodes occur mainly with exertion.  No dysuria, oliguria or hematuria or flank pain.  Upon presentation to the emergency room blood pressure was 149/69 with otherwise normal vital signs except for pulse currently 93% on 2 L that was up to 100% later on..  Labs revealed high-sensitivity troponin I of 1923 that was apparently an accurate since repeat troponin came back 9 and later 12.  CBC showed leukocytosis of 15.3 and BMP was unremarkable.  Urinalysis showed 11-20 WBCs with hyaline casts noncontrasted head CT scan revealed extensive chronic microvascular ischemic disease with no acute abnormalities.  Chest CTA showed emphysema, atherosclerosis small hiatal hernia with no evidence for pulmonary embolism.  The patient will be admitted to an observation progressive cardiac unit bed for further evaluation and management.  PAST MEDICAL HISTORY:   Past Medical History:  Diagnosis  Date  . COPD (chronic obstructive pulmonary disease) ([place])   . DNR (do not resuscitate) 11/2019   DNR/DNI, confirmed in person by patient and her daughter/POA  . History of chicken pox   . Hyperlipidemia   . Hypertension   . Ulcer     PAST SURGICAL HISTORY:   Past Surgical History:  Procedure Laterality Date  . ABDOMINAL HYSTERECTOMY  2003   with BSO due to bleeding  . APPENDECTOMY    . BREAST BIOPSY Left 2005   stereo breast biopsy benign  . BREAST BIOPSY Right 2005   core bx. benign  . BREAST BIOPSY Left 2000?   benign  . BREAST CYST EXCISION Left 1990   incision and draniage of abscess  . CAROTID PTA/STENT INTERVENTION Left 12/04/2019   Procedure: CAROTID PTA/STENT INTERVENTION;  Surgeon: Katha Cabal, MD;  Location: [facility];  Service: Cardiovascular;  Laterality: Left;  . CHOLECYSTECTOMY    . COLONOSCOPY  2003   Dr. Vira Agar. Hyperplastic polyps; Single polyp excision from rectum  . CT Scan of Brain  08/20/2013   Mild diffuse cortical atrophy. Mid chronic ischemic white matter disease. Wide neck basilar tip aneurysm 6x6x35mm on follow up CTA  . [place]  . INCISION AND DRAINAGE BREAST ABSCESS Left 1990  . TONSILLECTOMY  1952  . UPPER GI ENDOSCOPY  02/26/2002   Dr. Tiffany Kocher; Gastritis. Single gastric ectasia    SOCIAL HISTORY:   Social History   Tobacco Use  . Smoking  status: Former Smoker    Packs/day: 1.00    Years: 30.00    Pack years: 30.00    Types: Cigarettes  . Smokeless tobacco: Never Used  . Tobacco comment: quit around 1989  Substance Use Topics  . Alcohol use: No    FAMILY HISTORY:   Family History  Problem Relation Age of Onset  . Heart disease Father   . Congestive Heart Failure Father   . Heart disease Mother   . Congestive Heart Failure Mother   . Cancer Brother        lung  . Lung cancer Brother   . Diabetes Daughter   . Diabetes Daughter   . Breast cancer Neg Hx     DRUG ALLERGIES:    Allergies  Allergen Reactions  . Flexeril  [Cyclobenzaprine Hcl]     Confusion  . Reglan [Metoclopramide] Hives  . Relafen [Nabumetone] Hives  . Risperdal  [Risperidone]     Confusion Rash  . Sulfa Antibiotics Hives  . Tape Other (See Comments)    Per patient pulls her skin off.  . Triamterene-Hctz     Hypercalcemia  . Hctz [Hydrochlorothiazide] Palpitations    REVIEW OF SYSTEMS:   ROS As per history of present illness. All pertinent systems were reviewed above. Constitutional,  HEENT, cardiovascular, respiratory, GI, GU, musculoskeletal, neuro, psychiatric, endocrine,  integumentary and hematologic systems were reviewed and are otherwise  negative/unremarkable except for positive findings mentioned above in the HPI.   MEDICATIONS AT HOME:   Prior to Admission medications   Medication Sig Start Date End Date Taking? Authorizing Provider  albuterol (VENTOLIN HFA) 108 (90 BASE) MCG/ACT inhaler Inhale 2 puffs into the lungs. Every 4-6 hours as needed 08/31/11   [provider]  alendronate (FOSAMAX) 70 MG tablet Take 1 tablet (70 mg total) by mouth once a week. Take with a full glass of water on an empty stomach. Patient taking differently: Take 70 mg by mouth once a week. Take with a full glass of water on an empty stomach. (SUNDAYS) 12/10/18   Birdie Sons, MD  amLODipine (NORVASC) 5 MG tablet Take 1 tablet (5 mg total) by mouth daily. 12/06/19   Nolberto Hanlon, MD  aspirin 81 MG tablet Take 81 mg by mouth daily.    [provider]  Calcium-Magnesium-Vitamin D (CALCIUM 500 PO) Take 1 tablet by mouth 2 (two) times daily. 06/14/07   [provider]  citalopram (CELEXA) 20 MG tablet TAKE 1 TABLET EVERY DAY 02/22/16   [provider]  clopidogrel (PLAVIX) 75 MG tablet Take 1 tablet (75 mg total) by mouth daily. 12/06/19   Nolberto Hanlon, MD  colchicine 0.6 MG tablet Take 2 tablets (1.2 mg) on first day of gout flare. Then, take 1 tablet (0.6mg ) daily  until resolved. 12/08/19   Birdie Sons, MD  Fluticasone-Salmeterol (ADVAIR) 250-50 MCG/DOSE AEPB Inhale 1 puff into the lungs 2 (two) times daily.    [provider]  furosemide (LASIX) 20 MG tablet TAKE 1 TABLET TWICE DAILY AS NEEDED  FOR  SWELLING Patient taking differently: Take 20 mg by mouth daily.  06/24/19   Birdie Sons, MD  gabapentin (NEURONTIN) 100 MG capsule Take 1 capsule (100 mg total) by mouth 2 (two) times daily. 03/24/19   Birdie Sons, MD  levothyroxine (SYNTHROID) 25 MCG tablet TAKE 1 AND 1/2 TABLETS EVERY DAY BEFORE BREAKFAST 06/06/19   Birdie Sons, MD  metoprolol succinate (TOPROL-XL) 50 MG 24 hr tablet TAKE  1/2 TABLET EVERY DAY 02/27/19   Birdie Sons, MD  Multiple Vitamins-Minerals (PRESERVISION AREDS 2+MULTI VIT) CAPS Take 1 capsule by mouth 2 (two) times daily.    [provider]  OXYGEN Inhale into the lungs Nightly.    [provider]  pantoprazole (PROTONIX) 40 MG tablet TAKE 1 TABLET EVERY DAY 07/24/19   Birdie Sons, MD  pravastatin (PRAVACHOL) 40 MG tablet TAKE 1 TABLET (40 MG TOTAL) BY MOUTH AT BEDTIME. 12/31/18   Birdie Sons, MD  roflumilast (DALIRESP) 500 MCG TABS tablet Take 500 mcg by mouth daily.    [provider]  tiotropium (SPIRIVA) 18 MCG inhalation capsule Place 18 mcg into inhaler and inhale daily.    [provider]  traMADol (ULTRAM) 50 MG tablet TAKE 1 TABLET BY MOUTH EVERY 8 HOURS Patient taking differently: Take 50 mg by mouth every 8 (eight) hours as needed.  11/27/19   Birdie Sons, MD      VITAL SIGNS:  Blood pressure (!) 141/62, pulse 92, temperature 98.5 F (36.9 C), temperature source Oral, resp. rate (!) 23, height 5\' 1"  (1.549 m), weight 77.1 kg, SpO2 98 %.  PHYSICAL EXAMINATION:  Physical Exam  GENERAL:  84 y.o.-year-old patient lying in the bed with no acute distress.  EYES: Pupils equal, round, reactive to light and accommodation. No scleral icterus.  Extraocular muscles intact.  HEENT: Head atraumatic, normocephalic. Oropharynx and nasopharynx clear.  NECK:  Supple, no jugular venous distention. No thyroid enlargement, no tenderness.  LUNGS: Normal breath sounds bilaterally, no wheezing, rales,rhonchi or crepitation. No use of accessory muscles of respiration.  CARDIOVASCULAR: Regular rate and rhythm, S1, S2 normal. No murmurs, rubs, or gallops.  ABDOMEN: Soft, nondistended, nontender. Bowel sounds present. No organomegaly or mass.  EXTREMITIES: 1+ bilateral lower extremity pitting edema, with no cyanosis, or clubbing.  NEUROLOGIC: Cranial nerves II through XII are intact. Muscle strength 5/5 in all extremities. Sensation intact. Gait not checked.  PSYCHIATRIC: The patient is alert and oriented x 3.  Normal affect and good eye contact. SKIN: No obvious rash, lesion, or ulcer.   LABORATORY PANEL:   CBC Recent Labs  Lab 12/16/19 1504  WBC 15.3*  HGB 11.5*  HCT 34.5*  PLT 362   ------------------------------------------------------------------------------------------------------------------  Chemistries  Recent Labs  Lab 12/16/19 2311  NA 140  K 3.8  CL 97*  CO2 31  GLUCOSE 100*  BUN 10  CREATININE 0.58  CALCIUM 9.6   ------------------------------------------------------------------------------------------------------------------  Cardiac Enzymes No results for input(s): TROPONINI in the last 168 hours. ------------------------------------------------------------------------------------------------------------------  RADIOLOGY:  CT Head Wo Contrast  Result Date: 12/16/2019 CLINICAL DATA:  Near syncopal episode today. EXAM: CT HEAD WITHOUT CONTRAST TECHNIQUE: Contiguous axial images were obtained from the base of the skull through the vertex without intravenous contrast. COMPARISON:  Brain MRI 12/02/2019.  Head CT scan 12/01/2019. FINDINGS: Brain: No evidence of acute infarction, hemorrhage, hydrocephalus, extra-axial  collection or mass lesion/mass effect. Extensive chronic microvascular ischemic change again seen. Lacunar infarctions in the basal ganglia and thalami also again seen. Vascular: No hyperdense vessel or unexpected calcification. Skull: Intact.  No focal lesion. Sinuses/Orbits: Negative. Other: None. IMPRESSION: No acute abnormality. Extensive chronic microvascular ischemic disease. Electronically Signed   By: Inge Rise M.D.   On: 12/16/2019 21:40   CT Angio Chest PE W and/or Wo Contrast  Result Date: 12/16/2019 CLINICAL DATA:  Shortness of breath, near syncope EXAM: CT ANGIOGRAPHY CHEST WITH CONTRAST TECHNIQUE: Multidetector CT imaging of the chest was  performed using the standard protocol during bolus administration of intravenous contrast. Multiplanar CT image reconstructions and MIPs were obtained to evaluate the vascular anatomy. CONTRAST:  20mL OMNIPAQUE IOHEXOL 350 MG/ML SOLN COMPARISON:  06/30/2011, 12/05/2019 FINDINGS: Cardiovascular: This is a technically adequate evaluation of the pulmonary vasculature. No filling defects or pulmonary emboli. The heart is normal without pericardial effusion. There is extensive atherosclerosis of the thoracic aorta, with no aneurysm or dissection. Significant atherosclerosis throughout the coronary vasculature greatest in the LAD distribution. Mediastinum/Nodes: No enlarged mediastinal, hilar, or axillary lymph nodes. Thyroid gland, trachea, and esophagus demonstrate no significant findings. Small hiatal hernia is noted. Lungs/Pleura: Upper lobe predominant emphysema is seen, with scattered areas of air trapping. No airspace disease, effusion, or pneumothorax. Central airways are patent. Upper Abdomen: No acute abnormality. Musculoskeletal: No acute or destructive bony lesions. Reconstructed images demonstrate no additional findings. Review of the MIP images confirms the above findings. IMPRESSION: 1. No CT evidence of pulmonary embolism. 2. Small hiatal hernia.  3. Aortic Atherosclerosis (ICD10-I70.0) and Emphysema (ICD10-J43.9). Electronically Signed   By: Randa Ngo M.D.   On: 12/16/2019 21:40      IMPRESSION AND PLAN:  1.  Presyncope. -The patient will be admitted to an observation progressive cardiac unit bed. -We will monitor her arrhythmias. -We will follow orthostatics every 12 hours. -We will monitor her hypoxemia with O2 protocol. -A 2D echo was recently done on 12/02/19 revealed EF of 65 to 70% with grade 1 diastolic dysfunction. -Will obtain bilateral carotid Doppler.  3.  Hypothyroidism. -We will continue Synthroid and check TSH level.  4.   COPD. -We will continue her Spiriva, her Daliresp and as needed albuterol and hold off Advair Diskus.  5. GERD without esophagitis. -We will continue PPI therapy.  6.  Depression/anxiety. -We will continue Celexa.  7.  Dyslipidemia. -We will continue statin therapy.  8.  DVT prophylaxis. -Subcutaneous Lovenox.    All the records are reviewed and case discussed with ED provider. The plan of care was discussed in details with the patient (and family). I answered all questions. The patient agreed to proceed with the above mentioned plan. Further management will depend upon hospital course.   CODE STATUS: Full code  Status is: Observation  The patient remains OBS appropriate and will d/c before 2 midnights.  Dispo: The patient is from: Home              Anticipated d/c is to: Home              Anticipated d/c date is: 1 day              Patient currently is not medically stable to d/c.   TOTAL TIME TAKING CARE OF THIS PATIENT: 55 minutes.    Christel Mormon M.D on 12/17/2019 at 5:07 AM  [facility]   From 7 PM-7 AM, contact night-coverage [email or web address]  CC: Primary care physician; Birdie Sons, MD   Note: This dictation was prepared with Dragon dictation along with smaller phrase technology. Any transcriptional errors that result from this process are  unintentional.

## 2019-12-17 NOTE — ED Notes (Signed)
Pt able to stand with assistance for ortho vitals. Pt denies any dizziness but pt becomes tachypnic with standing. Pt O2 sats stay at 99%.

## 2019-12-17 NOTE — ED Notes (Signed)
Dr. Danford at bedside  

## 2019-12-17 NOTE — Telephone Encounter (Signed)
FYI!!  Toni White (Patient) Toni White (Patient) General - Other  Reason for CRM: Jeneen Rinks with [facility] called to inform Dr Caryn Section that there was a missed PT visit with patient on 12/16/19 due to patient going to the hospital and being admitted. Please advise Ph# [phone]

## 2019-12-17 NOTE — ED Notes (Signed)
Synthroid and Lovenox not given; not verified by pharmacy at this time.

## 2019-12-17 NOTE — Consult Note (Signed)
Pulmonary Medicine          Date: 12/17/2019,   MRN# [id number] Toni White Apr 24, 1936     AdmissionWeight: 77.1 kg                 CurrentWeight: 77.1 kg  Referring physician: Dr Loleta Books    CHIEF COMPLAINT:   Worsening shortness of breath at rest.    HISTORY OF PRESENT ILLNESS   Toni White  is a 84 y.o. female with a known history of COPD, dyslipidemia, hypertension and TIA, presented to the emergency room with worsening SOB/dyspnea and presyncope. Patient shares that she was hospitalized only 2 weeks ago with acute COPD exacerbation. She relates that she felt dizzines and presyncopal which promted her to come to ED. She has been having mild dyspnea and cough productive of clear sputum without wheezing or fever or chills.  No chest pain or palpitations.  She was noted to be hypoxemic with a pulse oximetry in the 80s and mild increased work of breathing.  This resolved with 2 L of O2 by nasal cannula which is her home dose of O2.Upon presentation to the emergency room blood pressure was 149/69 with otherwise normal vital signs except for pulse currently 93% on 2 L that was up to 100% later on.. CBC showed leukocytosis of 15.3 post prednisone x1wk and BMP was unremarkable. I reviewed her CT chest today which is remarkable for centrilobular emphysema and chronic bronchitic COPD.   PAST MEDICAL HISTORY   Past Medical History:  Diagnosis Date  . COPD (chronic obstructive pulmonary disease) ([place])   . DNR (do not resuscitate) 11/2019   DNR/DNI, confirmed in person by patient and her daughter/POA  . History of chicken pox   . Hyperlipidemia   . Hypertension   . Ulcer      SURGICAL HISTORY   Past Surgical History:  Procedure Laterality Date  . ABDOMINAL HYSTERECTOMY  2003   with BSO due to bleeding  . APPENDECTOMY    . BREAST BIOPSY Left 2005   stereo breast biopsy benign  . BREAST BIOPSY Right 2005   core bx. benign  . BREAST BIOPSY Left 2000?   benign  .  BREAST CYST EXCISION Left 1990   incision and draniage of abscess  . CAROTID PTA/STENT INTERVENTION Left 12/04/2019   Procedure: CAROTID PTA/STENT INTERVENTION;  Surgeon: Katha Cabal, MD;  Location: [facility];  Service: Cardiovascular;  Laterality: Left;  . CHOLECYSTECTOMY    . COLONOSCOPY  2003   Dr. Vira Agar. Hyperplastic polyps; Single polyp excision from rectum  . CT Scan of Brain  08/20/2013   Mild diffuse cortical atrophy. Mid chronic ischemic white matter disease. 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 [place]  . INCISION AND DRAINAGE BREAST ABSCESS Left 1990  . TONSILLECTOMY  1952  . UPPER GI ENDOSCOPY  02/26/2002   Dr. Tiffany Kocher; Gastritis. Single gastric ectasia     FAMILY HISTORY   Family History  Problem Relation Age of Onset  . Heart disease Father   . Congestive Heart Failure Father   . Heart disease Mother   . Congestive Heart Failure Mother   . Cancer Brother        lung  . Lung cancer Brother   . Diabetes Daughter   . Diabetes Daughter   . Breast cancer Neg Hx      SOCIAL HISTORY   Social History   Tobacco Use  .  Smoking status: Former Smoker    Packs/day: 1.00    Years: 30.00    Pack years: 30.00    Types: Cigarettes  . Smokeless tobacco: Never Used  . Tobacco comment: quit around 1989  Substance Use Topics  . Alcohol use: No  . Drug use: No     MEDICATIONS    Home Medication:  Current Outpatient Rx  . Order #: [id number] Class: Historical Med  . Order #: [id number] Class: Normal  . Order #: [id number] Class: Normal  . Order #: [id number] Class: Historical Med  . Order #: [id number] Class: Historical Med  . Order #: [id number] Class: Normal  . Order #: [id number] Class: Normal  . Order #: [id number] Class: Historical Med  . Order #: [id number] Class: Normal  . Order #: [id number] Class: Normal  . Order #: [id number] Class: Normal  . Order #: [id number] Class: Normal  . Order #: [id number] Class:  Historical Med  . Order #: [id number] Class: Normal  . Order #: [id number] Class: Normal  . Order #: [id number] Class: Historical Med  . Order #: [id number] Class: Historical Med  . Order #: [id number] Class: Normal    Current Medication:  Current Facility-Administered Medications:  .  0.9 %  sodium chloride infusion, , Intravenous, Continuous, Mansy, Jan A, MD, Last Rate: 75 mL/hr at 12/17/19 0632, New Bag at 12/17/19 [id number] .  acetaminophen (TYLENOL) tablet 650 mg, 650 mg, Oral, Q6H PRN **OR** acetaminophen (TYLENOL) suppository 650 mg, 650 mg, Rectal, Q6H PRN, Mansy, Jan A, MD .  albuterol (PROVENTIL) (2.5 MG/3ML) 0.083% nebulizer solution 2.5 mg, 2.5 mg, Inhalation, Q4H PRN, Mansy, Jan A, MD, 2.5 mg at 12/17/19 1128 .  alum & mag hydroxide-simeth (MAALOX/MYLANTA) 200-200-20 MG/5ML suspension 30 mL, 30 mL, Oral, Q6H PRN, Mansy, Jan A, MD .  amLODipine (NORVASC) tablet 5 mg, 5 mg, Oral, Daily, Mansy, Jan A, MD, 5 mg at 12/17/19 0937 .  aspirin chewable tablet 81 mg, 81 mg, Oral, Daily, Mansy, Jan A, MD, 81 mg at 12/17/19 0937 .  citalopram (CELEXA) tablet 20 mg, 20 mg, Oral, Daily, Mansy, Jan A, MD, 20 mg at 12/17/19 0937 .  clopidogrel (PLAVIX) tablet 75 mg, 75 mg, Oral, Daily, Mansy, Jan A, MD, 75 mg at 12/17/19 0937 .  enoxaparin (LOVENOX) injection 40 mg, 40 mg, Subcutaneous, Q24H, Mansy, Jan A, MD, 40 mg at 12/17/19 0937 .  gabapentin (NEURONTIN) capsule 100 mg, 100 mg, Oral, BID, Mansy, Jan A, MD, 100 mg at 12/17/19 0937 .  ipratropium-albuterol (DUONEB) 0.5-2.5 (3) MG/3ML nebulizer solution 3 mL, 3 mL, Nebulization, Q6H, Danford, Suann Larry, MD .  levothyroxine (SYNTHROID) tablet 37.5 mcg, 37.5 mcg, Oral, Q0600, Mansy, Jan A, MD, 37.5 mcg at 12/17/19 0903 .  magnesium hydroxide (MILK OF MAGNESIA) suspension 30 mL, 30 mL, Oral, Daily PRN, Mansy, Jan A, MD .  methylPREDNISolone sodium succinate (SOLU-MEDROL) 125 mg/2 mL injection, , , ,  .  methylPREDNISolone sodium succinate (SOLU-MEDROL) 40  mg/mL injection 40 mg, 40 mg, Intravenous, Q12H, Danford, Suann Larry, MD, 40 mg at 12/17/19 1126 .  multivitamin-lutein (OCUVITE-LUTEIN) capsule 1 capsule, 1 capsule, Oral, BID, Mansy, Jan A, MD, 1 capsule at 12/17/19 0937 .  ondansetron (ZOFRAN) tablet 4 mg, 4 mg, Oral, Q6H PRN **OR** ondansetron (ZOFRAN) injection 4 mg, 4 mg, Intravenous, Q6H PRN, Mansy, Jan A, MD .  pravastatin (PRAVACHOL) tablet 40 mg, 40 mg, Oral, QHS, Mansy, Jan A, MD .  roflumilast [place]) tablet 500 mcg, 500 mcg, Oral, Daily, Mansy, Jan A, MD, 500 mcg at 12/17/19 [id number] .  sodium chloride flush (NS) 0.9 % injection  3 mL, 3 mL, Intravenous, Once, Duffy Bruce, MD .  sodium chloride flush (NS) 0.9 % injection 3 mL, 3 mL, Intravenous, Q12H, Mansy, Jan A, MD .  traMADol (ULTRAM) tablet 50 mg, 50 mg, Oral, Q6H PRN, Mansy, Jan A, MD  Current Outpatient Medications:  .  albuterol (VENTOLIN HFA) 108 (90 BASE) MCG/ACT inhaler, Inhale 2 puffs into the lungs. Every 4-6 hours as needed, Disp: , Rfl:  .  alendronate (FOSAMAX) 70 MG tablet, Take 1 tablet (70 mg total) by mouth once a week. Take with a full glass of water on an empty stomach. (Patient taking differently: Take 70 mg by mouth once a week. Take with a full glass of water on an empty stomach. (SUNDAYS)), Disp: 12 tablet, Rfl: 4 .  amLODipine (NORVASC) 5 MG tablet, Take 1 tablet (5 mg total) by mouth daily., Disp: 30 tablet, Rfl: 0 .  aspirin 81 MG tablet, Take 81 mg by mouth daily., Disp: , Rfl:  .  Calcium-Magnesium-Vitamin D (CALCIUM 500 PO), Take 1 tablet by mouth 2 (two) times daily., Disp: , Rfl:  .  clopidogrel (PLAVIX) 75 MG tablet, Take 1 tablet (75 mg total) by mouth daily., Disp: 30 tablet, Rfl: 0 .  colchicine 0.6 MG tablet, Take 2 tablets (1.2 mg) on first day of gout flare. Then, take 1 tablet (0.6mg ) daily until resolved., Disp: 20 tablet, Rfl: 1 .  Fluticasone-Salmeterol (ADVAIR) 250-50 MCG/DOSE AEPB, Inhale 1 puff into the lungs 2 (two) times daily.,  Disp: , Rfl:  .  furosemide (LASIX) 20 MG tablet, TAKE 1 TABLET TWICE DAILY AS NEEDED  FOR  SWELLING (Patient taking differently: Take 20 mg by mouth daily. ), Disp: 90 tablet, Rfl: 4 .  gabapentin (NEURONTIN) 100 MG capsule, Take 1 capsule (100 mg total) by mouth 2 (two) times daily., Disp: 180 capsule, Rfl: 3 .  levothyroxine (SYNTHROID) 25 MCG tablet, TAKE 1 AND 1/2 TABLETS EVERY DAY BEFORE BREAKFAST, Disp: 135 tablet, Rfl: 4 .  metoprolol succinate (TOPROL-XL) 50 MG 24 hr tablet, TAKE 1/2 TABLET EVERY DAY, Disp: 45 tablet, Rfl: 5 .  Multiple Vitamins-Minerals (PRESERVISION AREDS 2+MULTI VIT) CAPS, Take 1 capsule by mouth 2 (two) times daily., Disp: , Rfl:  .  pantoprazole (PROTONIX) 40 MG tablet, TAKE 1 TABLET EVERY DAY, Disp: 90 tablet, Rfl: 3 .  pravastatin (PRAVACHOL) 40 MG tablet, TAKE 1 TABLET (40 MG TOTAL) BY MOUTH AT BEDTIME., Disp: 90 tablet, Rfl: 4 .  roflumilast (DALIRESP) 500 MCG TABS tablet, Take 500 mcg by mouth daily., Disp: , Rfl:  .  tiotropium (SPIRIVA) 18 MCG inhalation capsule, Place 18 mcg into inhaler and inhale daily., Disp: , Rfl:  .  traMADol (ULTRAM) 50 MG tablet, TAKE 1 TABLET BY MOUTH EVERY 8 HOURS, Disp: 30 tablet, Rfl: 5    ALLERGIES   Flexeril  [cyclobenzaprine hcl], Reglan [metoclopramide], Relafen [nabumetone], Risperdal  [risperidone], Sulfa antibiotics, Tape, Triamterene-hctz, and Hctz [hydrochlorothiazide]     REVIEW OF SYSTEMS    Review of Systems:  Gen:  Denies  fever, sweats, chills weigh loss  HEENT: Denies blurred vision, double vision, ear pain, eye pain, hearing loss, nose bleeds, sore throat Cardiac:  No dizziness, chest pain or heaviness, chest tightness,edema Resp:   Denies cough or sputum porduction, shortness of breath,wheezing, hemoptysis,  Gi: Denies swallowing difficulty, stomach pain, nausea or vomiting, diarrhea, constipation, bowel incontinence Gu:  Denies bladder incontinence, burning urine Ext:   Denies Joint pain, stiffness  or swelling Skin: Denies  skin rash,  easy bruising or bleeding or hives Endoc:  Denies polyuria, polydipsia , polyphagia or weight change Psych:   Denies depression, insomnia or hallucinations   Other:  All other systems negative   VS: BP (!) 126/104   Pulse (!) 102   Temp 98.5 F (36.9 C) (Oral)   Resp 18   Ht 5\' 1"  (1.549 m)   Wt 77.1 kg   SpO2 98%   BMI 32.12 kg/m      PHYSICAL EXAM    GENERAL:NAD, no fevers, chills, no weakness no fatigue HEAD: Normocephalic, atraumatic.  EYES: Pupils equal, round, reactive to light. Extraocular muscles intact. No scleral icterus.  MOUTH: Moist mucosal membrane. Dentition intact. No abscess noted.  EAR, NOSE, THROAT: Clear without exudates. No external lesions.  NECK: Supple. No thyromegaly. No nodules. No JVD.  PULMONARY: Mildly ronchorous breath sounds.  CARDIOVASCULAR: S1 and S2. Regular rate and rhythm. No murmurs, rubs, or gallops. No edema. Pedal pulses 2+ bilaterally.  GASTROINTESTINAL: Soft, nontender, nondistended. No masses. Positive bowel sounds. No hepatosplenomegaly.  MUSCULOSKELETAL: No swelling, clubbing, or edema. Range of motion full in all extremities.  NEUROLOGIC: Cranial nerves II through XII are intact. No gross focal neurological deficits. Sensation intact. Reflexes intact.  SKIN: No ulceration, lesions, rashes, or cyanosis. Skin warm and dry. Turgor intact.  PSYCHIATRIC: Mood, affect within normal limits. The patient is awake, alert and oriented x 3. Insight, judgment intact.       IMAGING    CT ANGIO HEAD W OR WO CONTRAST  Result Date: 12/02/2019 CLINICAL DATA:  Carotid artery stenosis. Recent slurred speech and mental status changes. Basilar tip aneurysm. Left carotid stenosis by ultrasound. EXAM: CT ANGIOGRAPHY HEAD AND NECK TECHNIQUE: Multidetector CT imaging of the head and neck was performed using the standard protocol during bolus administration of intravenous contrast. Multiplanar CT image  reconstructions and MIPs were obtained to evaluate the vascular anatomy. Carotid stenosis measurements (when applicable) are obtained utilizing NASCET criteria, using the distal internal carotid diameter as the denominator. CONTRAST:  74mL OMNIPAQUE IOHEXOL 350 MG/ML SOLN COMPARISON:  MRI same day. Ultrasound same day. MRI 08/26/2013. CT angiography 08/19/2013. FINDINGS: CT HEAD FINDINGS Brain: Brain atrophy with chronic small-vessel ischemic changes affecting the pons, thalami, basal ganglia and hemispheric white matter. No sign of acute infarction, mass lesion, hemorrhage, hydrocephalus or extra-axial collection. Vascular: There is atherosclerotic calcification of the major vessels at the base of the brain. 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 See below. Skull: Negative Sinuses: Clear sinuses. Orbits: Lens calcification on the right. Review of the MIP images confirms the above findings CTA NECK FINDINGS Aortic arch: Aortic atherosclerotic calcification. No aneurysm or dissection. Branching pattern is normal without flow limiting stenosis. Right carotid system: Common carotid artery widely patent to the bifurcation and ICA bulb. Somewhat irregular soft and calcified plaque affecting the ICA bulb with minimal diameter of 3.4 mm. Compared to a more distal cervical ICA diameter of 5 mm, this indicates a 30% stenosis. Vessels are tortuous, swinging to the midline behind the oropharynx. Left carotid system: Common carotid artery is patent to the bifurcation. Severe irregular calcified plaque at the carotid bifurcation and ICA bulb. Luminal stenosis likely 1 mm. Compared to a more distal cervical ICA diameter of 5 mm, this indicates an 80% stenosis. Vessels are tortuous, swinging to the midline behind the oropharynx. Vertebral arteries: Calcified plaque at both vertebral artery origin regions but without stenosis greater than 30%. Scattered areas of calcified plaque through the cervical course of both vertebral arteries  but  no stenosis greater than 30%. Skeleton: Degenerative cervical spondylosis. Other neck: No mass or lymphadenopathy. Upper chest: Emphysema and pulmonary scarring at the apices. Review of the MIP images confirms the above findings CTA HEAD FINDINGS Anterior circulation: Both internal carotid arteries are patent through the skull base and siphon regions. Ordinary siphon atherosclerotic calcification but without stenosis greater than 30%. Anterior and middle cerebral vessels are patent without large or medium vessel occlusion, aneurysm or vascular malformation. Posterior circulation: Both vertebral arteries are patent through the foramen magnum. There is atherosclerotic disease at both vertebral artery V4 segments with stenosis estimated at 50% on both sides. Both vessels supply the basilar. No basilar stenosis. 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 Venous sinuses: Patent and normal. Anatomic variants: None significant. Review of the MIP images confirms the above findings IMPRESSION: No acute large or medium vessel occlusion. Aortic Atherosclerosis (ICD10-I70.0) and Emphysema (ICD10-J43.9). Atherosclerotic disease at both carotid bifurcation and ICA bulb regions. 30% ICA stenosis on the right. 80% or greater irregular stenosis of the ICA bulb on the left. Vessels are tortuous, swinging to the midline behind the oropharynx. Atherosclerotic disease of both vertebral artery V4 segments with stenoses estimated at 50%. 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 Electronically Signed   By: Nelson Chimes M.D.   On: 12/02/2019 15:56   CT Head Wo Contrast  Result Date: 12/16/2019 CLINICAL DATA:  Near syncopal episode today. EXAM: CT HEAD WITHOUT CONTRAST TECHNIQUE: Contiguous axial images were obtained from the base of the skull through the vertex without intravenous contrast. COMPARISON:  Brain MRI  12/02/2019.  Head CT scan 12/01/2019. FINDINGS: Brain: No evidence of acute infarction, hemorrhage, hydrocephalus, extra-axial collection or mass lesion/mass effect. Extensive chronic microvascular ischemic change again seen. Lacunar infarctions in the basal ganglia and thalami also again seen. Vascular: No hyperdense vessel or unexpected calcification. Skull: Intact.  No focal lesion. Sinuses/Orbits: Negative. Other: None. IMPRESSION: No acute abnormality. Extensive chronic microvascular ischemic disease. Electronically Signed   By: Inge Rise M.D.   On: 12/16/2019 21:40   CT HEAD WO CONTRAST  Result Date: 12/01/2019 CLINICAL DATA:  Slurred speech x2 days. EXAM: CT HEAD WITHOUT CONTRAST TECHNIQUE: Contiguous axial images were obtained from the base of the skull through the vertex without intravenous contrast. COMPARISON:  Dec 19, 2017 FINDINGS: Brain: There is mild cerebral atrophy with widening of the extra-axial spaces and ventricular dilatation. There are areas of decreased attenuation within the white matter tracts of the supratentorial brain, consistent with microvascular disease changes. Small chronic bilateral basal ganglia lacunar infarcts are seen. 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 Skull: Normal. Negative for fracture or focal lesion. Sinuses/Orbits: No acute finding. Other: None. IMPRESSION: 1. Generalized cerebral atrophy. 2. No acute intracranial abnormality. 3. 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 4. Small chronic bilateral basal ganglia lacunar infarcts. Electronically Signed   By: Virgina Norfolk M.D.   On: 12/01/2019 20:16   CT ANGIO NECK W OR WO CONTRAST  Result Date: 12/02/2019 CLINICAL DATA:  Carotid artery stenosis. Recent slurred speech and mental status changes. Basilar tip aneurysm. Left carotid  stenosis by ultrasound. EXAM: CT ANGIOGRAPHY HEAD AND NECK TECHNIQUE: Multidetector CT imaging of the head and neck was performed  using the standard protocol during bolus administration of intravenous contrast. Multiplanar CT image reconstructions and MIPs were obtained to evaluate the vascular anatomy. Carotid stenosis measurements (when applicable) are obtained utilizing NASCET criteria, using the distal internal carotid diameter as the denominator. CONTRAST:  22mL OMNIPAQUE IOHEXOL 350 MG/ML SOLN COMPARISON:  MRI same day. Ultrasound same day. MRI 08/26/2013. CT angiography 08/19/2013. FINDINGS: CT HEAD FINDINGS Brain: Brain atrophy with chronic small-vessel ischemic changes affecting the pons, thalami, basal ganglia and hemispheric white matter. No sign of acute infarction, mass lesion, hemorrhage, hydrocephalus or extra-axial collection. Vascular: There is atherosclerotic calcification of the major vessels at the base of the brain. 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 See below. Skull: Negative Sinuses: Clear sinuses. Orbits: Lens calcification on the right. Review of the MIP images confirms the above findings CTA NECK FINDINGS Aortic arch: Aortic atherosclerotic calcification. No aneurysm or dissection. Branching pattern is normal without flow limiting stenosis. Right carotid system: Common carotid artery widely patent to the bifurcation and ICA bulb. Somewhat irregular soft and calcified plaque affecting the ICA bulb with minimal diameter of 3.4 mm. Compared to a more distal cervical ICA diameter of 5 mm, this indicates a 30% stenosis. Vessels are tortuous, swinging to the midline behind the oropharynx. Left carotid system: Common carotid artery is patent to the bifurcation. Severe irregular calcified plaque at the carotid bifurcation and ICA bulb. Luminal stenosis likely 1 mm. Compared to a more distal cervical ICA diameter of 5 mm, this indicates an 80% stenosis. Vessels are tortuous, swinging to the midline  behind the oropharynx. Vertebral arteries: Calcified plaque at both vertebral artery origin regions but without stenosis greater than 30%. Scattered areas of calcified plaque through the cervical course of both vertebral arteries but no stenosis greater than 30%. Skeleton: Degenerative cervical spondylosis. Other neck: No mass or lymphadenopathy. Upper chest: Emphysema and pulmonary scarring at the apices. Review of the MIP images confirms the above findings CTA HEAD FINDINGS Anterior circulation: Both internal carotid arteries are patent through the skull base and siphon regions. Ordinary siphon atherosclerotic calcification but without stenosis greater than 30%. Anterior and middle cerebral vessels are patent without large or medium vessel occlusion, aneurysm or vascular malformation. Posterior circulation: Both vertebral arteries are patent through the foramen magnum. There is atherosclerotic disease at both vertebral artery V4 segments with stenosis estimated at 50% on both sides. Both vessels supply the basilar. No basilar stenosis. 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 Venous sinuses: Patent and normal. Anatomic variants: None significant. Review of the MIP images confirms the above findings IMPRESSION: No acute large or medium vessel occlusion. Aortic Atherosclerosis (ICD10-I70.0) and Emphysema (ICD10-J43.9). Atherosclerotic disease at both carotid bifurcation and ICA bulb regions. 30% ICA stenosis on the right. 80% or greater irregular stenosis of the ICA bulb on the left. Vessels are tortuous, swinging to the midline behind the oropharynx. Atherosclerotic disease of both vertebral artery V4 segments with stenoses estimated at 50%. 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 Electronically Signed   By: Nelson Chimes M.D.   On: 12/02/2019 15:56   CT Angio Chest PE W and/or Wo  Contrast  Result Date: 12/16/2019 CLINICAL DATA:  Shortness of breath, near syncope EXAM: CT ANGIOGRAPHY CHEST WITH CONTRAST TECHNIQUE: Multidetector CT imaging of the chest was performed using the standard protocol during bolus administration of intravenous contrast. Multiplanar CT image  reconstructions and MIPs were obtained to evaluate the vascular anatomy. CONTRAST:  29mL OMNIPAQUE IOHEXOL 350 MG/ML SOLN COMPARISON:  06/30/2011, 12/05/2019 FINDINGS: Cardiovascular: This is a technically adequate evaluation of the pulmonary vasculature. No filling defects or pulmonary emboli. The heart is normal without pericardial effusion. There is extensive atherosclerosis of the thoracic aorta, with no aneurysm or dissection. Significant atherosclerosis throughout the coronary vasculature greatest in the LAD distribution. Mediastinum/Nodes: No enlarged mediastinal, hilar, or axillary lymph nodes. Thyroid gland, trachea, and esophagus demonstrate no significant findings. Small hiatal hernia is noted. Lungs/Pleura: Upper lobe predominant emphysema is seen, with scattered areas of air trapping. No airspace disease, effusion, or pneumothorax. Central airways are patent. Upper Abdomen: No acute abnormality. Musculoskeletal: No acute or destructive bony lesions. Reconstructed images demonstrate no additional findings. Review of the MIP images confirms the above findings. IMPRESSION: 1. No CT evidence of pulmonary embolism. 2. Small hiatal hernia. 3. Aortic Atherosclerosis (ICD10-I70.0) and Emphysema (ICD10-J43.9). Electronically Signed   By: Randa Ngo M.D.   On: 12/16/2019 21:40   MR BRAIN WO CONTRAST  Result Date: 12/02/2019 CLINICAL DATA:  Neuro deficit, acute, stroke suspected. Additional history provided: Acute onset slurred speech, headache, mild nausea without vomiting EXAM: MRI HEAD WITHOUT CONTRAST TECHNIQUE: Multiplanar, multiecho pulse sequences of the brain and surrounding structures were obtained without  intravenous contrast. COMPARISON:  Noncontrast head CT 12/01/2019, noncontrast head CT 12/19/2017, brain MRI 08/26/2013, CT angiogram head 08/19/2013 FINDINGS: Brain: Multiple sequences are significantly motion degraded. Most notably there is moderate motion degradation of the axial and coronal diffusion-weighted sequence, moderate motion degradation of the axial SWI sequence, moderate motion degradation of the axial T1 weighted sequence, moderate/severe motion degradation of the axial T2 FLAIR sequence and moderate/severe motion degradation of the coronal T2 weighted sequence. Moderate to advanced patchy and confluent T2/FLAIR hyperintensity within the cerebral white matter is nonspecific, but consistent with chronic small vessel ischemic disease. Findings have progressed as compared to prior MRI 08/26/2013. Redemonstrated chronic lacunar infarcts within the bilateral basal ganglia. Also again seen, there are chronic small-vessel ischemic changes within the thalami and pons. Chronic microhemorrhages within the right frontal lobe, left periatrial region and left cerebellum not definitively present on a prior MRI. Stable, mild generalized parenchymal atrophy. There is no acute infarct. No evidence of intracranial mass. No extra-axial fluid collection. No midline shift. Vascular: Expected proximal arterial flow voids. 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 Skull and upper cervical spine: No focal marrow lesion Sinuses/Orbits: Visualized orbits show no acute finding. Mild ethmoid and right maxillary sinus mucosal thickening. No significant mastoid effusion. IMPRESSION: 1. Motion degraded examination as described. 2. No evidence of acute intracranial abnormality, including acute infarction. 3. Moderate to advanced chronic small vessel ischemic disease has progressed as compared to prior MRI 08/26/2013. Redemonstrated chronic lacunar  infarcts within the bilateral basal ganglia. 4. Nonspecific chronic microhemorrhages within the right frontal lobe, left periatrial region and left cerebellum which were not definitively present on the prior MRI. 5. Stable, mild generalized parenchymal atrophy. 6. 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 Electronically Signed   By: Kellie Simmering DO   On: 12/02/2019 08:26   US Carotid Bilateral (at [facility] and [facility] only)  Result Date: 12/02/2019 CLINICAL DATA:  84 year old female with TIA EXAM: BILATERAL CAROTID DUPLEX ULTRASOUND TECHNIQUE: Pearline Cables scale imaging, color Doppler and duplex ultrasound were  performed of bilateral carotid and vertebral arteries in the neck. COMPARISON:  None. FINDINGS: Criteria: Quantification of carotid stenosis is based on velocity parameters that correlate the residual internal carotid diameter with NASCET-based stenosis levels, using the diameter of the distal internal carotid lumen as the denominator for stenosis measurement. The following velocity measurements were obtained: RIGHT ICA:  Systolic 99991111 cm/sec, Diastolic 21 cm/sec CCA:  69 cm/sec SYSTOLIC ICA/CCA RATIO:  2.0 ECA:  120 cm/sec LEFT ICA:  Systolic 0000000 cm/sec, Diastolic 21 cm/sec CCA:  75 cm/sec SYSTOLIC ICA/CCA RATIO:  3.8 ECA:  1 4 cm/sec Right Brachial SBP: Not acquired Left Brachial SBP: Not acquired RIGHT CAROTID ARTERY: No significant calcifications of the right common carotid artery. Intermediate waveform maintained. Moderate heterogeneous and partially calcified plaque at the right carotid bifurcation. No significant lumen shadowing. Low resistance waveform of the right ICA. Tortuosity RIGHT VERTEBRAL ARTERY: Antegrade flow with low resistance waveform. LEFT CAROTID ARTERY: No significant calcifications of the left common carotid artery. Intermediate waveform maintained. Moderate heterogeneous and partially calcified plaque at the left carotid  bifurcation. No significant lumen shadowing. Low resistance waveform of the left ICA. Tortuosity LEFT VERTEBRAL ARTERY:  Antegrade flow with low resistance waveform. IMPRESSION: Right: Color duplex indicates moderate heterogeneous and calcified plaque, with no hemodynamically significant stenosis by duplex criteria in the extracranial cerebrovascular circulation. Left: Heterogeneous and partially calcified plaque at the left carotid bifurcation, with discordant results regarding degree of stenosis by established duplex criteria. Peak velocity suggests 70% - 99% stenosis, with the ICA/ CCA ratio suggesting a lesser degree of stenosis. If establishing a more accurate degree of stenosis is required, cerebral angiogram should be considered, or as a second best test, CTA. Signed, Dulcy Fanny. Dellia Nims, RPVI [facility] Radiology Electronically Signed   By: Corrie Mckusick D.O.   On: 12/02/2019 11:33   PERIPHERAL VASCULAR CATHETERIZATION  Result Date: 12/04/2019 See op note  DG Chest Port 1 View  Result Date: 12/05/2019 CLINICAL DATA:  Shortness of breath. EXAM: PORTABLE CHEST 1 VIEW COMPARISON:  12/02/2019 FINDINGS: Heart size remains enlarged. Calcification of the thoracic aorta is similar to the prior study. No consolidation or sign of pleural effusion. Slight increase in density along the paraspinal region likely reflects hiatal hernia. Spinal degenerative changes. No acute or destructive bone process. IMPRESSION: 1. No active cardiopulmonary disease. 2. Stable cardiomegaly and aortic atherosclerosis. Electronically Signed   By: Zetta Bills M.D.   On: 12/05/2019 10:42   DG Chest Portable 1 View  Result Date: 12/02/2019 CLINICAL DATA:  Slurred speech x2 days. EXAM: PORTABLE CHEST 1 VIEW COMPARISON:  January 26, 2014 FINDINGS: Mild diffuse chronic appearing increased lung markings are seen. There is no evidence of acute infiltrate, pleural effusion or  pneumothorax. The cardiac silhouette is moderately enlarged. There is marked severity calcification of the aortic arch. Multilevel degenerative changes seen throughout the thoracic spine. IMPRESSION: Chronic-appearing increased lung markings without evidence of acute or active cardiopulmonary disease. Electronically Signed   By: Virgina Norfolk M.D.   On: 12/02/2019 00:33   ECHOCARDIOGRAM COMPLETE  Result Date: 12/03/2019    ECHOCARDIOGRAM REPORT   Patient Name:   IYA GILPATRICK Date of Exam: 12/02/2019 Medical Rec #:  [id number]     Height:       61.0 in Accession #:    [id number]    Weight:       169.0 lb Date of Birth:  [birth year]      BSA:  1.758 m Patient Age:    63 years      BP:           156/69 mmHg Patient Gender: F             HR:           70 bpm. Exam Location:  [facility] Procedure: 2D Echo, Cardiac Doppler and Color Doppler Indications:     TIA 435.9  History:         Patient has no prior history of Echocardiogram examinations.                  COPD; Risk Factors:Hypertension.  Sonographer:     Sherrie Sport RDCS (AE) Referring Phys:  [id number] Arvella Merles MANSY Diagnosing Phys: Yolonda Kida MD IMPRESSIONS  1. Left ventricular ejection fraction, by estimation, is 65 to 70%. The left ventricle has normal function. The left ventricle has no regional wall motion abnormalities. There is mild concentric left ventricular hypertrophy. Left ventricular diastolic parameters are consistent with Grade I diastolic dysfunction (impaired relaxation).  2. Right ventricular systolic function is moderately reduced. The right ventricular size is moderately enlarged. Mildly increased right ventricular wall thickness. There is normal pulmonary artery systolic pressure.  3. The mitral valve is normal in structure. No evidence of mitral valve regurgitation.  4. The aortic valve is grossly normal. Aortic valve regurgitation is not visualized. FINDINGS  Left Ventricle: Left ventricular ejection fraction, by estimation, is 65 to  70%. The left ventricle has normal function. The left ventricle has no regional wall motion abnormalities. The left ventricular internal cavity size was normal in size. There is  mild concentric left ventricular hypertrophy. Left ventricular diastolic parameters are consistent with Grade I diastolic dysfunction (impaired relaxation). Right Ventricle: The right ventricular size is moderately enlarged. Mildly increased right ventricular wall thickness. Right ventricular systolic function is moderately reduced. There is normal pulmonary artery systolic pressure. The tricuspid regurgitant velocity is 1.81 m/s, and with an assumed right atrial pressure of 10 mmHg, the estimated right ventricular systolic pressure is [id number] mmHg. Left Atrium: Left atrial size was normal in size. Right Atrium: Right atrial size was normal in size. Pericardium: Trivial pericardial effusion is present. Mitral Valve: The mitral valve is normal in structure. No evidence of mitral valve regurgitation. Tricuspid Valve: The tricuspid valve is normal in structure. Tricuspid valve regurgitation is trivial. Aortic Valve: The aortic valve is grossly normal. Aortic valve regurgitation is not visualized. Aortic valve mean gradient measures 3.0 mmHg. Aortic valve peak gradient measures 5.5 mmHg. Aortic valve area, by VTI measures 3.64 cm. Pulmonic Valve: The pulmonic valve was grossly normal. Pulmonic valve regurgitation is not visualized. Aorta: The aortic root is normal in size and structure. IAS/Shunts: No atrial level shunt detected by color flow Doppler.  LEFT VENTRICLE PLAX 2D LVIDd:         3.50 cm  Diastology LVIDs:         2.14 cm  LV e' lateral:   6.42 cm/s LV PW:         0.91 cm  LV E/e' lateral: 16.2 LV IVS:        1.21 cm  LV e' medial:    6.31 cm/s LVOT diam:     2.00 cm  LV E/e' medial:  16.5 LV SV:         82 LV SV Index:   47 LVOT Area:     3.14 cm  RIGHT VENTRICLE RV Basal diam:  2.26 cm LEFT ATRIUM             Index       RIGHT ATRIUM            Index LA diam:        3.40 cm 1.93 cm/m  RA Area:     11.70 cm LA Vol (A2C):   33.6 ml 19.11 ml/m RA Volume:   23.20 ml  13.19 ml/m LA Vol (A4C):   33.9 ml 19.28 ml/m LA Biplane Vol: 33.6 ml 19.11 ml/m  AORTIC VALVE                   PULMONIC VALVE AV Area (Vmax):    3.11 cm    PV Vmax:       0.70 m/s AV Area (Vmean):   3.72 cm    PV Peak grad:  2.0 mmHg AV Area (VTI):     3.64 cm AV Vmax:           117.00 cm/s AV Vmean:          79.400 cm/s AV VTI:            0.225 m AV Peak Grad:      5.5 mmHg AV Mean Grad:      3.0 mmHg LVOT Vmax:         116.00 cm/s LVOT Vmean:        93.900 cm/s LVOT VTI:          0.261 m LVOT/AV VTI ratio: 1.16  AORTA Ao Root diam: 3.10 cm MITRAL VALVE                TRICUSPID VALVE MV Area (PHT): 2.18 cm     TR Peak grad:   13.1 mmHg MV Decel Time: 348 msec     TR Vmax:        181.00 cm/s MV E velocity: 104.00 cm/s MV A velocity: 135.00 cm/s  SHUNTS MV E/A ratio:  0.77         Systemic VTI:  0.26 m                             Systemic Diam: 2.00 cm Dwayne D Callwood MD Electronically signed by Yolonda Kida MD Signature Date/Time: 12/03/2019/6:39:32 AM    Final       ASSESSMENT/PLAN   Moderate Acute exacerbation of COPD  -agree with DUoneb and solumedrol 40 BID  -will d/c Daliresp 537mcg daily - adding zithromax 500mg   po daily  -adding MetaNEB q4h  - adding Mucomyst 32ml 20% BID  - IS at bedside  PT/OT when possible     Chronic hypoxemic respiratory failure  - patient is on 2L/min [place] and O2 saturation is >95%  - she is on home settings at this time  - recommend PT/OT and early mobilization    Thank you for allowing me to participate in the care of this patient.   Patient/Family are satisfied with care plan and all questions have been answered.  This document was prepared using Dragon voice recognition software and may include unintentional dictation errors.     Ottie Glazier, M.D.  Division of [facility]

## 2019-12-17 NOTE — ED Notes (Signed)
Pt awake, resting quietly.  Waiting on admission.  Iv in place.  nsr on monitor.

## 2019-12-17 NOTE — Progress Notes (Signed)
PROGRESS NOTE    Toni White  [id number] DOB: 06/20/1936 DOA: 12/16/2019 PCP: Birdie Sons, MD      Brief Narrative:  Toni White is a 84 y.o. F with hx COPD on home O2 2L, FEV1 22%, recent stroke, and HTN who presented with few days progressive DOE and then pre-syncope.  Patient was getting ready to get in the car, and slumped to the ground weak, nearly passed out.  In the ER, ECG and troponins normal (there was an initial spurious elevated troponin, repeats both normal).  CTA chest showed no pneumonia or PE.        Assessment & Plan:  COPD exacerbation -Start steroids -Start scheduled bronchodilators -Consult Pulmonology, appreciate cares -Continue Daliresp  Pre-syncope Patient given fluids overnight.  Her orthostatics this mroning were still abnormal -Continue IV fluids  Hypertension -Continue amlodipine  Recent TIA -Continue aspirin, Plavix  Mood -Continue citalopram  Peripheral neuropathy -Continue gabapentin  Hypothyroidism -Continue levothyroxine       Disposition: Status is: Observation  The patient was just admitted this mroning, and we will start above treatments and reassess tomorrow.  Dispo: The patient is from: Home              Anticipated d/c is to: Possibly SNF              Anticipated d/c date is: likely tomorrow              Patient currently is not medically stable to d/c.             MDM: This is a no charge note.  For further details, please see H&P by my partner Dr. Sidney White from earlier today.  The below labs and imaging reports were reviewed and summarized above.    DVT prophylaxis: Lovenox Code Status: DNR Family Communication: Daughter at bedside    Consultants:   Pulmonology  Procedures:     Antimicrobials:      Culture data:              Subjective: Feeling tired, weak.  Wheezy, dyspneic.        Objective: Vitals:   12/17/19 1141 12/17/19 1200 12/17/19 1300 12/17/19  1500  BP: (!) 127/57 (!) 126/104 (!) 128/55 127/71  Pulse: 100 (!) 102 98 (!) 104  Resp: (!) 28 18 18  (!) 26  Temp:      TempSrc:      SpO2: 100% 98% 99% 99%  Weight:      Height:        Intake/Output Summary (Last 24 hours) at 12/17/2019 1708 Last data filed at 12/17/2019 1453 Gross per 24 hour  Intake 612 ml  Output 600 ml  Net 12 ml   Filed Weights   12/16/19 1454  Weight: 77.1 kg    Examination: The patient was seen and examined.      Data Reviewed: I have personally reviewed following labs and imaging studies:  CBC: Recent Labs  Lab 12/16/19 1504 12/17/19 0626 12/17/19 0922  WBC 15.3* 13.3* 14.7*  HGB 11.5* 13.1 11.9*  HCT 34.5* 39.7 36.6  MCV 90.6 91.3 92.4  PLT 362 345 [id number]   Basic Metabolic Panel: Recent Labs  Lab 12/16/19 1504 12/16/19 2311 12/17/19 0626  NA 138 140 141  K 3.9 3.8 3.8  CL 99 97* 100  CO2 30 31 29   GLUCOSE 118* 100* 88  BUN 10 10 11   CREATININE 0.71 0.58 0.53  CALCIUM 9.7 9.6 9.4  GFR: Estimated Creatinine Clearance: 51.8 mL/min (by C-G formula based on SCr of 0.53 mg/dL). Liver Function Tests: No results for input(s): AST, ALT, ALKPHOS, BILITOT, PROT, ALBUMIN in the last 168 hours. No results for input(s): LIPASE, AMYLASE in the last 168 hours. No results for input(s): AMMONIA in the last 168 hours. Coagulation Profile: Recent Labs  Lab 12/17/19 0737  INR 1.1   Cardiac Enzymes: No results for input(s): CKTOTAL, CKMB, CKMBINDEX, TROPONINI in the last 168 hours. BNP (last 3 results) No results for input(s): PROBNP in the last 8760 hours. HbA1C: No results for input(s): HGBA1C in the last 72 hours. CBG: Recent Labs  Lab 12/17/19 0611  GLUCAP 82   Lipid Profile: No results for input(s): CHOL, HDL, LDLCALC, TRIG, CHOLHDL, LDLDIRECT in the last 72 hours. Thyroid Function Tests: Recent Labs    12/17/19 0922  TSH 1.207   Anemia Panel: No results for input(s): VITAMINB12, FOLATE, FERRITIN, TIBC, IRON, RETICCTPCT  in the last 72 hours. Urine analysis:    Component Value Date/Time   COLORURINE YELLOW (A) 12/16/2019 1504   APPEARANCEUR HAZY (A) 12/16/2019 1504   APPEARANCEUR Hazy 01/26/2014 1040   LABSPEC 1.030 12/16/2019 1504   LABSPEC 1.017 01/26/2014 1040   PHURINE 6.0 12/16/2019 1504   GLUCOSEU NEGATIVE 12/16/2019 1504   GLUCOSEU Negative 01/26/2014 1040   HGBUR NEGATIVE 12/16/2019 1504   BILIRUBINUR NEGATIVE 12/16/2019 1504   BILIRUBINUR Negative 01/26/2014 1040   KETONESUR 20 (A) 12/16/2019 1504   PROTEINUR NEGATIVE 12/16/2019 1504   NITRITE NEGATIVE 12/16/2019 1504   LEUKOCYTESUR SMALL (A) 12/16/2019 1504   LEUKOCYTESUR Negative 01/26/2014 1040   Sepsis Labs: @LABRCNTIP (procalcitonin:4,lacticacidven:4)  ) Recent Results (from the past 240 hour(s))  SARS Coronavirus 2 by RT PCR (hospital order, performed in [place] hospital lab) Nasopharyngeal Nasopharyngeal Swab     Status: None   Collection Time: 12/16/19  9:14 PM   Specimen: Nasopharyngeal Swab  Result Value Ref Range Status   SARS Coronavirus 2 NEGATIVE NEGATIVE Final    Comment: (NOTE) SARS-CoV-2 target nucleic acids are NOT DETECTED. The SARS-CoV-2 RNA is generally detectable in upper and lower respiratory specimens during the acute phase of infection. The lowest concentration of SARS-CoV-2 viral copies this assay can detect is 250 copies / mL. A negative result does not preclude SARS-CoV-2 infection and should not be used as the sole basis for treatment or other patient management decisions.  A negative result may occur with improper specimen collection / handling, submission of specimen other than nasopharyngeal swab, presence of viral mutation(s) within the areas targeted by this assay, and inadequate number of viral copies (<250 copies / mL). A negative result must be combined with clinical observations, patient history, and epidemiological information. Fact Sheet for Patients:     StrictlyIdeas.no Fact Sheet for Healthcare Providers: BankingDealers.co.za This test is not yet approved or cleared  by the [place] FDA and has been authorized for detection and/or diagnosis of SARS-CoV-2 by FDA under an Emergency Use Authorization (EUA).  This EUA will remain in effect (meaning this test can be used) for the duration of the COVID-19 declaration under Section 564(b)(1) of the Act, 21 U.S.C. section 360bbb-3(b)(1), unless the authorization is terminated or revoked sooner. Performed at [facility], [address], [place], [place] [address]          Radiology Studies: CT Head Wo Contrast  Result Date: 12/16/2019 CLINICAL DATA:  Near syncopal episode today. EXAM: CT HEAD WITHOUT CONTRAST TECHNIQUE: Contiguous axial images were obtained  from the base of the skull through the vertex without intravenous contrast. COMPARISON:  Brain MRI 12/02/2019.  Head CT scan 12/01/2019. FINDINGS: Brain: No evidence of acute infarction, hemorrhage, hydrocephalus, extra-axial collection or mass lesion/mass effect. Extensive chronic microvascular ischemic change again seen. Lacunar infarctions in the basal ganglia and thalami also again seen. Vascular: No hyperdense vessel or unexpected calcification. Skull: Intact.  No focal lesion. Sinuses/Orbits: Negative. Other: None. IMPRESSION: No acute abnormality. Extensive chronic microvascular ischemic disease. Electronically Signed   By: Inge Rise M.D.   On: 12/16/2019 21:40   CT Angio Chest PE W and/or Wo Contrast  Result Date: 12/16/2019 CLINICAL DATA:  Shortness of breath, near syncope EXAM: CT ANGIOGRAPHY CHEST WITH CONTRAST TECHNIQUE: Multidetector CT imaging of the chest was performed using the standard protocol during bolus administration of intravenous contrast. Multiplanar CT image reconstructions and MIPs were obtained to evaluate the vascular anatomy. CONTRAST:  88mL  OMNIPAQUE IOHEXOL 350 MG/ML SOLN COMPARISON:  06/30/2011, 12/05/2019 FINDINGS: Cardiovascular: This is a technically adequate evaluation of the pulmonary vasculature. No filling defects or pulmonary emboli. The heart is normal without pericardial effusion. There is extensive atherosclerosis of the thoracic aorta, with no aneurysm or dissection. Significant atherosclerosis throughout the coronary vasculature greatest in the LAD distribution. Mediastinum/Nodes: No enlarged mediastinal, hilar, or axillary lymph nodes. Thyroid gland, trachea, and esophagus demonstrate no significant findings. Small hiatal hernia is noted. Lungs/Pleura: Upper lobe predominant emphysema is seen, with scattered areas of air trapping. No airspace disease, effusion, or pneumothorax. Central airways are patent. Upper Abdomen: No acute abnormality. Musculoskeletal: No acute or destructive bony lesions. Reconstructed images demonstrate no additional findings. Review of the MIP images confirms the above findings. IMPRESSION: 1. No CT evidence of pulmonary embolism. 2. Small hiatal hernia. 3. Aortic Atherosclerosis (ICD10-I70.0) and Emphysema (ICD10-J43.9). Electronically Signed   By: Randa Ngo M.D.   On: 12/16/2019 21:40        Scheduled Meds: . acetylcysteine  4 mL Nebulization BID  . amLODipine  5 mg Oral Daily  . aspirin  81 mg Oral Daily  . citalopram  20 mg Oral Daily  . clopidogrel  75 mg Oral Daily  . enoxaparin (LOVENOX) injection  40 mg Subcutaneous Q24H  . gabapentin  100 mg Oral BID  . ipratropium-albuterol  3 mL Nebulization Q6H  . levothyroxine  37.5 mcg Oral Q0600  . methylPREDNISolone sodium succinate      . methylPREDNISolone (SOLU-MEDROL) injection  40 mg Intravenous Q12H  . multivitamin-lutein  1 capsule Oral BID  . pravastatin  40 mg Oral QHS  . roflumilast  500 mcg Oral Daily  . sodium chloride flush  3 mL Intravenous Once  . sodium chloride flush  3 mL Intravenous Q12H   Continuous Infusions: .  sodium chloride 75 mL/hr at 12/17/19 1453     LOS: 0 days    Time spent: 25 minutes    Edwin Dada, MD [facility] 12/17/2019, 5:08 PM     Please page though [place] or Epic secure chat:  For password, contact charge nurse

## 2019-12-17 NOTE — ED Notes (Signed)
Lab called; need blue top redrawn

## 2019-12-17 NOTE — ED Notes (Signed)
DNR bracelet placed on right wrist  

## 2019-12-17 NOTE — ED Notes (Signed)
Daughter at bedside.

## 2019-12-17 NOTE — ED Notes (Signed)
Pt given meal tray. Per daughter, pt only ate "a couple of bites"

## 2019-12-17 NOTE — Telephone Encounter (Signed)
Appointment scheduled for Friday 12/19/2019 at 10am. I called and advised patient's daughter [name] of the appointment time. She advised me that patient was taken to the ER at [facility] last night due to a syncopal episode. They plan to admit her. [name] wanted to keep the appointment for Friday at [facility]. She will call back to cancel appointment if her mom is still in the hospital on Friday.

## 2019-12-17 NOTE — Progress Notes (Signed)
PHARMACIST - PHYSICIAN COMMUNICATION  CONCERNING: P&T Medication Policy Regarding Oral Bisphosphonates  RECOMMENDATION: Your order for alendronate (Fosamax), ibandronate (Boniva), or risedronate (Actonel) has been discontinued at this time.  If the patient's post-hospital medical condition warrants safe use of this class of drugs, please resume the pre-hospital regimen upon discharge.  DESCRIPTION:  Alendronate (Fosamax), ibandronate (Boniva), and risedronate (Actonel) can cause severe esophageal erosions in patients who are unable to remain upright at least 30 minutes after taking this medication.   Since brief interruptions in therapy are thought to have minimal impact on bone mineral density, the [facility] has established that bisphosphonate orders should be routinely discontinued during hospitalization.   To override this safety policy and permit administration of Boniva, Fosamax, or Actonel in the hospital, prescribers must write "DO NOT HOLD" in the comments section when placing the order for this class of medications.   Paulina Fusi, PharmD, BCPS 12/17/2019 8:18 AM

## 2019-12-17 NOTE — ED Notes (Signed)
Pt IV site infiltrated with fluids upon arrival to shift. IV d/c. Educated about swelling. New IV started.

## 2019-12-18 DIAGNOSIS — Z90710 Acquired absence of both cervix and uterus: Secondary | ICD-10-CM | POA: Diagnosis not present

## 2019-12-18 DIAGNOSIS — G9341 Metabolic encephalopathy: Secondary | ICD-10-CM | POA: Diagnosis present

## 2019-12-18 DIAGNOSIS — E039 Hypothyroidism, unspecified: Secondary | ICD-10-CM | POA: Diagnosis present

## 2019-12-18 DIAGNOSIS — R55 Syncope and collapse: Secondary | ICD-10-CM | POA: Diagnosis present

## 2019-12-18 DIAGNOSIS — Z20822 Contact with and (suspected) exposure to covid-19: Secondary | ICD-10-CM | POA: Diagnosis present

## 2019-12-18 DIAGNOSIS — E876 Hypokalemia: Secondary | ICD-10-CM | POA: Diagnosis present

## 2019-12-18 DIAGNOSIS — E860 Dehydration: Secondary | ICD-10-CM | POA: Diagnosis present

## 2019-12-18 DIAGNOSIS — J432 Centrilobular emphysema: Secondary | ICD-10-CM | POA: Diagnosis present

## 2019-12-18 DIAGNOSIS — J449 Chronic obstructive pulmonary disease, unspecified: Secondary | ICD-10-CM | POA: Diagnosis not present

## 2019-12-18 DIAGNOSIS — J9621 Acute and chronic respiratory failure with hypoxia: Secondary | ICD-10-CM | POA: Diagnosis present

## 2019-12-18 DIAGNOSIS — Z66 Do not resuscitate: Secondary | ICD-10-CM | POA: Diagnosis present

## 2019-12-18 DIAGNOSIS — Z87891 Personal history of nicotine dependence: Secondary | ICD-10-CM | POA: Diagnosis not present

## 2019-12-18 DIAGNOSIS — G629 Polyneuropathy, unspecified: Secondary | ICD-10-CM | POA: Diagnosis present

## 2019-12-18 DIAGNOSIS — Z8673 Personal history of transient ischemic attack (TIA), and cerebral infarction without residual deficits: Secondary | ICD-10-CM | POA: Diagnosis not present

## 2019-12-18 DIAGNOSIS — R531 Weakness: Secondary | ICD-10-CM | POA: Diagnosis not present

## 2019-12-18 DIAGNOSIS — R627 Adult failure to thrive: Secondary | ICD-10-CM | POA: Diagnosis present

## 2019-12-18 DIAGNOSIS — J810 Acute pulmonary edema: Secondary | ICD-10-CM | POA: Diagnosis present

## 2019-12-18 DIAGNOSIS — Z9981 Dependence on supplemental oxygen: Secondary | ICD-10-CM | POA: Diagnosis not present

## 2019-12-18 DIAGNOSIS — R54 Age-related physical debility: Secondary | ICD-10-CM | POA: Diagnosis present

## 2019-12-18 DIAGNOSIS — R000 Tachycardia, unspecified: Secondary | ICD-10-CM | POA: Diagnosis not present

## 2019-12-18 DIAGNOSIS — I69322 Dysarthria following cerebral infarction: Secondary | ICD-10-CM | POA: Diagnosis not present

## 2019-12-18 DIAGNOSIS — J189 Pneumonia, unspecified organism: Secondary | ICD-10-CM | POA: Diagnosis present

## 2019-12-18 DIAGNOSIS — Z8249 Family history of ischemic heart disease and other diseases of the circulatory system: Secondary | ICD-10-CM | POA: Diagnosis not present

## 2019-12-18 DIAGNOSIS — Z8619 Personal history of other infectious and parasitic diseases: Secondary | ICD-10-CM | POA: Diagnosis not present

## 2019-12-18 DIAGNOSIS — I10 Essential (primary) hypertension: Secondary | ICD-10-CM | POA: Diagnosis present

## 2019-12-18 DIAGNOSIS — J9811 Atelectasis: Secondary | ICD-10-CM | POA: Diagnosis present

## 2019-12-18 DIAGNOSIS — J441 Chronic obstructive pulmonary disease with (acute) exacerbation: Secondary | ICD-10-CM | POA: Diagnosis not present

## 2019-12-18 DIAGNOSIS — E785 Hyperlipidemia, unspecified: Secondary | ICD-10-CM | POA: Diagnosis present

## 2019-12-18 DIAGNOSIS — E870 Hyperosmolality and hypernatremia: Secondary | ICD-10-CM | POA: Diagnosis present

## 2019-12-18 DIAGNOSIS — I69328 Other speech and language deficits following cerebral infarction: Secondary | ICD-10-CM | POA: Diagnosis not present

## 2019-12-18 DIAGNOSIS — J9610 Chronic respiratory failure, unspecified whether with hypoxia or hypercapnia: Secondary | ICD-10-CM | POA: Diagnosis not present

## 2019-12-18 LAB — GLUCOSE, CAPILLARY: Glucose-Capillary: 140 mg/dL — ABNORMAL HIGH (ref 70–99)

## 2019-12-18 MED ORDER — PREDNISONE 50 MG PO TABS
50.0000 mg | ORAL_TABLET | Freq: Every day | ORAL | Status: DC
  Administered 2019-12-19 – 2019-12-20 (×2): 50 mg via ORAL
  Filled 2019-12-18 (×2): qty 1

## 2019-12-18 MED ORDER — FUROSEMIDE 10 MG/ML IJ SOLN
40.0000 mg | Freq: Once | INTRAMUSCULAR | Status: AC
  Administered 2019-12-18: 13:00:00 40 mg via INTRAVENOUS
  Filled 2019-12-18: qty 4

## 2019-12-18 NOTE — Care Management Important Message (Deleted)
Important Message  Patient Details  Name: ADDIELYNN KLEINBERG MRN: [id number] Date of Birth: 10-Jul-1936   Medicare Important Message Given:  Yes     Anselm Pancoast, RN 12/18/2019, 12:14 PM

## 2019-12-18 NOTE — Progress Notes (Addendum)
PROGRESS NOTE    Toni White  [id number] DOB: 28-Mar-1936 DOA: 12/16/2019 PCP: Birdie Sons, MD      Brief Narrative:  Toni White is a 84 y.o. F with hx COPD on home O2 2L, FEV1 22%, recent stroke, and HTN who presented with few days progressive DOE and then pre-syncope.  Patient was getting ready to get in the car, and slumped to the ground weak, nearly passed out.  In the ER, ECG and troponins normal (there was an initial spurious elevated troponin, repeats both normal).  CTA chest showed no pneumonia or PE.        Assessment & Plan:  COPD exacerbation Patient had developed increased respiratory symptoms recently, also worse hypoxia with ambulation than baseline.  Had been treated with oral prednisone as outpt but failed and symptoms worsened.  Today, able to ambulate with nursing but HR up to 150s. -Continue Solu-Medrol -Continue scheduled bronchodilators -Continue azithromycin -Continue Meta-neb, mucomyst -Consult Pulmonology, appreciate expert advice -Continue Daliresp  ADDENDUM: Daliresp Solumedrol d/c'd by Pulmonology.   Pre-syncope Patient given fluids overnight.  Her orthostatics this mroning were still abnormal. Repeat orthostatics today showed no drop with standing. -Stop IV fluids  Hypertension BP elevated slightly -Continue amlodipine  Recent TIA -Continue aspirin, Plavix  Mood -Continue citalopram  Peripheral neuropathy -Continue gabapentin  Hypothyroidism -Continue levothyroxine       Disposition: Status is: Inpatient  At this time, continued inpatient services are reasonable and expected, given the patient's: Severity of presentation: age 43, co-morbid severe COPD with FEV1 22%, ongoing IV steroids, failed outpatient treatment with prednisone prior to admission, tachycardia to 150s with walking just a few feet and the high likelihood of an adverse outcome, including readmission, debility or death if the patient were to be  discharged prematurely.    Dispo: The patient is from: Home              Anticipated d/c is to: TBD              Anticipated d/c date is: 1-2 maybe three days              Patient currently                     MDM: The below labs and imaging reports reviewed and summarized above.  Medication management as above. This is a severe exacerbation of severe COPD    DVT prophylaxis: Lovenox Code Status: DNR Family Communication: Daughter at bedside    Consultants:   Pulmonology  Procedures:     Antimicrobials:      Culture data:              Subjective: The patient has a tremor, she is very weak.  However she denies dyspnea or chest discomfort or wheezing.  Nursing report no respiratory distress.  Orthostatics checked and were normal.  Patient able to ambulate short distance with therapy and her oxygen saturation stayed well, however in the hospital, her heart rate increased dramatically.        Objective: Vitals:   12/18/19 0616 12/18/19 0800 12/18/19 0919 12/18/19 1200  BP:  (!) 168/76 118/79 (!) 172/52  Pulse:  (!) 105 (!) 111 (!) 133  Resp: 20 17 17 19   Temp: 97.8 F (36.6 C) 98.2 F (36.8 C) 98.5 F (36.9 C) 98.2 F (36.8 C)  TempSrc: Oral Axillary Oral   SpO2:  100% 96% 97%  Weight:      Height:  Intake/Output Summary (Last 24 hours) at 12/18/2019 1228 Last data filed at 12/18/2019 1021 Gross per 24 hour  Intake 2200.06 ml  Output 450 ml  Net 1750.06 ml   Filed Weights   12/16/19 1454  Weight: 77.1 kg    Examination: General appearance: Frail elderly female, lying in bed, no obvious distress, appears tired and weak.     HEENT: Anicteric, right eye has some mild injection in the conjunctival, left eye normal, lids and lashes normal.  No nasal deformity, discharge, or epistaxis.  Lips normal, oropharynx moist, no oral lesions, hearing diminished Skin: Scattered senile purpura, no other suspicious rashes or  lesions. Cardiac: At rest, regular rate and rhythm, no murmurs appreciated, JVP not visible, no lower extremity edema. Respiratory: Respiratory effort increased at rest, lung sounds diminished bilaterally, scant expiratory wheezing, improved from yesterday, but still appears dyspneic. Abdomen: No tenderness palpation or guarding, no hepatosplenomegaly MSK: Diminished muscle mass diffusely, diminished subcutaneous fat. Neuro: Awake and makes eye contact.  Generally weak, but strength symmetric in upper extremities.  Speech fluent. Psych: Attention diminished, affect blunted, judgment and insight appear moderately impaired by dementia.      Data Reviewed: I have personally reviewed following labs and imaging studies:  CBC: Recent Labs  Lab 12/16/19 1504 12/17/19 0626 12/17/19 0922  WBC 15.3* 13.3* 14.7*  HGB 11.5* 13.1 11.9*  HCT 34.5* 39.7 36.6  MCV 90.6 91.3 92.4  PLT 362 345 [id number]   Basic Metabolic Panel: Recent Labs  Lab 12/16/19 1504 12/16/19 2311 12/17/19 0626  NA 138 140 141  K 3.9 3.8 3.8  CL 99 97* 100  CO2 30 31 29   GLUCOSE 118* 100* 88  BUN 10 10 11   CREATININE 0.71 0.58 0.53  CALCIUM 9.7 9.6 9.4   GFR: Estimated Creatinine Clearance: 51.8 mL/min (by C-G formula based on SCr of 0.53 mg/dL). Liver Function Tests: No results for input(s): AST, ALT, ALKPHOS, BILITOT, PROT, ALBUMIN in the last 168 hours. No results for input(s): LIPASE, AMYLASE in the last 168 hours. No results for input(s): AMMONIA in the last 168 hours. Coagulation Profile: Recent Labs  Lab 12/17/19 0737  INR 1.1   Cardiac Enzymes: No results for input(s): CKTOTAL, CKMB, CKMBINDEX, TROPONINI in the last 168 hours. BNP (last 3 results) No results for input(s): PROBNP in the last 8760 hours. HbA1C: No results for input(s): HGBA1C in the last 72 hours. CBG: Recent Labs  Lab 12/17/19 0611 12/18/19 0457  GLUCAP 82 140*   Lipid Profile: No results for input(s): CHOL, HDL, LDLCALC,  TRIG, CHOLHDL, LDLDIRECT in the last 72 hours. Thyroid Function Tests: Recent Labs    12/17/19 0922  TSH 1.207   Anemia Panel: No results for input(s): VITAMINB12, FOLATE, FERRITIN, TIBC, IRON, RETICCTPCT in the last 72 hours. Urine analysis:    Component Value Date/Time   COLORURINE YELLOW (A) 12/16/2019 1504   APPEARANCEUR HAZY (A) 12/16/2019 1504   APPEARANCEUR Hazy 01/26/2014 1040   LABSPEC 1.030 12/16/2019 1504   LABSPEC 1.017 01/26/2014 1040   PHURINE 6.0 12/16/2019 1504   GLUCOSEU NEGATIVE 12/16/2019 1504   GLUCOSEU Negative 01/26/2014 1040   HGBUR NEGATIVE 12/16/2019 1504   BILIRUBINUR NEGATIVE 12/16/2019 1504   BILIRUBINUR Negative 01/26/2014 1040   KETONESUR 20 (A) 12/16/2019 1504   PROTEINUR NEGATIVE 12/16/2019 1504   NITRITE NEGATIVE 12/16/2019 1504   LEUKOCYTESUR SMALL (A) 12/16/2019 1504   LEUKOCYTESUR Negative 01/26/2014 1040   Sepsis Labs: @LABRCNTIP (procalcitonin:4,lacticacidven:4)  ) Recent Results (from the past 240 hour(s))  SARS Coronavirus 2 by RT PCR (hospital order, performed in [facility] hospital lab) Nasopharyngeal Nasopharyngeal Swab     Status: None   Collection Time: 12/16/19  9:14 PM   Specimen: Nasopharyngeal Swab  Result Value Ref Range Status   SARS Coronavirus 2 NEGATIVE NEGATIVE Final    Comment: (NOTE) SARS-CoV-2 target nucleic acids are NOT DETECTED. The SARS-CoV-2 RNA is generally detectable in upper and lower respiratory specimens during the acute phase of infection. The lowest concentration of SARS-CoV-2 viral copies this assay can detect is 250 copies / mL. A negative result does not preclude SARS-CoV-2 infection and should not be used as the sole basis for treatment or other patient management decisions.  A negative result may occur with improper specimen collection / handling, submission of specimen other than nasopharyngeal swab, presence of viral mutation(s) within the areas targeted by this assay, and inadequate number  of viral copies (<250 copies / mL). A negative result must be combined with clinical observations, patient history, and epidemiological information. Fact Sheet for Patients:   StrictlyIdeas.no Fact Sheet for Healthcare Providers: BankingDealers.co.za This test is not yet approved or cleared  by the [place] FDA and has been authorized for detection and/or diagnosis of SARS-CoV-2 by FDA under an Emergency Use Authorization (EUA).  This EUA will remain in effect (meaning this test can be used) for the duration of the COVID-19 declaration under Section 564(b)(1) of the Act, 21 U.S.C. section 360bbb-3(b)(1), unless the authorization is terminated or revoked sooner. Performed at [facility], [address], [place], [place] [address]          Radiology Studies: CT Head Wo Contrast  Result Date: 12/16/2019 CLINICAL DATA:  Near syncopal episode today. EXAM: CT HEAD WITHOUT CONTRAST TECHNIQUE: Contiguous axial images were obtained from the base of the skull through the vertex without intravenous contrast. COMPARISON:  Brain MRI 12/02/2019.  Head CT scan 12/01/2019. FINDINGS: Brain: No evidence of acute infarction, hemorrhage, hydrocephalus, extra-axial collection or mass lesion/mass effect. Extensive chronic microvascular ischemic change again seen. Lacunar infarctions in the basal ganglia and thalami also again seen. Vascular: No hyperdense vessel or unexpected calcification. Skull: Intact.  No focal lesion. Sinuses/Orbits: Negative. Other: None. IMPRESSION: No acute abnormality. Extensive chronic microvascular ischemic disease. Electronically Signed   By: Inge Rise M.D.   On: 12/16/2019 21:40   CT Angio Chest PE W and/or Wo Contrast  Result Date: 12/16/2019 CLINICAL DATA:  Shortness of breath, near syncope EXAM: CT ANGIOGRAPHY CHEST WITH CONTRAST TECHNIQUE: Multidetector CT imaging of the chest was performed using the  standard protocol during bolus administration of intravenous contrast. Multiplanar CT image reconstructions and MIPs were obtained to evaluate the vascular anatomy. CONTRAST:  40mL OMNIPAQUE IOHEXOL 350 MG/ML SOLN COMPARISON:  06/30/2011, 12/05/2019 FINDINGS: Cardiovascular: This is a technically adequate evaluation of the pulmonary vasculature. No filling defects or pulmonary emboli. The heart is normal without pericardial effusion. There is extensive atherosclerosis of the thoracic aorta, with no aneurysm or dissection. Significant atherosclerosis throughout the coronary vasculature greatest in the LAD distribution. Mediastinum/Nodes: No enlarged mediastinal, hilar, or axillary lymph nodes. Thyroid gland, trachea, and esophagus demonstrate no significant findings. Small hiatal hernia is noted. Lungs/Pleura: Upper lobe predominant emphysema is seen, with scattered areas of air trapping. No airspace disease, effusion, or pneumothorax. Central airways are patent. Upper Abdomen: No acute abnormality. Musculoskeletal: No acute or destructive bony lesions. Reconstructed images demonstrate no additional findings. Review of the MIP images confirms the above findings. IMPRESSION: 1. No  CT evidence of pulmonary embolism. 2. Small hiatal hernia. 3. Aortic Atherosclerosis (ICD10-I70.0) and Emphysema (ICD10-J43.9). Electronically Signed   By: Randa Ngo M.D.   On: 12/16/2019 21:40        Scheduled Meds: . amLODipine  5 mg Oral Daily  . aspirin  81 mg Oral Daily  . azithromycin  500 mg Oral Daily  . citalopram  20 mg Oral Daily  . clopidogrel  75 mg Oral Daily  . enoxaparin (LOVENOX) injection  40 mg Subcutaneous Q24H  . furosemide  40 mg Intravenous Once  . gabapentin  100 mg Oral BID  . ipratropium-albuterol  3 mL Nebulization Q6H  . levothyroxine  37.5 mcg Oral Q0600  . multivitamin-lutein  1 capsule Oral BID  . pravastatin  40 mg Oral QHS  . [START ON 12/19/2019] predniSONE  50 mg Oral Q breakfast  .  sodium chloride flush  3 mL Intravenous Once  . sodium chloride flush  3 mL Intravenous Q12H   Continuous Infusions:    LOS: 0 days    Time spent: 35 minutes    Edwin Dada, MD [facility] 12/18/2019, 12:28 PM     Please page though [place] or Epic secure chat:  For password, contact charge nurse

## 2019-12-18 NOTE — Progress Notes (Signed)
This note also relates to the following rows which could not be included: BP - Cannot attach notes to unvalidated device data Pulse Rate - Cannot attach notes to unvalidated device data ECG Heart Rate - Cannot attach notes to unvalidated device data Resp - Cannot attach notes to unvalidated device data SpO2 - Cannot attach notes to unvalidated device data    12/18/19 2200  Assess: if the MEWS score is Yellow or Red  Were vital signs taken at a resting state? Yes  Focused Assessment Documented focused assessment  Treat  MEWS Interventions Administered scheduled meds/treatments  Document  Patient Outcome Stabilized after interventions  Progress note created (see row info) Yes

## 2019-12-18 NOTE — Progress Notes (Signed)
Pulmonary Medicine          Date: 12/18/2019,   MRN# [id number] Toni White September 18, 1938     AdmissionWeight: 77.1 kg                 CurrentWeight: 77.1 kg  Referring physician: Dr Loleta Books    CHIEF COMPLAINT:   Worsening shortness of breath at rest.    SUBJECTIVE   Patient laying in bed, she feels about same as yesterday.   She was able to get out of bed with PT. She had multiple treatments of MetaNEB.  Patient is on home O2 setting. Lung auscultaion with less rhonchorous breathing today.   PAST MEDICAL HISTORY   Past Medical History:  Diagnosis Date  . COPD (chronic obstructive pulmonary disease) ([facility])   . DNR (do not resuscitate) 11/2019   DNR/DNI, confirmed in person by patient and her daughter/POA  . History of chicken pox   . Hyperlipidemia   . Hypertension   . Ulcer      SURGICAL HISTORY   Past Surgical History:  Procedure Laterality Date  . ABDOMINAL HYSTERECTOMY  2003   with BSO due to bleeding  . APPENDECTOMY    . BREAST BIOPSY Left 2005   stereo breast biopsy benign  . BREAST BIOPSY Right 2005   core bx. benign  . BREAST BIOPSY Left 2000?   benign  . BREAST CYST EXCISION Left 1990   incision and draniage of abscess  . CAROTID PTA/STENT INTERVENTION Left 12/04/2019   Procedure: CAROTID PTA/STENT INTERVENTION;  Surgeon: Katha Cabal, MD;  Location: [facility];  Service: Cardiovascular;  Laterality: Left;  . CHOLECYSTECTOMY    . COLONOSCOPY  2003   Dr. Vira Agar. Hyperplastic polyps; Single polyp excision from rectum  . CT Scan of Brain  08/20/2013   Mild diffuse cortical atrophy. Mid chronic ischemic white matter disease. 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 [place]  . INCISION AND DRAINAGE BREAST ABSCESS Left 1990  . TONSILLECTOMY  1952  . UPPER GI ENDOSCOPY  02/26/2002   Dr. Tiffany Kocher; Gastritis. Single gastric ectasia     FAMILY HISTORY   Family History  Problem Relation  Age of Onset  . Heart disease Father   . Congestive Heart Failure Father   . Heart disease Mother   . Congestive Heart Failure Mother   . Cancer Brother        lung  . Lung cancer Brother   . Diabetes Daughter   . Diabetes Daughter   . Breast cancer Neg Hx      SOCIAL HISTORY   Social History   Tobacco Use  . Smoking status: Former Smoker    Packs/day: 1.00    Years: 30.00    Pack years: 30.00    Types: Cigarettes  . Smokeless tobacco: Never Used  . Tobacco comment: quit around 1989  Substance Use Topics  . Alcohol use: No  . Drug use: No     MEDICATIONS    Home Medication:    Current Medication:  Current Facility-Administered Medications:  .  acetaminophen (TYLENOL) tablet 650 mg, 650 mg, Oral, Q6H PRN **OR** acetaminophen (TYLENOL) suppository 650 mg, 650 mg, Rectal, Q6H PRN, Mansy, Jan A, MD .  acetylcysteine (MUCOMYST) 20 % nebulizer / oral solution 4 mL, 4 mL, Nebulization, BID, Lanney Gins, Jerie Basford, MD, 4 mL at 12/18/19 0755 .  alum & mag hydroxide-simeth (MAALOX/MYLANTA) 200-200-20 MG/5ML suspension 30 mL,  30 mL, Oral, Q6H PRN, Mansy, Jan A, MD .  amLODipine (NORVASC) tablet 5 mg, 5 mg, Oral, Daily, Mansy, Jan A, MD, 5 mg at 12/17/19 0937 .  aspirin chewable tablet 81 mg, 81 mg, Oral, Daily, Mansy, Jan A, MD, 81 mg at 12/17/19 0937 .  azithromycin (ZITHROMAX) tablet 500 mg, 500 mg, Oral, Daily, Lanney Gins, Jakalyn Kratky, MD, 500 mg at 12/17/19 1737 .  citalopram (CELEXA) tablet 20 mg, 20 mg, Oral, Daily, Mansy, Jan A, MD, 20 mg at 12/17/19 0937 .  clopidogrel (PLAVIX) tablet 75 mg, 75 mg, Oral, Daily, Mansy, Jan A, MD, 75 mg at 12/17/19 0937 .  enoxaparin (LOVENOX) injection 40 mg, 40 mg, Subcutaneous, Q24H, Mansy, Jan A, MD, 40 mg at 12/17/19 0937 .  gabapentin (NEURONTIN) capsule 100 mg, 100 mg, Oral, BID, Mansy, Jan A, MD, 100 mg at 12/17/19 2213 .  ipratropium-albuterol (DUONEB) 0.5-2.5 (3) MG/3ML nebulizer solution 3 mL, 3 mL, Nebulization, Q6H, Danford, Suann Larry,  MD, 3 mL at 12/18/19 0755 .  levothyroxine (SYNTHROID) tablet 37.5 mcg, 37.5 mcg, Oral, Q0600, Mansy, Jan A, MD, 37.5 mcg at 12/18/19 0605 .  magnesium hydroxide (MILK OF MAGNESIA) suspension 30 mL, 30 mL, Oral, Daily PRN, Mansy, Jan A, MD .  methylPREDNISolone sodium succinate (SOLU-MEDROL) 40 mg/mL injection 40 mg, 40 mg, Intravenous, Q12H, Danford, Suann Larry, MD, 40 mg at 12/17/19 2213 .  multivitamin-lutein (OCUVITE-LUTEIN) capsule 1 capsule, 1 capsule, Oral, BID, Mansy, Jan A, MD, 1 capsule at 12/17/19 0937 .  ondansetron (ZOFRAN) tablet 4 mg, 4 mg, Oral, Q6H PRN **OR** ondansetron (ZOFRAN) injection 4 mg, 4 mg, Intravenous, Q6H PRN, Mansy, Jan A, MD .  pravastatin (PRAVACHOL) tablet 40 mg, 40 mg, Oral, QHS, Mansy, Jan A, MD, 40 mg at 12/17/19 2213 .  sodium chloride flush (NS) 0.9 % injection 3 mL, 3 mL, Intravenous, Once, Duffy Bruce, MD .  sodium chloride flush (NS) 0.9 % injection 3 mL, 3 mL, Intravenous, Q12H, Mansy, Jan A, MD .  traMADol Veatrice Bourbon) tablet 50 mg, 50 mg, Oral, Q6H PRN, Mansy, Arvella Merles, MD    ALLERGIES   Flexeril  [cyclobenzaprine hcl], Reglan [metoclopramide], Relafen [nabumetone], Risperdal  [risperidone], Sulfa antibiotics, Tape, Triamterene-hctz, and Hctz [hydrochlorothiazide]     REVIEW OF SYSTEMS    Review of Systems:  Gen:  Denies  fever, sweats, chills weigh loss  HEENT: Denies blurred vision, double vision, ear pain, eye pain, hearing loss, nose bleeds, sore throat Cardiac:  No dizziness, chest pain or heaviness, chest tightness,edema Resp:   Denies cough or sputum porduction, shortness of breath,wheezing, hemoptysis,  Gi: Denies swallowing difficulty, stomach pain, nausea or vomiting, diarrhea, constipation, bowel incontinence Gu:  Denies bladder incontinence, burning urine Ext:   Denies Joint pain, stiffness or swelling Skin: Denies  skin rash, easy bruising or bleeding or hives Endoc:  Denies polyuria, polydipsia , polyphagia or weight  change Psych:   Denies depression, insomnia or hallucinations   Other:  All other systems negative   VS: BP 118/79 (BP Location: Right Arm)   Pulse (!) 111   Temp 98.5 F (36.9 C) (Oral)   Resp 17   Ht 5\' 1"  (1.549 m)   Wt 77.1 kg   SpO2 96%   BMI 32.12 kg/m      PHYSICAL EXAM    GENERAL:NAD, no fevers, chills, no weakness no fatigue HEAD: Normocephalic, atraumatic.  EYES: Pupils equal, round, reactive to light. Extraocular muscles intact. No scleral icterus.  MOUTH: Moist mucosal membrane. Dentition intact. No  abscess noted.  EAR, NOSE, THROAT: Clear without exudates. No external lesions.  NECK: Supple. No thyromegaly. No nodules. No JVD.  PULMONARY: Mildly ronchorous breath sounds.  CARDIOVASCULAR: S1 and S2. Regular rate and rhythm. No murmurs, rubs, or gallops. No edema. Pedal pulses 2+ bilaterally.  GASTROINTESTINAL: Soft, nontender, nondistended. No masses. Positive bowel sounds. No hepatosplenomegaly.  MUSCULOSKELETAL: No swelling, clubbing, or edema. Range of motion full in all extremities.  NEUROLOGIC: Cranial nerves II through XII are intact. No gross focal neurological deficits. Sensation intact. Reflexes intact.  SKIN: No ulceration, lesions, rashes, or cyanosis. Skin warm and dry. Turgor intact.  PSYCHIATRIC: Mood, affect within normal limits. The patient is awake, alert and oriented x 3. Insight, judgment intact.       IMAGING    CT ANGIO HEAD W OR WO CONTRAST  Result Date: 12/02/2019 CLINICAL DATA:  Carotid artery stenosis. Recent slurred speech and mental status changes. Basilar tip aneurysm. Left carotid stenosis by ultrasound. EXAM: CT ANGIOGRAPHY HEAD AND NECK TECHNIQUE: Multidetector CT imaging of the head and neck was performed using the standard protocol during bolus administration of intravenous contrast. Multiplanar CT image reconstructions and MIPs were obtained to evaluate the vascular anatomy. Carotid stenosis measurements (when applicable) are  obtained utilizing NASCET criteria, using the distal internal carotid diameter as the denominator. CONTRAST:  9mL OMNIPAQUE IOHEXOL 350 MG/ML SOLN COMPARISON:  MRI same day. Ultrasound same day. MRI 08/26/2013. CT angiography 08/19/2013. FINDINGS: CT HEAD FINDINGS Brain: Brain atrophy with chronic small-vessel ischemic changes affecting the pons, thalami, basal ganglia and hemispheric white matter. No sign of acute infarction, mass lesion, hemorrhage, hydrocephalus or extra-axial collection. Vascular: There is atherosclerotic calcification of the major vessels at the base of the brain. 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 See below. Skull: Negative Sinuses: Clear sinuses. Orbits: Lens calcification on the right. Review of the MIP images confirms the above findings CTA NECK FINDINGS Aortic arch: Aortic atherosclerotic calcification. No aneurysm or dissection. Branching pattern is normal without flow limiting stenosis. Right carotid system: Common carotid artery widely patent to the bifurcation and ICA bulb. Somewhat irregular soft and calcified plaque affecting the ICA bulb with minimal diameter of 3.4 mm. Compared to a more distal cervical ICA diameter of 5 mm, this indicates a 30% stenosis. Vessels are tortuous, swinging to the midline behind the oropharynx. Left carotid system: Common carotid artery is patent to the bifurcation. Severe irregular calcified plaque at the carotid bifurcation and ICA bulb. Luminal stenosis likely 1 mm. Compared to a more distal cervical ICA diameter of 5 mm, this indicates an 80% stenosis. Vessels are tortuous, swinging to the midline behind the oropharynx. Vertebral arteries: Calcified plaque at both vertebral artery origin regions but without stenosis greater than 30%. Scattered areas of calcified plaque through the cervical course of both vertebral arteries but no stenosis greater than 30%. Skeleton: Degenerative cervical spondylosis. Other neck: No mass or lymphadenopathy. Upper  chest: Emphysema and pulmonary scarring at the apices. Review of the MIP images confirms the above findings CTA HEAD FINDINGS Anterior circulation: Both internal carotid arteries are patent through the skull base and siphon regions. Ordinary siphon atherosclerotic calcification but without stenosis greater than 30%. Anterior and middle cerebral vessels are patent without large or medium vessel occlusion, aneurysm or vascular malformation. Posterior circulation: Both vertebral arteries are patent through the foramen magnum. There is atherosclerotic disease at both vertebral artery V4 segments with stenosis estimated at 50% on both sides. Both vessels supply the basilar. No basilar stenosis. Basilar tip  aneurysm with wide mouth redemonstrated, unchanged in size at 9 x 6 x 6 mm. Venous sinuses: Patent and normal. Anatomic variants: None significant. Review of the MIP images confirms the above findings IMPRESSION: No acute large or medium vessel occlusion. Aortic Atherosclerosis (ICD10-I70.0) and Emphysema (ICD10-J43.9). Atherosclerotic disease at both carotid bifurcation and ICA bulb regions. 30% ICA stenosis on the right. 80% or greater irregular stenosis of the ICA bulb on the left. Vessels are tortuous, swinging to the midline behind the oropharynx. Atherosclerotic disease of both vertebral artery V4 segments with stenoses estimated at 50%. 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 Electronically Signed   By: Nelson Chimes M.D.   On: 12/02/2019 15:56   CT Head Wo Contrast  Result Date: 12/16/2019 CLINICAL DATA:  Near syncopal episode today. EXAM: CT HEAD WITHOUT CONTRAST TECHNIQUE: Contiguous axial images were obtained from the base of the skull through the vertex without intravenous contrast. COMPARISON:  Brain MRI 12/02/2019.  Head CT scan 12/01/2019. FINDINGS: Brain: No evidence of acute infarction, hemorrhage, hydrocephalus, extra-axial  collection or mass lesion/mass effect. Extensive chronic microvascular ischemic change again seen. Lacunar infarctions in the basal ganglia and thalami also again seen. Vascular: No hyperdense vessel or unexpected calcification. Skull: Intact.  No focal lesion. Sinuses/Orbits: Negative. Other: None. IMPRESSION: No acute abnormality. Extensive chronic microvascular ischemic disease. Electronically Signed   By: Inge Rise M.D.   On: 12/16/2019 21:40   CT HEAD WO CONTRAST  Result Date: 12/01/2019 CLINICAL DATA:  Slurred speech x2 days. EXAM: CT HEAD WITHOUT CONTRAST TECHNIQUE: Contiguous axial images were obtained from the base of the skull through the vertex without intravenous contrast. COMPARISON:  Dec 19, 2017 FINDINGS: Brain: There is mild cerebral atrophy with widening of the extra-axial spaces and ventricular dilatation. There are areas of decreased attenuation within the white matter tracts of the supratentorial brain, consistent with microvascular disease changes. Small chronic bilateral basal ganglia lacunar infarcts are seen. 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 Skull: Normal. Negative for fracture or focal lesion. Sinuses/Orbits: No acute finding. Other: None. IMPRESSION: 1. Generalized cerebral atrophy. 2. No acute intracranial abnormality. 3. 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 4. Small chronic bilateral basal ganglia lacunar infarcts. Electronically Signed   By: Virgina Norfolk M.D.   On: 12/01/2019 20:16   CT ANGIO NECK W OR WO CONTRAST  Result Date: 12/02/2019 CLINICAL DATA:  Carotid artery stenosis. Recent slurred speech and mental status changes. Basilar tip aneurysm. Left carotid stenosis by ultrasound. EXAM: CT ANGIOGRAPHY HEAD AND NECK TECHNIQUE: Multidetector CT imaging of the head and neck was performed  using the standard protocol during bolus administration of intravenous contrast. Multiplanar CT image reconstructions and MIPs were obtained to evaluate the vascular anatomy. Carotid stenosis measurements (when applicable) are obtained utilizing NASCET criteria, using the distal internal carotid diameter as the denominator. CONTRAST:  63mL OMNIPAQUE IOHEXOL 350 MG/ML SOLN COMPARISON:  MRI same day. Ultrasound same day. MRI 08/26/2013. CT angiography 08/19/2013. FINDINGS: CT HEAD FINDINGS Brain: Brain atrophy with chronic small-vessel ischemic changes affecting the pons, thalami, basal ganglia and hemispheric white matter. No sign of acute infarction, mass lesion, hemorrhage, hydrocephalus or extra-axial collection. Vascular: There is atherosclerotic calcification of the major vessels at the base of the brain. 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 See below.  Skull: Negative Sinuses: Clear sinuses. Orbits: Lens calcification on the right. Review of the MIP images confirms the above findings CTA NECK FINDINGS Aortic arch: Aortic atherosclerotic calcification. No aneurysm or dissection. Branching pattern is normal without flow limiting stenosis. Right carotid system: Common carotid artery widely patent to the bifurcation and ICA bulb. Somewhat irregular soft and calcified plaque affecting the ICA bulb with minimal diameter of 3.4 mm. Compared to a more distal cervical ICA diameter of 5 mm, this indicates a 30% stenosis. Vessels are tortuous, swinging to the midline behind the oropharynx. Left carotid system: Common carotid artery is patent to the bifurcation. Severe irregular calcified plaque at the carotid bifurcation and ICA bulb. Luminal stenosis likely 1 mm. Compared to a more distal cervical ICA diameter of 5 mm, this indicates an 80% stenosis. Vessels are tortuous, swinging to the midline behind the oropharynx. Vertebral arteries: Calcified plaque at both vertebral artery origin regions but without stenosis greater  than 30%. Scattered areas of calcified plaque through the cervical course of both vertebral arteries but no stenosis greater than 30%. Skeleton: Degenerative cervical spondylosis. Other neck: No mass or lymphadenopathy. Upper chest: Emphysema and pulmonary scarring at the apices. Review of the MIP images confirms the above findings CTA HEAD FINDINGS Anterior circulation: Both internal carotid arteries are patent through the skull base and siphon regions. Ordinary siphon atherosclerotic calcification but without stenosis greater than 30%. Anterior and middle cerebral vessels are patent without large or medium vessel occlusion, aneurysm or vascular malformation. Posterior circulation: Both vertebral arteries are patent through the foramen magnum. There is atherosclerotic disease at both vertebral artery V4 segments with stenosis estimated at 50% on both sides. Both vessels supply the basilar. No basilar stenosis. 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 Venous sinuses: Patent and normal. Anatomic variants: None significant. Review of the MIP images confirms the above findings IMPRESSION: No acute large or medium vessel occlusion. Aortic Atherosclerosis (ICD10-I70.0) and Emphysema (ICD10-J43.9). Atherosclerotic disease at both carotid bifurcation and ICA bulb regions. 30% ICA stenosis on the right. 80% or greater irregular stenosis of the ICA bulb on the left. Vessels are tortuous, swinging to the midline behind the oropharynx. Atherosclerotic disease of both vertebral artery V4 segments with stenoses estimated at 50%. 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 Electronically Signed   By: Nelson Chimes M.D.   On: 12/02/2019 15:56   CT Angio Chest PE W and/or Wo Contrast  Result Date: 12/16/2019 CLINICAL DATA:  Shortness of breath, near syncope EXAM: CT ANGIOGRAPHY CHEST WITH CONTRAST TECHNIQUE:  Multidetector CT imaging of the chest was performed using the standard protocol during bolus administration of intravenous contrast. Multiplanar CT image reconstructions and MIPs were obtained to evaluate the vascular anatomy. CONTRAST:  63mL OMNIPAQUE IOHEXOL 350 MG/ML SOLN COMPARISON:  06/30/2011, 12/05/2019 FINDINGS: Cardiovascular: This is a technically adequate evaluation of the pulmonary vasculature. No filling defects or pulmonary emboli. The heart is normal without pericardial effusion. There is extensive atherosclerosis of the thoracic aorta, with no aneurysm or dissection. Significant atherosclerosis throughout the coronary vasculature greatest in the LAD distribution. Mediastinum/Nodes: No enlarged mediastinal, hilar, or axillary lymph nodes. Thyroid gland, trachea, and esophagus demonstrate no significant findings. Small hiatal hernia is noted. Lungs/Pleura: Upper lobe predominant emphysema is seen, with scattered areas of air trapping. No airspace disease, effusion, or pneumothorax. Central airways are patent. Upper Abdomen:  No acute abnormality. Musculoskeletal: No acute or destructive bony lesions. Reconstructed images demonstrate no additional findings. Review of the MIP images confirms the above findings. IMPRESSION: 1. No CT evidence of pulmonary embolism. 2. Small hiatal hernia. 3. Aortic Atherosclerosis (ICD10-I70.0) and Emphysema (ICD10-J43.9). Electronically Signed   By: Randa Ngo M.D.   On: 12/16/2019 21:40   MR BRAIN WO CONTRAST  Result Date: 12/02/2019 CLINICAL DATA:  Neuro deficit, acute, stroke suspected. Additional history provided: Acute onset slurred speech, headache, mild nausea without vomiting EXAM: MRI HEAD WITHOUT CONTRAST TECHNIQUE: Multiplanar, multiecho pulse sequences of the brain and surrounding structures were obtained without intravenous contrast. COMPARISON:  Noncontrast head CT 12/01/2019, noncontrast head CT 12/19/2017, brain MRI 08/26/2013, CT angiogram head  08/19/2013 FINDINGS: Brain: Multiple sequences are significantly motion degraded. Most notably there is moderate motion degradation of the axial and coronal diffusion-weighted sequence, moderate motion degradation of the axial SWI sequence, moderate motion degradation of the axial T1 weighted sequence, moderate/severe motion degradation of the axial T2 FLAIR sequence and moderate/severe motion degradation of the coronal T2 weighted sequence. Moderate to advanced patchy and confluent T2/FLAIR hyperintensity within the cerebral white matter is nonspecific, but consistent with chronic small vessel ischemic disease. Findings have progressed as compared to prior MRI 08/26/2013. Redemonstrated chronic lacunar infarcts within the bilateral basal ganglia. Also again seen, there are chronic small-vessel ischemic changes within the thalami and pons. Chronic microhemorrhages within the right frontal lobe, left periatrial region and left cerebellum not definitively present on a prior MRI. Stable, mild generalized parenchymal atrophy. There is no acute infarct. No evidence of intracranial mass. No extra-axial fluid collection. No midline shift. Vascular: Expected proximal arterial flow voids. 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 Skull and upper cervical spine: No focal marrow lesion Sinuses/Orbits: Visualized orbits show no acute finding. Mild ethmoid and right maxillary sinus mucosal thickening. No significant mastoid effusion. IMPRESSION: 1. Motion degraded examination as described. 2. No evidence of acute intracranial abnormality, including acute infarction. 3. Moderate to advanced chronic small vessel ischemic disease has progressed as compared to prior MRI 08/26/2013. Redemonstrated chronic lacunar infarcts within the bilateral basal ganglia. 4. Nonspecific chronic microhemorrhages within the right frontal lobe, left periatrial region and  left cerebellum which were not definitively present on the prior MRI. 5. Stable, mild generalized parenchymal atrophy. 6. 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 Electronically Signed   By: Kellie Simmering DO   On: 12/02/2019 08:26   US Carotid Bilateral (at [facility] and [facility] only)  Result Date: 12/02/2019 CLINICAL DATA:  84 year old female with TIA EXAM: BILATERAL CAROTID DUPLEX ULTRASOUND TECHNIQUE: Pearline Cables scale imaging, color Doppler and duplex ultrasound were performed of bilateral carotid and vertebral arteries in the neck. COMPARISON:  None. FINDINGS: Criteria: Quantification of carotid stenosis is based on velocity parameters that correlate the residual internal carotid diameter with NASCET-based stenosis levels, using the diameter of the distal internal carotid lumen as the denominator for stenosis measurement. The following velocity measurements were obtained: RIGHT ICA:  Systolic 99991111 cm/sec, Diastolic 21 cm/sec CCA:  69 cm/sec SYSTOLIC ICA/CCA RATIO:  2.0 ECA:  120 cm/sec LEFT ICA:  Systolic 0000000 cm/sec, Diastolic 21 cm/sec CCA:  75 cm/sec SYSTOLIC ICA/CCA RATIO:  3.8 ECA:  1 4 cm/sec Right Brachial SBP: Not acquired Left Brachial SBP: Not acquired RIGHT CAROTID ARTERY: No significant calcifications of the  right common carotid artery. Intermediate waveform maintained. Moderate heterogeneous and partially calcified plaque at the right carotid bifurcation. No significant lumen shadowing. Low resistance waveform of the right ICA. Tortuosity RIGHT VERTEBRAL ARTERY: Antegrade flow with low resistance waveform. LEFT CAROTID ARTERY: No significant calcifications of the left common carotid artery. Intermediate waveform maintained. Moderate heterogeneous and partially calcified plaque at the left carotid bifurcation. No significant lumen shadowing. Low resistance waveform of the left ICA. Tortuosity LEFT VERTEBRAL ARTERY:  Antegrade flow with low  resistance waveform. IMPRESSION: Right: Color duplex indicates moderate heterogeneous and calcified plaque, with no hemodynamically significant stenosis by duplex criteria in the extracranial cerebrovascular circulation. Left: Heterogeneous and partially calcified plaque at the left carotid bifurcation, with discordant results regarding degree of stenosis by established duplex criteria. Peak velocity suggests 70% - 99% stenosis, with the ICA/ CCA ratio suggesting a lesser degree of stenosis. If establishing a more accurate degree of stenosis is required, cerebral angiogram should be considered, or as a second best test, CTA. Signed, Dulcy Fanny. Dellia Nims, RPVI [facility] [facility] Radiology Electronically Signed   By: Corrie Mckusick D.O.   On: 12/02/2019 11:33   PERIPHERAL VASCULAR CATHETERIZATION  Result Date: 12/04/2019 See op note  DG Chest Port 1 View  Result Date: 12/05/2019 CLINICAL DATA:  Shortness of breath. EXAM: PORTABLE CHEST 1 VIEW COMPARISON:  12/02/2019 FINDINGS: Heart size remains enlarged. Calcification of the thoracic aorta is similar to the prior study. No consolidation or sign of pleural effusion. Slight increase in density along the paraspinal region likely reflects hiatal hernia. Spinal degenerative changes. No acute or destructive bone process. IMPRESSION: 1. No active cardiopulmonary disease. 2. Stable cardiomegaly and aortic atherosclerosis. Electronically Signed   By: Zetta Bills M.D.   On: 12/05/2019 10:42   DG Chest Portable 1 View  Result Date: 12/02/2019 CLINICAL DATA:  Slurred speech x2 days. EXAM: PORTABLE CHEST 1 VIEW COMPARISON:  January 26, 2014 FINDINGS: Mild diffuse chronic appearing increased lung markings are seen. There is no evidence of acute infiltrate, pleural effusion or pneumothorax. The cardiac silhouette is moderately enlarged. There is marked severity calcification of the aortic arch. Multilevel degenerative changes seen  throughout the thoracic spine. IMPRESSION: Chronic-appearing increased lung markings without evidence of acute or active cardiopulmonary disease. Electronically Signed   By: Virgina Norfolk M.D.   On: 12/02/2019 00:33   ECHOCARDIOGRAM COMPLETE  Result Date: 12/03/2019    ECHOCARDIOGRAM REPORT   Patient Name:   RENAI RHYNER Date of Exam: 12/02/2019 Medical Rec #:  [id number]     Height:       61.0 in Accession #:    [id number]    Weight:       169.0 lb Date of Birth:  [birth year]      BSA:          1.758 m Patient Age:    27 years      BP:           156/69 mmHg Patient Gender: F             HR:           70 bpm. Exam Location:  [facility] Procedure: 2D Echo, Cardiac Doppler and Color Doppler Indications:     TIA 435.9  History:         Patient has no prior history of Echocardiogram examinations.                  COPD; Risk Factors:Hypertension.  Sonographer:     Sherrie Sport RDCS (AE) Referring Phys:  [id number] Arvella Merles MANSY Diagnosing Phys: Yolonda Kida MD IMPRESSIONS  1. Left ventricular ejection fraction, by estimation, is 65 to 70%. The left ventricle has normal function. The left ventricle has no regional wall motion abnormalities. There is mild concentric left ventricular hypertrophy. Left ventricular diastolic parameters are consistent with Grade I diastolic dysfunction (impaired relaxation).  2. Right ventricular systolic function is moderately reduced. The right ventricular size is moderately enlarged. Mildly increased right ventricular wall thickness. There is normal pulmonary artery systolic pressure.  3. The mitral valve is normal in structure. No evidence of mitral valve regurgitation.  4. The aortic valve is grossly normal. Aortic valve regurgitation is not visualized. FINDINGS  Left Ventricle: Left ventricular ejection fraction, by estimation, is 65 to 70%. The left ventricle has normal function. The left ventricle has no regional wall motion abnormalities. The left ventricular internal cavity size was  normal in size. There is  mild concentric left ventricular hypertrophy. Left ventricular diastolic parameters are consistent with Grade I diastolic dysfunction (impaired relaxation). Right Ventricle: The right ventricular size is moderately enlarged. Mildly increased right ventricular wall thickness. Right ventricular systolic function is moderately reduced. There is normal pulmonary artery systolic pressure. The tricuspid regurgitant velocity is 1.81 m/s, and with an assumed right atrial pressure of 10 mmHg, the estimated right ventricular systolic pressure is [id number] mmHg. Left Atrium: Left atrial size was normal in size. Right Atrium: Right atrial size was normal in size. Pericardium: Trivial pericardial effusion is present. Mitral Valve: The mitral valve is normal in structure. No evidence of mitral valve regurgitation. Tricuspid Valve: The tricuspid valve is normal in structure. Tricuspid valve regurgitation is trivial. Aortic Valve: The aortic valve is grossly normal. Aortic valve regurgitation is not visualized. Aortic valve mean gradient measures 3.0 mmHg. Aortic valve peak gradient measures 5.5 mmHg. Aortic valve area, by VTI measures 3.64 cm. Pulmonic Valve: The pulmonic valve was grossly normal. Pulmonic valve regurgitation is not visualized. Aorta: The aortic root is normal in size and structure. IAS/Shunts: No atrial level shunt detected by color flow Doppler.  LEFT VENTRICLE PLAX 2D LVIDd:         3.50 cm  Diastology LVIDs:         2.14 cm  LV e' lateral:   6.42 cm/s LV PW:         0.91 cm  LV E/e' lateral: 16.2 LV IVS:        1.21 cm  LV e' medial:    6.31 cm/s LVOT diam:     2.00 cm  LV E/e' medial:  16.5 LV SV:         82 LV SV Index:   47 LVOT Area:     3.14 cm  RIGHT VENTRICLE RV Basal diam:  2.26 cm LEFT ATRIUM             Index       RIGHT ATRIUM           Index LA diam:        3.40 cm 1.93 cm/m  RA Area:     11.70 cm LA Vol (A2C):   33.6 ml 19.11 ml/m RA Volume:   23.20 ml  13.19 ml/m LA Vol  (A4C):   33.9 ml 19.28 ml/m LA Biplane Vol: 33.6 ml 19.11 ml/m  AORTIC VALVE  PULMONIC VALVE AV Area (Vmax):    3.11 cm    PV Vmax:       0.70 m/s AV Area (Vmean):   3.72 cm    PV Peak grad:  2.0 mmHg AV Area (VTI):     3.64 cm AV Vmax:           117.00 cm/s AV Vmean:          79.400 cm/s AV VTI:            0.225 m AV Peak Grad:      5.5 mmHg AV Mean Grad:      3.0 mmHg LVOT Vmax:         116.00 cm/s LVOT Vmean:        93.900 cm/s LVOT VTI:          0.261 m LVOT/AV VTI ratio: 1.16  AORTA Ao Root diam: 3.10 cm MITRAL VALVE                TRICUSPID VALVE MV Area (PHT): 2.18 cm     TR Peak grad:   13.1 mmHg MV Decel Time: 348 msec     TR Vmax:        181.00 cm/s MV E velocity: 104.00 cm/s MV A velocity: 135.00 cm/s  SHUNTS MV E/A ratio:  0.77         Systemic VTI:  0.26 m                             Systemic Diam: 2.00 cm Dwayne Prince Rome MD Electronically signed by Yolonda Kida MD Signature Date/Time: 12/03/2019/6:39:32 AM    Final       ASSESSMENT/PLAN   Moderate Acute exacerbation of COPD  -agree with DUoneb and solumedrol 40 BID-changed to prednisone 50 po daily - 12/18/19  - d/cd Daliresp 540mcg daily-12/17/19 - continue zithromax 500mg   po daily  -MetaNEB q4h- decreased frequency to BID - 12/18/19  - adding Mucomyst 46ml 20% BID-Finished 12/18/19  - IS at bedside -continue PT/OT    Chronic hypoxemic respiratory failure  - patient is on 2L/min [place] and O2 saturation is >95%  - she is on home settings at this time  - recommend PT/OT and early mobilization    Thank you for allowing me to participate in the care of this patient.   Patient/Family are satisfied with care plan and all questions have been answered.  This document was prepared using Dragon voice recognition software and may include unintentional dictation errors.     Ottie Glazier, M.D.  Division of [place]

## 2019-12-18 NOTE — Care Management Obs Status (Signed)
[place] NOTIFICATION   Patient Details  Name: KEARNEY BAJO MRN: [id number] Date of Birth: [birth year]   Medicare Observation Status Notification Given:       Anselm Pancoast, RN 12/18/2019, 12:14 PM

## 2019-12-18 NOTE — Progress Notes (Signed)
MEWS of YELLOW due to increased HR. Pt Stable at baseline. Daughter at the bedside. MD Notified   12/18/19 0919  Vitals  Temp 98.5 F (36.9 C)  Temp Source Oral  BP 118/79  MAP (mmHg) 91  BP Location Right Arm  BP Method Automatic  Patient Position (if appropriate) Lying  Pulse Rate (!) 111  ECG Heart Rate (!) 111  Resp 17  Oxygen Therapy  SpO2 96 %  MEWS Score  MEWS Temp 0  MEWS Systolic 0  MEWS Pulse 2  MEWS RR 0  MEWS LOC 0  MEWS Score 2  MEWS Score Color Yellow

## 2019-12-18 NOTE — Progress Notes (Deleted)
Established patient visit   Patient: Toni White   DOB: [birth year]   84 y.o. Female  MRN: [id number] Visit Date: 12/19/2019  Today's healthcare provider: Lelon Huh, MD   No chief complaint on file.  Subjective    HPI Follow up Hospitalization  Patient was admitted to *** on *** and discharged on ***. She was treated for ***. Treatment for this included ***. Telephone follow up was done on *** She reports {excellent/good/fair:19992} compliance with treatment. She reports this condition is {resolved/improved/worsened:23923}.  ----------------------------------------------------------------------------------------- -   {Show patient history (optional):23778::" "}   Medications: Facility-Administered Medications Prior to Visit  Medication Dose Route Frequency Provider  . acetaminophen (TYLENOL) tablet 650 mg  650 mg Oral Q6H PRN Mansy, Jan A, MD   Or  . acetaminophen (TYLENOL) suppository 650 mg  650 mg Rectal Q6H PRN Mansy, Jan A, MD  . alum & mag hydroxide-simeth (MAALOX/MYLANTA) 200-200-20 MG/5ML suspension 30 mL  30 mL Oral Q6H PRN Mansy, Jan A, MD  . amLODipine (NORVASC) tablet 5 mg  5 mg Oral Daily Mansy, Jan A, MD  . aspirin chewable tablet 81 mg  81 mg Oral Daily Mansy, Jan A, MD  . azithromycin (ZITHROMAX) tablet 500 mg  500 mg Oral Daily Ottie Glazier, MD  . citalopram (CELEXA) tablet 20 mg  20 mg Oral Daily Mansy, Jan A, MD  . clopidogrel (PLAVIX) tablet 75 mg  75 mg Oral Daily Mansy, Jan A, MD  . enoxaparin (LOVENOX) injection 40 mg  40 mg Subcutaneous Q24H Mansy, Jan A, MD  . gabapentin (NEURONTIN) capsule 100 mg  100 mg Oral BID Mansy, Jan A, MD  . ipratropium-albuterol (DUONEB) 0.5-2.5 (3) MG/3ML nebulizer solution 3 mL  3 mL Nebulization Q6H Danford, Suann Larry, MD  . levothyroxine (SYNTHROID) tablet 37.5 mcg  37.5 mcg Oral Q0600 Mansy, Jan A, MD  . magnesium hydroxide (MILK OF MAGNESIA) suspension 30 mL  30 mL Oral Daily PRN Mansy, Jan A, MD  .  multivitamin-lutein (OCUVITE-LUTEIN) capsule 1 capsule  1 capsule Oral BID Mansy, Jan A, MD  . ondansetron [facility]) tablet 4 mg  4 mg Oral Q6H PRN Mansy, Jan A, MD   Or  . ondansetron [facility]) injection 4 mg  4 mg Intravenous Q6H PRN Mansy, Jan A, MD  . pravastatin (PRAVACHOL) tablet 40 mg  40 mg Oral QHS Mansy, Jan A, MD  . Derrill Memo ON 12/19/2019] predniSONE (DELTASONE) tablet 50 mg  50 mg Oral Q breakfast Aleskerov, Fuad, MD  . sodium chloride flush (NS) 0.9 % injection 3 mL  3 mL Intravenous Once Duffy Bruce, MD  . sodium chloride flush (NS) 0.9 % injection 3 mL  3 mL Intravenous Q12H Mansy, Jan A, MD  . traMADol Veatrice Bourbon) tablet 50 mg  50 mg Oral Q6H PRN Mansy, Arvella Merles, MD   Outpatient Medications Prior to Visit  Medication Sig  . albuterol (VENTOLIN HFA) 108 (90 BASE) MCG/ACT inhaler Inhale 2 puffs into the lungs. Every 4-6 hours as needed  . alendronate (FOSAMAX) 70 MG tablet Take 1 tablet (70 mg total) by mouth once a week. Take with a full glass of water on an empty stomach. (Patient taking differently: Take 70 mg by mouth once a week. Take with a full glass of water on an empty stomach. (SUNDAYS))  . amLODipine (NORVASC) 5 MG tablet Take 1 tablet (5 mg total) by mouth daily.  Marland Kitchen aspirin 81 MG tablet Take 81 mg by mouth daily.  . Calcium-Magnesium-Vitamin  D (CALCIUM 500 PO) Take 1 tablet by mouth 2 (two) times daily.  . clopidogrel (PLAVIX) 75 MG tablet Take 1 tablet (75 mg total) by mouth daily.  . colchicine 0.6 MG tablet Take 2 tablets (1.2 mg) on first day of gout flare. Then, take 1 tablet (0.6mg ) daily until resolved.  . Fluticasone-Salmeterol (ADVAIR) 250-50 MCG/DOSE AEPB Inhale 1 puff into the lungs 2 (two) times daily.  . furosemide (LASIX) 20 MG tablet TAKE 1 TABLET TWICE DAILY AS NEEDED  FOR  SWELLING (Patient taking differently: Take 20 mg by mouth daily. )  . gabapentin (NEURONTIN) 100 MG capsule Take 1 capsule (100 mg total) by mouth 2 (two) times daily.  Marland Kitchen levothyroxine  (SYNTHROID) 25 MCG tablet TAKE 1 AND 1/2 TABLETS EVERY DAY BEFORE BREAKFAST  . metoprolol succinate (TOPROL-XL) 50 MG 24 hr tablet TAKE 1/2 TABLET EVERY DAY  . Multiple Vitamins-Minerals (PRESERVISION AREDS 2+MULTI VIT) CAPS Take 1 capsule by mouth 2 (two) times daily.  . pantoprazole (PROTONIX) 40 MG tablet TAKE 1 TABLET EVERY DAY  . pravastatin (PRAVACHOL) 40 MG tablet TAKE 1 TABLET (40 MG TOTAL) BY MOUTH AT BEDTIME.  . roflumilast (DALIRESP) 500 MCG TABS tablet Take 500 mcg by mouth daily.  Marland Kitchen tiotropium (SPIRIVA) 18 MCG inhalation capsule Place 18 mcg into inhaler and inhale daily.  . traMADol (ULTRAM) 50 MG tablet TAKE 1 TABLET BY MOUTH EVERY 8 HOURS    Review of Systems  {Show previous labs (optional):23779::" "}  Objective    There were no vitals taken for this visit. {Show previous vital signs (optional):23777::" "}  Physical Exam  ***  No results found for any visits on 12/19/19.  Assessment & Plan     ***  No follow-ups on file.      {provider attestation***:1}   Lelon Huh, MD  [facility] [phone] (phone) [phone] (fax)  [place]

## 2019-12-18 NOTE — Progress Notes (Signed)
PT Cancellation Note  Patient Details Name: Toni White MRN: [id number] DOB: Jul 06, 1936   Cancelled Treatment:    Reason Eval/Treat Not Completed: Other (comment).  PT consult received.  Chart reviewed.  Attempted to see pt earlier today but pt had just finished ambulating with nurse in room and elevated HR noted so therapist returned later after pt was able to rest and have lunch (per discussion with pt's nurse).  Upon therapist 2nd attempt, pt resting in bed receiving breathing treatment with respiratory therapist present (not available for PT session).  Will re-attempt PT evaluation at a later date/time as able.  Leitha Bleak, PT 12/18/19, 2:22 PM

## 2019-12-19 ENCOUNTER — Inpatient Hospital Stay: Payer: Medicare HMO

## 2019-12-19 ENCOUNTER — Inpatient Hospital Stay: Payer: Self-pay | Admitting: Family Medicine

## 2019-12-19 DIAGNOSIS — H547 Unspecified visual loss: Secondary | ICD-10-CM | POA: Diagnosis not present

## 2019-12-19 DIAGNOSIS — I69328 Other speech and language deficits following cerebral infarction: Secondary | ICD-10-CM | POA: Diagnosis not present

## 2019-12-19 DIAGNOSIS — R000 Tachycardia, unspecified: Secondary | ICD-10-CM

## 2019-12-19 DIAGNOSIS — I872 Venous insufficiency (chronic) (peripheral): Secondary | ICD-10-CM | POA: Diagnosis not present

## 2019-12-19 DIAGNOSIS — I10 Essential (primary) hypertension: Secondary | ICD-10-CM | POA: Diagnosis not present

## 2019-12-19 DIAGNOSIS — I69322 Dysarthria following cerebral infarction: Secondary | ICD-10-CM | POA: Diagnosis not present

## 2019-12-19 DIAGNOSIS — J9611 Chronic respiratory failure with hypoxia: Secondary | ICD-10-CM | POA: Diagnosis not present

## 2019-12-19 DIAGNOSIS — J449 Chronic obstructive pulmonary disease, unspecified: Secondary | ICD-10-CM | POA: Diagnosis not present

## 2019-12-19 DIAGNOSIS — E785 Hyperlipidemia, unspecified: Secondary | ICD-10-CM

## 2019-12-19 LAB — BASIC METABOLIC PANEL
Anion gap: 14 (ref 5–15)
BUN: 22 mg/dL (ref 8–23)
CO2: 29 mmol/L (ref 22–32)
Calcium: 9.1 mg/dL (ref 8.9–10.3)
Chloride: 102 mmol/L (ref 98–111)
Creatinine, Ser: 0.58 mg/dL (ref 0.44–1.00)
GFR calc Af Amer: >60 mL/min (ref >60)
GFR calc non Af Amer: >60 mL/min (ref >60)
Glucose, Bld: 96 mg/dL (ref 70–99)
Potassium: 2.9 mmol/L — ABNORMAL LOW (ref 3.5–5.1)
Sodium: 145 mmol/L (ref 135–145)

## 2019-12-19 LAB — TROPONIN I (HIGH SENSITIVITY): Troponin I (High Sensitivity): 11 ng/L (ref <18)

## 2019-12-19 LAB — CBC
HCT: 37.7 % (ref 36.0–46.0)
Hemoglobin: 12.1 g/dL (ref 12.0–15.0)
MCH: 29.7 pg (ref 26.0–34.0)
MCHC: 32.1 g/dL (ref 30.0–36.0)
MCV: 92.6 fL (ref 80.0–100.0)
Platelets: 484 10*3/uL — ABNORMAL HIGH (ref 150–400)
RBC: 4.07 MIL/uL (ref 3.87–5.11)
RDW: 13.9 % (ref 11.5–15.5)
WBC: 18.1 10*3/uL — ABNORMAL HIGH (ref 4.0–10.5)
nRBC: 0 % (ref 0.0–0.2)

## 2019-12-19 LAB — PROCALCITONIN: Procalcitonin: <0.1 ng/mL

## 2019-12-19 LAB — MAGNESIUM: Magnesium: 1.8 mg/dL (ref 1.7–2.4)

## 2019-12-19 MED ORDER — POTASSIUM CHLORIDE CRYS ER 20 MEQ PO TBCR
40.0000 meq | EXTENDED_RELEASE_TABLET | Freq: Once | ORAL | Status: AC
  Administered 2019-12-19: 09:00:00 40 meq via ORAL
  Filled 2019-12-19: qty 2

## 2019-12-19 MED ORDER — LEVALBUTEROL HCL 0.63 MG/3ML IN NEBU
0.6300 mg | INHALATION_SOLUTION | Freq: Four times a day (QID) | RESPIRATORY_TRACT | Status: DC | PRN
  Filled 2019-12-19: qty 3

## 2019-12-19 MED ORDER — METOPROLOL SUCCINATE ER 25 MG PO TB24
25.0000 mg | ORAL_TABLET | Freq: Every day | ORAL | Status: DC
  Administered 2019-12-19 – 2019-12-24 (×6): 25 mg via ORAL
  Filled 2019-12-19 (×7): qty 1

## 2019-12-19 NOTE — Progress Notes (Signed)
Pt Toni White, Pt resting in bed. MD ordered 2V xray. Pt appears a bit more disoriented today.  Radiology sending Transport for pt now. Will continue to monitor for deviations from baseline.    12/19/19 1101  Vitals  Temp 98 F (36.7 C)  Temp Source Oral  BP (!) 143/52  MAP (mmHg) 77  Pulse Rate 100  ECG Heart Rate 100  Resp 20  Oxygen Therapy  SpO2 94 %  Toni Score  Toni Temp 0  Toni Systolic 0  Toni Pulse 0  Toni RR 0  Toni LOC 0  Toni Score 0  Toni Score Color Nyoka Cowden

## 2019-12-19 NOTE — Progress Notes (Signed)
Continuing to follow protocol regarding MEWS score. Hourly vitals completed and patient noted to be increasingly hypertensive. Provider notified; awaiting response.     12/19/19 0658  Vitals  BP (!) 150/100  MAP (mmHg) 118  Pulse Rate (!) 126  ECG Heart Rate (!) 128  Resp 16  Oxygen Therapy  SpO2 92 %  O2 Device Nasal Cannula  O2 Flow Rate (L/min) 2 L/min  MEWS Score  MEWS Temp 0  MEWS Systolic 0  MEWS Pulse 2  MEWS RR 0  MEWS LOC 0  MEWS Score 2  MEWS Score Color Yellow  Provider Notification  Provider Name/Title Rufina Falco  Date Provider Notified 12/19/19  Time Provider Notified [phone]  Notification Type Page (secure chat)  Notification Reason Other (Comment) (hypertension)  Response Other (Comment) (awaiting response)

## 2019-12-19 NOTE — Progress Notes (Signed)
Following MEWS protocol. Follow up assessment completed.     12/19/19 0500  Assess: MEWS Score  Temp 98 F (36.7 C)  BP (!) 139/57  Pulse Rate (!) 132  ECG Heart Rate (!) 132  Resp (!) 25  Level of Consciousness Alert  SpO2 93 %  O2 Device Nasal Cannula  O2 Flow Rate (L/min) 2 L/min  Assess: MEWS Score  MEWS Temp 0  MEWS Systolic 0  MEWS Pulse 3  MEWS RR 1  MEWS LOC 0  MEWS Score 4  MEWS Score Color Red  Assess: if the MEWS score is Yellow or Red  Were vital signs taken at a resting state? Yes  Focused Assessment Documented focused assessment  Early Detection of Sepsis Score *See Row Information* Medium  Treat  MEWS Interventions Other (Comment) (Followed up with Charge RN)

## 2019-12-19 NOTE — Progress Notes (Signed)
Pt has MEWS of Yellow. Currently Asymptomatic, resting in bed, breakfast tray arrived, encouraged pt to try to eat. Will continue to monitor and reassess vitals in 2 hours.    12/19/19 0800  Vitals  Temp 98.4 F (36.9 C)  BP (!) 144/57  MAP (mmHg) 89  Pulse Rate (!) 128  ECG Heart Rate (!) 128  Resp 20  Oxygen Therapy  SpO2 93 %  MEWS Score  MEWS Temp 0  MEWS Systolic 0  MEWS Pulse 2  MEWS RR 0  MEWS LOC 0  MEWS Score 2  MEWS Score Color Yellow

## 2019-12-19 NOTE — Progress Notes (Signed)
PROGRESS NOTE    Toni White  [id number] DOB: [birth year] DOA: 12/16/2019 PCP: Birdie Sons, MD      Brief Narrative:  Toni White is a 84 y.o. F with hx COPD on home O2 2L, FEV1 22%, recent stroke, and HTN who presented with few days progressive DOE and then pre-syncope.  Patient was getting ready to get in the car, and slumped to the ground weak, nearly passed out.  In the ER, ECG and troponins normal (there was an initial spurious elevated troponin, repeats both normal).  CTA chest showed no pneumonia or PE.        Assessment & Plan:  COPD exacerbation Patient had developed increased respiratory symptoms recently, also worse hypoxia with ambulation than baseline.  Had been treated with oral prednisone as outpt but failed and symptoms worsened.  Yesterday able to ambulate with nursing without symptoms but noted exertional tachycardia.  Overnight, tachycardic at rest.  Symptoms worse today, worse cough, malaise.  -Continue prednisone -Continue Xopenex due to tachycardia -Continue azithromycin -Consult Pulmonology, appreciate expert advice   Acute metabolic encephalopathy Patient is more disoriented today, asking by family members with past away.  She feels warm on my exam and has worse cough. -Obtain 2 view chest x-ray -Check procalcitonin, and started on antibiotics if pneumonia seems likely  Sinus tachycardia Overnight, patient with sinus tachycardia.  Doubt infection.  This worsened after Lasix.   Overnight, ECG obtained. -Hold albuterol, replace with Xopenex -Resume metoprolol -Replete K -Check mag   Pre-syncope This was the initial presenting complaint, patient orthostatic on arrival.  Given fluids, metoprolol held, orthostatics resolved.  Yesterday given Lasix, now tachycardic at rest.   -Resume metoprolol  Hypokalemia After Lasix -Replete K  Hypertension BP elevated -Continue amlodipine -Resume metorpolol  Recent TIA -Continue aspirin,  Plavix  Mood -Continue citalopram  Peripheral neuropathy -Continue gabapentin  Hypothyroidism -Continue levothyroxine       Disposition: Status is: Inpatient  The patient has a new acute metabolic encephalopathy/confusion, and she requires ongoing hospital level care.       Dispo: The patient is from: Home              Anticipated d/c is to: TBD              Anticipated d/c date is: 2 to 3 days at least                                    MDM: The below labs and imaging reports reviewed and summarized above.  Medication management as above.    This is a severe exacerbation of severe COPD    DVT prophylaxis: Lovenox Code Status: DNR Family Communication: Daughter at bedside    Consultants:   Pulmonology  Procedures:     Antimicrobials:   Azithromycin 5/12 >>  Culture data:              Subjective: Patient confused overnight, awake several times overnight, agitated, disoriented.  Tachycardic after Lasix yesterday.  No new fever, but cough is worse.   Dyspneic and quite weak.  Only able to walk a short distance.       Objective: Vitals:   12/19/19 1000 12/19/19 1014 12/19/19 1055 12/19/19 1101  BP: 120/87   (!) 143/52  Pulse: (!) 107 (!) 106 98 100  Resp: (!) 23 20 (!) 26 20  Temp:   98.8 F (37.1 C)  98 F (36.7 C)  TempSrc:   Oral Oral  SpO2: 96% 96% 94% 94%  Weight:      Height:        Intake/Output Summary (Last 24 hours) at 12/19/2019 1319 Last data filed at 12/18/2019 2302 Gross per 24 hour  Intake --  Output 2000 ml  Net -2000 ml   Filed Weights   12/16/19 1454  Weight: 77.1 kg    Examination: General appearance: Frail elderly female, lying in bed, appears debilitated and weak.  No obvious distress.     HEENT: Anicteric, right and conjunctivitis appears resolved, left eye, lids and lashes normal.  No nasal deformity, discharge, or epistaxis.  Nasal cannula in place.  Lips normal, oropharynx moist,  normal lesions, dentures in place, hearing diminished. Skin: No suspicious rashes or lesions. Cardiac: Tachycardic, regular, no murmurs, JVP not visible, no lower extremity edema Respiratory: Respiratory rate increased, lung sounds diminished bilaterally, rales bilaterally, no wheezing. Abdomen: No tenderness palpation or guarding, no hepatosplenomegaly. MSK: Diminished muscle mass diffusely, diminished subcutaneous fat. Neuro: Awake and attempts to answer questions, but is really generally weak, disoriented.  Speech fluent. Psych: Attention diminished, affect blunted, judgment insight appear moderately impaired by dementia        Data Reviewed: I have personally reviewed following labs and imaging studies:  CBC: Recent Labs  Lab 12/16/19 1504 12/17/19 0626 12/17/19 0922 12/19/19 0411  WBC 15.3* 13.3* 14.7* 18.1*  HGB 11.5* 13.1 11.9* 12.1  HCT 34.5* 39.7 36.6 37.7  MCV 90.6 91.3 92.4 92.6  PLT 362 345 367 [id number]*   Basic Metabolic Panel: Recent Labs  Lab 12/16/19 1504 12/16/19 2311 12/17/19 0626 12/19/19 0411  NA 138 140 141 145  K 3.9 3.8 3.8 2.9*  CL 99 97* 100 102  CO2 30 31 29 29   GLUCOSE 118* 100* 88 96  BUN 10 10 11 22   CREATININE 0.71 0.58 0.53 0.58  CALCIUM 9.7 9.6 9.4 9.1  MG  --   --   --  1.8   GFR: Estimated Creatinine Clearance: 51.8 mL/min (by C-G formula based on SCr of 0.58 mg/dL). Liver Function Tests: No results for input(s): AST, ALT, ALKPHOS, BILITOT, PROT, ALBUMIN in the last 168 hours. No results for input(s): LIPASE, AMYLASE in the last 168 hours. No results for input(s): AMMONIA in the last 168 hours. Coagulation Profile: Recent Labs  Lab 12/17/19 0737  INR 1.1   Cardiac Enzymes: No results for input(s): CKTOTAL, CKMB, CKMBINDEX, TROPONINI in the last 168 hours. BNP (last 3 results) No results for input(s): PROBNP in the last 8760 hours. HbA1C: No results for input(s): HGBA1C in the last 72 hours. CBG: Recent Labs  Lab  12/17/19 0611 12/18/19 0457  GLUCAP 82 140*   Lipid Profile: No results for input(s): CHOL, HDL, LDLCALC, TRIG, CHOLHDL, LDLDIRECT in the last 72 hours. Thyroid Function Tests: Recent Labs    12/17/19 0922  TSH 1.207   Anemia Panel: No results for input(s): VITAMINB12, FOLATE, FERRITIN, TIBC, IRON, RETICCTPCT in the last 72 hours. Urine analysis:    Component Value Date/Time   COLORURINE YELLOW (A) 12/16/2019 1504   APPEARANCEUR HAZY (A) 12/16/2019 1504   APPEARANCEUR Hazy 01/26/2014 1040   LABSPEC 1.030 12/16/2019 1504   LABSPEC 1.017 01/26/2014 1040   PHURINE 6.0 12/16/2019 1504   GLUCOSEU NEGATIVE 12/16/2019 1504   GLUCOSEU Negative 01/26/2014 1040   HGBUR NEGATIVE 12/16/2019 1504   BILIRUBINUR NEGATIVE 12/16/2019 1504   BILIRUBINUR Negative 01/26/2014 1040   KETONESUR  20 (A) 12/16/2019 1504   PROTEINUR NEGATIVE 12/16/2019 1504   NITRITE NEGATIVE 12/16/2019 1504   LEUKOCYTESUR SMALL (A) 12/16/2019 1504   LEUKOCYTESUR Negative 01/26/2014 1040   Sepsis Labs: @LABRCNTIP (procalcitonin:4,lacticacidven:4)  ) Recent Results (from the past 240 hour(s))  SARS Coronavirus 2 by RT PCR (hospital order, performed in [facility] hospital lab) Nasopharyngeal Nasopharyngeal Swab     Status: None   Collection Time: 12/16/19  9:14 PM   Specimen: Nasopharyngeal Swab  Result Value Ref Range Status   SARS Coronavirus 2 NEGATIVE NEGATIVE Final    Comment: (NOTE) SARS-CoV-2 target nucleic acids are NOT DETECTED. The SARS-CoV-2 RNA is generally detectable in upper and lower respiratory specimens during the acute phase of infection. The lowest concentration of SARS-CoV-2 viral copies this assay can detect is 250 copies / mL. A negative result does not preclude SARS-CoV-2 infection and should not be used as the sole basis for treatment or other patient management decisions.  A negative result may occur with improper specimen collection / handling, submission of specimen other than  nasopharyngeal swab, presence of viral mutation(s) within the areas targeted by this assay, and inadequate number of viral copies (<250 copies / mL). A negative result must be combined with clinical observations, patient history, and epidemiological information. Fact Sheet for Patients:   StrictlyIdeas.no Fact Sheet for Healthcare Providers: BankingDealers.co.za This test is not yet approved or cleared  by the [place] FDA and has been authorized for detection and/or diagnosis of SARS-CoV-2 by FDA under an Emergency Use Authorization (EUA).  This EUA will remain in effect (meaning this test can be used) for the duration of the COVID-19 declaration under Section 564(b)(1) of the Act, 21 U.S.C. section 360bbb-3(b)(1), unless the authorization is terminated or revoked sooner. Performed at [facility], [address] [place], [place] [address]          Radiology Studies: DG Chest 2 View  Result Date: 12/19/2019 CLINICAL DATA:  Cough, COPD, hypertension. EXAM: CHEST - 2 VIEW COMPARISON:  Chest x-rays dated 12/05/2019 and 12/02/2019. FINDINGS: Stable cardiomegaly. Lungs are clear. No pleural effusion or pneumothorax is seen. Probable hiatal hernia. No acute appearing osseous abnormality. IMPRESSION: 1. No active cardiopulmonary disease. No evidence of pneumonia or pulmonary edema. 2. Stable cardiomegaly. 3. Probable hiatal hernia. Electronically Signed   By: Franki Cabot M.D.   On: 12/19/2019 13:13        Scheduled Meds: . amLODipine  5 mg Oral Daily  . aspirin  81 mg Oral Daily  . azithromycin  500 mg Oral Daily  . citalopram  20 mg Oral Daily  . clopidogrel  75 mg Oral Daily  . enoxaparin (LOVENOX) injection  40 mg Subcutaneous Q24H  . gabapentin  100 mg Oral BID  . levothyroxine  37.5 mcg Oral Q0600  . metoprolol succinate  25 mg Oral Daily  . multivitamin-lutein  1 capsule Oral BID  . pravastatin  40 mg Oral  QHS  . predniSONE  50 mg Oral Q breakfast  . sodium chloride flush  3 mL Intravenous Once  . sodium chloride flush  3 mL Intravenous Q12H   Continuous Infusions:    LOS: 1 day    Time spent: 35 minutes    Edwin Dada, MD [facility] 12/19/2019, 1:19 PM     Please page though [employer] or Epic secure chat:  For password, contact charge nurse

## 2019-12-19 NOTE — Progress Notes (Signed)
   12/19/19 0400  Assess: MEWS Score  BP (!) 157/59  Pulse Rate (!) 132  ECG Heart Rate (!) 132  Resp (!) 22  SpO2 92 %  Assess: MEWS Score  MEWS Temp 0  MEWS Systolic 0  MEWS Pulse 3  MEWS RR 1  MEWS LOC 0  MEWS Score 4  MEWS Score Color Red  Assess: if the MEWS score is Yellow or Red  Were vital signs taken at a resting state? Yes  Focused Assessment Documented focused assessment  Treat  MEWS Interventions Other (Comment) (FNP [place] notified)  Take Vital Signs  Increase Vital Sign Frequency  Red: Q 1hr X 4 then Q 4hr X 4, if remains red, continue Q 4hrs  Escalate  MEWS: Escalate Red: discuss with charge nurse/RN and provider, consider discussing with RRT  Notify: Charge Nurse/RN  Name of Charge Nurse/RN Notified Phylis K., RN  Date Charge Nurse/RN Notified 12/19/19  Time Charge Nurse/RN Notified 0400  Notify: Provider  Provider Name/Title Rufina Falco, FNP  Date Provider Notified 12/19/19  Time Provider Notified [phone]  Notification Type Page  Notification Reason Change in status  Response No new orders  Date of Provider Response 12/19/19  Time of Provider Response [phone]

## 2019-12-19 NOTE — Plan of Care (Signed)
Patient has been alert and oriented throughout the shift. Her vitals have been abnormal. She has remained on telemetry monitoring and several calls were received over night. She has sustained a pulse of 120s-130s. She's also been hypertensive. Oxygenation has flucuated from in the low 90s. Her MEWS score has changed from green to yellow and is currently in the red. Hospitalist notifed. No interventions ordered due to patient's history of syncope. One hour vitals implemented based on MEWS scoring system. Provider updated as needed. EKG ordered and completed. Cardiology consulted regarding patient. Patient complained of pain and received one prn dose of Tramadol which was effective per her report. She has remained free of falls and injuries.   Problem: Education: Goal: Knowledge of General Education information will improve Description: Including pain rating scale, medication(s)/side effects and non-pharmacologic comfort measures Outcome: Not Progressing   Problem: Health Behavior/Discharge Planning: Goal: Ability to manage health-related needs will improve Outcome: Not Progressing   Problem: Clinical Measurements: Goal: Ability to maintain clinical measurements within normal limits will improve Outcome: Not Progressing Goal: Will remain free from infection Outcome: Not Progressing Goal: Diagnostic test results will improve Outcome: Not Progressing Goal: Respiratory complications will improve Outcome: Not Progressing Goal: Cardiovascular complication will be avoided Outcome: Not Progressing   Problem: Activity: Goal: Risk for activity intolerance will decrease Outcome: Not Progressing   Problem: Nutrition: Goal: Adequate nutrition will be maintained Outcome: Not Progressing   Problem: Coping: Goal: Level of anxiety will decrease Outcome: Not Progressing   Problem: Elimination: Goal: Will not experience complications related to bowel motility Outcome: Not Progressing Goal: Will not  experience complications related to urinary retention Outcome: Not Progressing   Problem: Pain Managment: Goal: General experience of comfort will improve Outcome: Not Progressing   Problem: Safety: Goal: Ability to remain free from injury will improve Outcome: Not Progressing   Problem: Skin Integrity: Goal: Risk for impaired skin integrity will decrease Outcome: Not Progressing

## 2019-12-19 NOTE — Progress Notes (Signed)
PT Cancellation Note  Patient Details Name: Toni White MRN: [id number] DOB: [birth year]   Cancelled Treatment:    Reason Eval/Treat Not Completed: Other (comment).  PT consult received.  Chart reviewed.  Pt gone for imaging earlier but now back in room resting in bed upon PT arrival.  Pt reporting that she did not feel good today and declined physical therapy (pt reports she was not up to doing anything)--nurse notified; visitor present and reports pt has been confused today and that MD was aware already.  Will re-attempt PT evaluation at a later date/time.  Leitha Bleak, PT 12/19/19, 11:53 AM

## 2019-12-19 NOTE — Progress Notes (Signed)
Phone call received from [place] regarding the patient being tachycardic. Patient assessed and noted to be asymptomatic. She complained of pain and received a dose of prn Tramadol. Hospitalist notified regarding patient sustaining a heart rate in the 120-130s for greater than 7 minutes.     12/19/19 0353  Assess: MEWS Score  Temp 98.3 F (36.8 C)  Pulse Rate (!) 135  SpO2 92 %     12/19/19 0345  Assess: MEWS Score  BP (!) 148/62  Pulse Rate (!) 131  ECG Heart Rate (!) 131  Resp 20  SpO2 (!) 89 %    Per provider, no beta blockers would be administered secondary to her syncope.

## 2019-12-19 NOTE — Progress Notes (Signed)
Pt MEWS RED. Protocol followed. Pt currently resting in bed, asymptomatic. Provider to come to bedside and assess pt. No new interventions at this time.   12/19/19 1820  Assess: MEWS Score  Temp 99 F (37.2 C)  BP (!) 130/106  Pulse Rate (!) 113  ECG Heart Rate (!) 113  Resp (!) 28  SpO2 90 %  O2 Device Nasal Cannula  Patient Activity (if Appropriate) In bed  O2 Flow Rate (L/min) 2 L/min  Assess: MEWS Score  MEWS Temp 0  MEWS Systolic 0  MEWS Pulse 2  MEWS RR 2  MEWS LOC 0  MEWS Score 4  MEWS Score Color Red  Assess: if the MEWS score is Yellow or Red  Were vital signs taken at a resting state? Yes  Focused Assessment Documented focused assessment  Early Detection of Sepsis Score *See Row Information* Low  Treat  MEWS Interventions Escalated (See documentation below)  Take Vital Signs  Increase Vital Sign Frequency  Red: Q 1hr X 4 then Q 4hr X 4, if remains red, continue Q 4hrs  Escalate  MEWS: Escalate Red: discuss with charge nurse/RN and provider, consider discussing with RRT  Notify: Charge Nurse/RN  Name of Charge Nurse/RN Notified Doloris Hall, RN  Date Charge Nurse/RN Notified 12/19/19  Time Charge Nurse/RN Notified 1825  Notify: Provider  Provider Name/Title Myrene Buddy, MD  Date Provider Notified 12/19/19  Time Provider Notified [phone]  Notification Type Call  Notification Reason Change in status  Response Other (Comment) (Provider will come to bedside to assess)  Date of Provider Response 12/19/19  Time of Provider Response [phone]

## 2019-12-19 NOTE — Progress Notes (Signed)
Pulmonary Medicine          Date: 12/19/2019,   MRN# [id number] Toni White 1936/03/16     AdmissionWeight: 77.1 kg                 CurrentWeight: 77.1 kg  Referring physician: Dr Loleta Books    CHIEF COMPLAINT:   Worsening shortness of breath at rest.    SUBJECTIVE   Patient laying in bed she was sleeping during my evaluation.  Daughter and roommate are at bedside.  Discussed hospital course and care plan.  Concern is patients mental status seems sluggish.  She did have CVA last month but also exhibits signs of dementia.  I have discussed potential neurology evaluation for this on outpatient.    Respiratory status has significantly improved and lung sounds are clear today.   She is frail/deconditioned and weak, I agree with rehab facility post d/c and prolonged PT/OT.   PAST MEDICAL HISTORY   Past Medical History:  Diagnosis Date  . COPD (chronic obstructive pulmonary disease) ([place])   . DNR (do not resuscitate) 11/2019   DNR/DNI, confirmed in person by patient and her daughter/POA  . History of chicken pox   . Hyperlipidemia   . Hypertension   . Ulcer      SURGICAL HISTORY   Past Surgical History:  Procedure Laterality Date  . ABDOMINAL HYSTERECTOMY  2003   with BSO due to bleeding  . APPENDECTOMY    . BREAST BIOPSY Left 2005   stereo breast biopsy benign  . BREAST BIOPSY Right 2005   core bx. benign  . BREAST BIOPSY Left 2000?   benign  . BREAST CYST EXCISION Left 1990   incision and draniage of abscess  . CAROTID PTA/STENT INTERVENTION Left 12/04/2019   Procedure: CAROTID PTA/STENT INTERVENTION;  Surgeon: Katha Cabal, MD;  Location: [facility];  Service: Cardiovascular;  Laterality: Left;  . CHOLECYSTECTOMY    . COLONOSCOPY  2003   Dr. Vira Agar. Hyperplastic polyps; Single polyp excision from rectum  . CT Scan of Brain  08/20/2013   Mild diffuse cortical atrophy. Mid chronic ischemic white matter disease. 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 [place]  . INCISION AND DRAINAGE BREAST ABSCESS Left 1990  . TONSILLECTOMY  1952  . UPPER GI ENDOSCOPY  02/26/2002   Dr. Tiffany Kocher; Gastritis. Single gastric ectasia     FAMILY HISTORY   Family History  Problem Relation Age of Onset  . Heart disease Father   . Congestive Heart Failure Father   . Heart disease Mother   . Congestive Heart Failure Mother   . Cancer Brother        lung  . Lung cancer Brother   . Diabetes Daughter   . Diabetes Daughter   . Breast cancer Neg Hx      SOCIAL HISTORY   Social History   Tobacco Use  . Smoking status: Former Smoker    Packs/day: 1.00    Years: 30.00    Pack years: 30.00    Types: Cigarettes  . Smokeless tobacco: Never Used  . Tobacco comment: quit around 1989  Substance Use Topics  . Alcohol use: No  . Drug use: No     MEDICATIONS    Home Medication:    Current Medication:  Current Facility-Administered Medications:  .  acetaminophen (TYLENOL) tablet 650 mg, 650 mg, Oral, Q6H PRN **OR** acetaminophen (TYLENOL) suppository 650 mg,  650 mg, Rectal, Q6H PRN, Mansy, Jan A, MD .  alum & mag hydroxide-simeth (MAALOX/MYLANTA) 200-200-20 MG/5ML suspension 30 mL, 30 mL, Oral, Q6H PRN, Mansy, Jan A, MD .  amLODipine (NORVASC) tablet 5 mg, 5 mg, Oral, Daily, Mansy, Jan A, MD, 5 mg at 12/19/19 0843 .  aspirin chewable tablet 81 mg, 81 mg, Oral, Daily, Mansy, Jan A, MD, 81 mg at 12/19/19 0842 .  azithromycin (ZITHROMAX) tablet 500 mg, 500 mg, Oral, Daily, Danford, Suann Larry, MD, 500 mg at 12/19/19 0843 .  citalopram (CELEXA) tablet 20 mg, 20 mg, Oral, Daily, Mansy, Jan A, MD, 20 mg at 12/19/19 0843 .  clopidogrel (PLAVIX) tablet 75 mg, 75 mg, Oral, Daily, Mansy, Jan A, MD, 75 mg at 12/19/19 0843 .  enoxaparin (LOVENOX) injection 40 mg, 40 mg, Subcutaneous, Q24H, Mansy, Jan A, MD, 40 mg at 12/19/19 0843 .  gabapentin (NEURONTIN) capsule 100 mg, 100 mg, Oral, BID, Mansy, Jan  A, MD, 100 mg at 12/19/19 0842 .  levalbuterol (XOPENEX) nebulizer solution 0.63 mg, 0.63 mg, Nebulization, Q6H PRN, Danford, Suann Larry, MD .  levothyroxine (SYNTHROID) tablet 37.5 mcg, 37.5 mcg, Oral, Q0600, Mansy, Jan A, MD, 37.5 mcg at 12/19/19 0504 .  magnesium hydroxide (MILK OF MAGNESIA) suspension 30 mL, 30 mL, Oral, Daily PRN, Mansy, Jan A, MD .  metoprolol succinate (TOPROL-XL) 24 hr tablet 25 mg, 25 mg, Oral, Daily, Danford, Suann Larry, MD, 25 mg at 12/19/19 0842 .  multivitamin-lutein (OCUVITE-LUTEIN) capsule 1 capsule, 1 capsule, Oral, BID, Mansy, Jan A, MD, 1 capsule at 12/19/19 0843 .  ondansetron (ZOFRAN) tablet 4 mg, 4 mg, Oral, Q6H PRN **OR** ondansetron (ZOFRAN) injection 4 mg, 4 mg, Intravenous, Q6H PRN, Mansy, Jan A, MD .  pravastatin (PRAVACHOL) tablet 40 mg, 40 mg, Oral, QHS, Mansy, Jan A, MD, 40 mg at 12/18/19 2028 .  predniSONE (DELTASONE) tablet 50 mg, 50 mg, Oral, Q breakfast, Yentl Verge, MD, 50 mg at 12/19/19 0843 .  sodium chloride flush (NS) 0.9 % injection 3 mL, 3 mL, Intravenous, Once, Duffy Bruce, MD .  sodium chloride flush (NS) 0.9 % injection 3 mL, 3 mL, Intravenous, Q12H, Mansy, Jan A, MD, 3 mL at 12/19/19 0844 .  traMADol (ULTRAM) tablet 50 mg, 50 mg, Oral, Q6H PRN, Mansy, Jan A, MD, 50 mg at 12/19/19 0355    ALLERGIES   Flexeril  [cyclobenzaprine hcl], Reglan [metoclopramide], Relafen [nabumetone], Risperdal  [risperidone], Sulfa antibiotics, Tape, Triamterene-hctz, and Hctz [hydrochlorothiazide]     REVIEW OF SYSTEMS    Review of Systems:  Gen:  Denies  fever, sweats, chills weigh loss  HEENT: Denies blurred vision, double vision, ear pain, eye pain, hearing loss, nose bleeds, sore throat Cardiac:  No dizziness, chest pain or heaviness, chest tightness,edema Resp:   Denies cough or sputum porduction, shortness of breath,wheezing, hemoptysis,  Gi: Denies swallowing difficulty, stomach pain, nausea or vomiting, diarrhea,  constipation, bowel incontinence Gu:  Denies bladder incontinence, burning urine Ext:   Denies Joint pain, stiffness or swelling Skin: Denies  skin rash, easy bruising or bleeding or hives Endoc:  Denies polyuria, polydipsia , polyphagia or weight change Psych:   Denies depression, insomnia or hallucinations   Other:  All other systems negative   VS: BP (!) 143/52   Pulse 100   Temp 98 F (36.7 C) (Oral)   Resp 20   Ht 5\' 1"  (1.549 m)   Wt 77.1 kg   SpO2 94%   BMI 32.12 kg/m  PHYSICAL EXAM    GENERAL:NAD, no fevers, chills, no weakness no fatigue HEAD: Normocephalic, atraumatic.  EYES: Pupils equal, round, reactive to light. Extraocular muscles intact. No scleral icterus.  MOUTH: Moist mucosal membrane. Dentition intact. No abscess noted.  EAR, NOSE, THROAT: Clear without exudates. No external lesions.  NECK: Supple. No thyromegaly. No nodules. No JVD.  PULMONARY: Mildly ronchorous breath sounds.  CARDIOVASCULAR: S1 and S2. Regular rate and rhythm. No murmurs, rubs, or gallops. No edema. Pedal pulses 2+ bilaterally.  GASTROINTESTINAL: Soft, nontender, nondistended. No masses. Positive bowel sounds. No hepatosplenomegaly.  MUSCULOSKELETAL: No swelling, clubbing, or edema. Range of motion full in all extremities.  NEUROLOGIC: Cranial nerves II through XII are intact. No gross focal neurological deficits. Sensation intact. Reflexes intact.  SKIN: No ulceration, lesions, rashes, or cyanosis. Skin warm and dry. Turgor intact.  PSYCHIATRIC: Mood, affect within normal limits. The patient is awake, alert and oriented x 3. Insight, judgment intact.       IMAGING    CT ANGIO HEAD W OR WO CONTRAST  Result Date: 12/02/2019 CLINICAL DATA:  Carotid artery stenosis. Recent slurred speech and mental status changes. Basilar tip aneurysm. Left carotid stenosis by ultrasound. EXAM: CT ANGIOGRAPHY HEAD AND NECK TECHNIQUE: Multidetector CT imaging of the head and neck was performed  using the standard protocol during bolus administration of intravenous contrast. Multiplanar CT image reconstructions and MIPs were obtained to evaluate the vascular anatomy. Carotid stenosis measurements (when applicable) are obtained utilizing NASCET criteria, using the distal internal carotid diameter as the denominator. CONTRAST:  20mL OMNIPAQUE IOHEXOL 350 MG/ML SOLN COMPARISON:  MRI same day. Ultrasound same day. MRI 08/26/2013. CT angiography 08/19/2013. FINDINGS: CT HEAD FINDINGS Brain: Brain atrophy with chronic small-vessel ischemic changes affecting the pons, thalami, basal ganglia and hemispheric white matter. No sign of acute infarction, mass lesion, hemorrhage, hydrocephalus or extra-axial collection. Vascular: There is atherosclerotic calcification of the major vessels at the base of the brain. 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 See below. Skull: Negative Sinuses: Clear sinuses. Orbits: Lens calcification on the right. Review of the MIP images confirms the above findings CTA NECK FINDINGS Aortic arch: Aortic atherosclerotic calcification. No aneurysm or dissection. Branching pattern is normal without flow limiting stenosis. Right carotid system: Common carotid artery widely patent to the bifurcation and ICA bulb. Somewhat irregular soft and calcified plaque affecting the ICA bulb with minimal diameter of 3.4 mm. Compared to a more distal cervical ICA diameter of 5 mm, this indicates a 30% stenosis. Vessels are tortuous, swinging to the midline behind the oropharynx. Left carotid system: Common carotid artery is patent to the bifurcation. Severe irregular calcified plaque at the carotid bifurcation and ICA bulb. Luminal stenosis likely 1 mm. Compared to a more distal cervical ICA diameter of 5 mm, this indicates an 80% stenosis. Vessels are tortuous, swinging to the midline behind the oropharynx. Vertebral arteries: Calcified plaque at both vertebral artery origin regions but without stenosis greater  than 30%. Scattered areas of calcified plaque through the cervical course of both vertebral arteries but no stenosis greater than 30%. Skeleton: Degenerative cervical spondylosis. Other neck: No mass or lymphadenopathy. Upper chest: Emphysema and pulmonary scarring at the apices. Review of the MIP images confirms the above findings CTA HEAD FINDINGS Anterior circulation: Both internal carotid arteries are patent through the skull base and siphon regions. Ordinary siphon atherosclerotic calcification but without stenosis greater than 30%. Anterior and middle cerebral vessels are patent without large or medium vessel occlusion, aneurysm or vascular malformation. Posterior  circulation: Both vertebral arteries are patent through the foramen magnum. There is atherosclerotic disease at both vertebral artery V4 segments with stenosis estimated at 50% on both sides. Both vessels supply the basilar. No basilar stenosis. 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 Venous sinuses: Patent and normal. Anatomic variants: None significant. Review of the MIP images confirms the above findings IMPRESSION: No acute large or medium vessel occlusion. Aortic Atherosclerosis (ICD10-I70.0) and Emphysema (ICD10-J43.9). Atherosclerotic disease at both carotid bifurcation and ICA bulb regions. 30% ICA stenosis on the right. 80% or greater irregular stenosis of the ICA bulb on the left. Vessels are tortuous, swinging to the midline behind the oropharynx. Atherosclerotic disease of both vertebral artery V4 segments with stenoses estimated at 50%. 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 Electronically Signed   By: Nelson Chimes M.D.   On: 12/02/2019 15:56   DG Chest 2 View  Result Date: 12/19/2019 CLINICAL DATA:  Cough, COPD, hypertension. EXAM: CHEST - 2 VIEW COMPARISON:  Chest x-rays dated 12/05/2019 and 12/02/2019.  FINDINGS: Stable cardiomegaly. Lungs are clear. No pleural effusion or pneumothorax is seen. Probable hiatal hernia. No acute appearing osseous abnormality. IMPRESSION: 1. No active cardiopulmonary disease. No evidence of pneumonia or pulmonary edema. 2. Stable cardiomegaly. 3. Probable hiatal hernia. Electronically Signed   By: Franki Cabot M.D.   On: 12/19/2019 13:13   CT Head Wo Contrast  Result Date: 12/16/2019 CLINICAL DATA:  Near syncopal episode today. EXAM: CT HEAD WITHOUT CONTRAST TECHNIQUE: Contiguous axial images were obtained from the base of the skull through the vertex without intravenous contrast. COMPARISON:  Brain MRI 12/02/2019.  Head CT scan 12/01/2019. FINDINGS: Brain: No evidence of acute infarction, hemorrhage, hydrocephalus, extra-axial collection or mass lesion/mass effect. Extensive chronic microvascular ischemic change again seen. Lacunar infarctions in the basal ganglia and thalami also again seen. Vascular: No hyperdense vessel or unexpected calcification. Skull: Intact.  No focal lesion. Sinuses/Orbits: Negative. Other: None. IMPRESSION: No acute abnormality. Extensive chronic microvascular ischemic disease. Electronically Signed   By: Inge Rise M.D.   On: 12/16/2019 21:40   CT HEAD WO CONTRAST  Result Date: 12/01/2019 CLINICAL DATA:  Slurred speech x2 days. EXAM: CT HEAD WITHOUT CONTRAST TECHNIQUE: Contiguous axial images were obtained from the base of the skull through the vertex without intravenous contrast. COMPARISON:  Dec 19, 2017 FINDINGS: Brain: There is mild cerebral atrophy with widening of the extra-axial spaces and ventricular dilatation. There are areas of decreased attenuation within the white matter tracts of the supratentorial brain, consistent with microvascular disease changes. Small chronic bilateral basal ganglia lacunar infarcts are seen. Vascular: Stable, approximately 5.3 mm diameter partially calcified, aneurysmal dilatation of the tip of the  basilar artery is seen. Skull: Normal. Negative for fracture or focal lesion. Sinuses/Orbits: No acute finding. Other: None. IMPRESSION: 1. Generalized cerebral atrophy. 2. No acute intracranial abnormality. 3. 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 4. Small chronic bilateral basal ganglia lacunar infarcts. Electronically Signed   By: Virgina Norfolk M.D.   On: 12/01/2019 20:16   CT ANGIO NECK W OR WO CONTRAST  Result Date: 12/02/2019 CLINICAL DATA:  Carotid artery stenosis. Recent slurred speech and mental status changes. Basilar tip aneurysm. Left carotid stenosis by ultrasound. EXAM: CT ANGIOGRAPHY HEAD AND NECK TECHNIQUE: Multidetector CT imaging of the head and  neck was performed using the standard protocol during bolus administration of intravenous contrast. Multiplanar CT image reconstructions and MIPs were obtained to evaluate the vascular anatomy. Carotid stenosis measurements (when applicable) are obtained utilizing NASCET criteria, using the distal internal carotid diameter as the denominator. CONTRAST:  62mL OMNIPAQUE IOHEXOL 350 MG/ML SOLN COMPARISON:  MRI same day. Ultrasound same day. MRI 08/26/2013. CT angiography 08/19/2013. FINDINGS: CT HEAD FINDINGS Brain: Brain atrophy with chronic small-vessel ischemic changes affecting the pons, thalami, basal ganglia and hemispheric white matter. No sign of acute infarction, mass lesion, hemorrhage, hydrocephalus or extra-axial collection. Vascular: There is atherosclerotic calcification of the major vessels at the base of the brain. 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 See below. Skull: Negative Sinuses: Clear sinuses. Orbits: Lens calcification on the right. Review of the MIP images confirms the above findings CTA NECK FINDINGS Aortic arch: Aortic atherosclerotic calcification. No aneurysm or dissection. Branching pattern is normal without flow limiting stenosis. Right carotid  system: Common carotid artery widely patent to the bifurcation and ICA bulb. Somewhat irregular soft and calcified plaque affecting the ICA bulb with minimal diameter of 3.4 mm. Compared to a more distal cervical ICA diameter of 5 mm, this indicates a 30% stenosis. Vessels are tortuous, swinging to the midline behind the oropharynx. Left carotid system: Common carotid artery is patent to the bifurcation. Severe irregular calcified plaque at the carotid bifurcation and ICA bulb. Luminal stenosis likely 1 mm. Compared to a more distal cervical ICA diameter of 5 mm, this indicates an 80% stenosis. Vessels are tortuous, swinging to the midline behind the oropharynx. Vertebral arteries: Calcified plaque at both vertebral artery origin regions but without stenosis greater than 30%. Scattered areas of calcified plaque through the cervical course of both vertebral arteries but no stenosis greater than 30%. Skeleton: Degenerative cervical spondylosis. Other neck: No mass or lymphadenopathy. Upper chest: Emphysema and pulmonary scarring at the apices. Review of the MIP images confirms the above findings CTA HEAD FINDINGS Anterior circulation: Both internal carotid arteries are patent through the skull base and siphon regions. Ordinary siphon atherosclerotic calcification but without stenosis greater than 30%. Anterior and middle cerebral vessels are patent without large or medium vessel occlusion, aneurysm or vascular malformation. Posterior circulation: Both vertebral arteries are patent through the foramen magnum. There is atherosclerotic disease at both vertebral artery V4 segments with stenosis estimated at 50% on both sides. Both vessels supply the basilar. No basilar stenosis. 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 Venous sinuses: Patent and normal. Anatomic variants: None significant. Review of the MIP images confirms the above findings IMPRESSION: No acute large or  medium vessel occlusion. Aortic Atherosclerosis (ICD10-I70.0) and Emphysema (ICD10-J43.9). Atherosclerotic disease at both carotid bifurcation and ICA bulb regions. 30% ICA stenosis on the right. 80% or greater irregular stenosis of the ICA bulb on the left. Vessels are tortuous, swinging to the midline behind the oropharynx. Atherosclerotic disease of both vertebral artery V4 segments with stenoses estimated at 50%. 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 Electronically Signed   By: Nelson Chimes M.D.   On: 12/02/2019 15:56   CT Angio Chest PE W and/or Wo Contrast  Result Date: 12/16/2019 CLINICAL DATA:  Shortness of breath, near syncope EXAM: CT ANGIOGRAPHY CHEST WITH CONTRAST TECHNIQUE: Multidetector CT imaging of the chest was performed using the standard protocol during bolus administration of intravenous contrast.  Multiplanar CT image reconstructions and MIPs were obtained to evaluate the vascular anatomy. CONTRAST:  63mL OMNIPAQUE IOHEXOL 350 MG/ML SOLN COMPARISON:  06/30/2011, 12/05/2019 FINDINGS: Cardiovascular: This is a technically adequate evaluation of the pulmonary vasculature. No filling defects or pulmonary emboli. The heart is normal without pericardial effusion. There is extensive atherosclerosis of the thoracic aorta, with no aneurysm or dissection. Significant atherosclerosis throughout the coronary vasculature greatest in the LAD distribution. Mediastinum/Nodes: No enlarged mediastinal, hilar, or axillary lymph nodes. Thyroid gland, trachea, and esophagus demonstrate no significant findings. Small hiatal hernia is noted. Lungs/Pleura: Upper lobe predominant emphysema is seen, with scattered areas of air trapping. No airspace disease, effusion, or pneumothorax. Central airways are patent. Upper Abdomen: No acute abnormality. Musculoskeletal: No acute or destructive bony lesions. Reconstructed images demonstrate no  additional findings. Review of the MIP images confirms the above findings. IMPRESSION: 1. No CT evidence of pulmonary embolism. 2. Small hiatal hernia. 3. Aortic Atherosclerosis (ICD10-I70.0) and Emphysema (ICD10-J43.9). Electronically Signed   By: Randa Ngo M.D.   On: 12/16/2019 21:40   MR BRAIN WO CONTRAST  Result Date: 12/02/2019 CLINICAL DATA:  Neuro deficit, acute, stroke suspected. Additional history provided: Acute onset slurred speech, headache, mild nausea without vomiting EXAM: MRI HEAD WITHOUT CONTRAST TECHNIQUE: Multiplanar, multiecho pulse sequences of the brain and surrounding structures were obtained without intravenous contrast. COMPARISON:  Noncontrast head CT 12/01/2019, noncontrast head CT 12/19/2017, brain MRI 08/26/2013, CT angiogram head 08/19/2013 FINDINGS: Brain: Multiple sequences are significantly motion degraded. Most notably there is moderate motion degradation of the axial and coronal diffusion-weighted sequence, moderate motion degradation of the axial SWI sequence, moderate motion degradation of the axial T1 weighted sequence, moderate/severe motion degradation of the axial T2 FLAIR sequence and moderate/severe motion degradation of the coronal T2 weighted sequence. Moderate to advanced patchy and confluent T2/FLAIR hyperintensity within the cerebral white matter is nonspecific, but consistent with chronic small vessel ischemic disease. Findings have progressed as compared to prior MRI 08/26/2013. Redemonstrated chronic lacunar infarcts within the bilateral basal ganglia. Also again seen, there are chronic small-vessel ischemic changes within the thalami and pons. Chronic microhemorrhages within the right frontal lobe, left periatrial region and left cerebellum not definitively present on a prior MRI. Stable, mild generalized parenchymal atrophy. There is no acute infarct. No evidence of intracranial mass. No extra-axial fluid collection. No midline shift. Vascular: Expected  proximal arterial flow voids. 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 Skull and upper cervical spine: No focal marrow lesion Sinuses/Orbits: Visualized orbits show no acute finding. Mild ethmoid and right maxillary sinus mucosal thickening. No significant mastoid effusion. IMPRESSION: 1. Motion degraded examination as described. 2. No evidence of acute intracranial abnormality, including acute infarction. 3. Moderate to advanced chronic small vessel ischemic disease has progressed as compared to prior MRI 08/26/2013. Redemonstrated chronic lacunar infarcts within the bilateral basal ganglia. 4. Nonspecific chronic microhemorrhages within the right frontal lobe, left periatrial region and left cerebellum which were not definitively present on the prior MRI. 5. Stable, mild generalized parenchymal atrophy. 6. 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 Electronically Signed   By: Kellie Simmering DO   On: 12/02/2019 08:26   US Carotid Bilateral (at [facility] and [facility] only)  Result Date: 12/02/2019 CLINICAL DATA:  84 year old female with TIA EXAM: BILATERAL CAROTID DUPLEX ULTRASOUND TECHNIQUE: Pearline Cables scale imaging, color Doppler and  duplex ultrasound were performed of bilateral carotid and vertebral arteries in the neck. COMPARISON:  None. FINDINGS: Criteria: Quantification of carotid stenosis is based on velocity parameters that correlate the residual internal carotid diameter with NASCET-based stenosis levels, using the diameter of the distal internal carotid lumen as the denominator for stenosis measurement. The following velocity measurements were obtained: RIGHT ICA:  Systolic 99991111 cm/sec, Diastolic 21 cm/sec CCA:  69 cm/sec SYSTOLIC ICA/CCA RATIO:  2.0 ECA:  120 cm/sec LEFT ICA:  Systolic 0000000 cm/sec, Diastolic 21 cm/sec CCA:  75 cm/sec SYSTOLIC ICA/CCA RATIO:  3.8 ECA:  1 4  cm/sec Right Brachial SBP: Not acquired Left Brachial SBP: Not acquired RIGHT CAROTID ARTERY: No significant calcifications of the right common carotid artery. Intermediate waveform maintained. Moderate heterogeneous and partially calcified plaque at the right carotid bifurcation. No significant lumen shadowing. Low resistance waveform of the right ICA. Tortuosity RIGHT VERTEBRAL ARTERY: Antegrade flow with low resistance waveform. LEFT CAROTID ARTERY: No significant calcifications of the left common carotid artery. Intermediate waveform maintained. Moderate heterogeneous and partially calcified plaque at the left carotid bifurcation. No significant lumen shadowing. Low resistance waveform of the left ICA. Tortuosity LEFT VERTEBRAL ARTERY:  Antegrade flow with low resistance waveform. IMPRESSION: Right: Color duplex indicates moderate heterogeneous and calcified plaque, with no hemodynamically significant stenosis by duplex criteria in the extracranial cerebrovascular circulation. Left: Heterogeneous and partially calcified plaque at the left carotid bifurcation, with discordant results regarding degree of stenosis by established duplex criteria. Peak velocity suggests 70% - 99% stenosis, with the ICA/ CCA ratio suggesting a lesser degree of stenosis. If establishing a more accurate degree of stenosis is required, cerebral angiogram should be considered, or as a second best test, CTA. Signed, Dulcy Fanny. Dellia Nims, RPVI [facility] Radiology Electronically Signed   By: Corrie Mckusick D.O.   On: 12/02/2019 11:33   PERIPHERAL VASCULAR CATHETERIZATION  Result Date: 12/04/2019 See op note  DG Chest Port 1 View  Result Date: 12/05/2019 CLINICAL DATA:  Shortness of breath. EXAM: PORTABLE CHEST 1 VIEW COMPARISON:  12/02/2019 FINDINGS: Heart size remains enlarged. Calcification of the thoracic aorta is similar to the prior study. No consolidation or sign of pleural  effusion. Slight increase in density along the paraspinal region likely reflects hiatal hernia. Spinal degenerative changes. No acute or destructive bone process. IMPRESSION: 1. No active cardiopulmonary disease. 2. Stable cardiomegaly and aortic atherosclerosis. Electronically Signed   By: Zetta Bills M.D.   On: 12/05/2019 10:42   DG Chest Portable 1 View  Result Date: 12/02/2019 CLINICAL DATA:  Slurred speech x2 days. EXAM: PORTABLE CHEST 1 VIEW COMPARISON:  January 26, 2014 FINDINGS: Mild diffuse chronic appearing increased lung markings are seen. There is no evidence of acute infiltrate, pleural effusion or pneumothorax. The cardiac silhouette is moderately enlarged. There is marked severity calcification of the aortic arch. Multilevel degenerative changes seen throughout the thoracic spine. IMPRESSION: Chronic-appearing increased lung markings without evidence of acute or active cardiopulmonary disease. Electronically Signed   By: Virgina Norfolk M.D.   On: 12/02/2019 00:33   ECHOCARDIOGRAM COMPLETE  Result Date: 12/03/2019    ECHOCARDIOGRAM REPORT   Patient Name:   Toni White Date of Exam: 12/02/2019 Medical Rec #:  [id number]     Height:       61.0 in Accession #:    [id number]    Weight:       169.0 lb Date of Birth:  04-29-1936  BSA:          1.758 m Patient Age:    62 years      BP:           156/69 mmHg Patient Gender: F             HR:           70 bpm. Exam Location:  [facility] Procedure: 2D Echo, Cardiac Doppler and Color Doppler Indications:     TIA 435.9  History:         Patient has no prior history of Echocardiogram examinations.                  COPD; Risk Factors:Hypertension.  Sonographer:     Sherrie Sport RDCS (AE) Referring Phys:  [id number] Arvella Merles MANSY Diagnosing Phys: Yolonda Kida MD IMPRESSIONS  1. Left ventricular ejection fraction, by estimation, is 65 to 70%. The left ventricle has normal function. The left ventricle has no regional wall motion abnormalities. There is mild  concentric left ventricular hypertrophy. Left ventricular diastolic parameters are consistent with Grade I diastolic dysfunction (impaired relaxation).  2. Right ventricular systolic function is moderately reduced. The right ventricular size is moderately enlarged. Mildly increased right ventricular wall thickness. There is normal pulmonary artery systolic pressure.  3. The mitral valve is normal in structure. No evidence of mitral valve regurgitation.  4. The aortic valve is grossly normal. Aortic valve regurgitation is not visualized. FINDINGS  Left Ventricle: Left ventricular ejection fraction, by estimation, is 65 to 70%. The left ventricle has normal function. The left ventricle has no regional wall motion abnormalities. The left ventricular internal cavity size was normal in size. There is  mild concentric left ventricular hypertrophy. Left ventricular diastolic parameters are consistent with Grade I diastolic dysfunction (impaired relaxation). Right Ventricle: The right ventricular size is moderately enlarged. Mildly increased right ventricular wall thickness. Right ventricular systolic function is moderately reduced. There is normal pulmonary artery systolic pressure. The tricuspid regurgitant velocity is 1.81 m/s, and with an assumed right atrial pressure of 10 mmHg, the estimated right ventricular systolic pressure is [id number] mmHg. Left Atrium: Left atrial size was normal in size. Right Atrium: Right atrial size was normal in size. Pericardium: Trivial pericardial effusion is present. Mitral Valve: The mitral valve is normal in structure. No evidence of mitral valve regurgitation. Tricuspid Valve: The tricuspid valve is normal in structure. Tricuspid valve regurgitation is trivial. Aortic Valve: The aortic valve is grossly normal. Aortic valve regurgitation is not visualized. Aortic valve mean gradient measures 3.0 mmHg. Aortic valve peak gradient measures 5.5 mmHg. Aortic valve area, by VTI measures 3.64  cm. Pulmonic Valve: The pulmonic valve was grossly normal. Pulmonic valve regurgitation is not visualized. Aorta: The aortic root is normal in size and structure. IAS/Shunts: No atrial level shunt detected by color flow Doppler.  LEFT VENTRICLE PLAX 2D LVIDd:         3.50 cm  Diastology LVIDs:         2.14 cm  LV e' lateral:   6.42 cm/s LV PW:         0.91 cm  LV E/e' lateral: 16.2 LV IVS:        1.21 cm  LV e' medial:    6.31 cm/s LVOT diam:     2.00 cm  LV E/e' medial:  16.5 LV SV:         82 LV SV Index:   47 LVOT Area:  3.14 cm  RIGHT VENTRICLE RV Basal diam:  2.26 cm LEFT ATRIUM             Index       RIGHT ATRIUM           Index LA diam:        3.40 cm 1.93 cm/m  RA Area:     11.70 cm LA Vol (A2C):   33.6 ml 19.11 ml/m RA Volume:   23.20 ml  13.19 ml/m LA Vol (A4C):   33.9 ml 19.28 ml/m LA Biplane Vol: 33.6 ml 19.11 ml/m  AORTIC VALVE                   PULMONIC VALVE AV Area (Vmax):    3.11 cm    PV Vmax:       0.70 m/s AV Area (Vmean):   3.72 cm    PV Peak grad:  2.0 mmHg AV Area (VTI):     3.64 cm AV Vmax:           117.00 cm/s AV Vmean:          79.400 cm/s AV VTI:            0.225 m AV Peak Grad:      5.5 mmHg AV Mean Grad:      3.0 mmHg LVOT Vmax:         116.00 cm/s LVOT Vmean:        93.900 cm/s LVOT VTI:          0.261 m LVOT/AV VTI ratio: 1.16  AORTA Ao Root diam: 3.10 cm MITRAL VALVE                TRICUSPID VALVE MV Area (PHT): 2.18 cm     TR Peak grad:   13.1 mmHg MV Decel Time: 348 msec     TR Vmax:        181.00 cm/s MV E velocity: 104.00 cm/s MV A velocity: 135.00 cm/s  SHUNTS MV E/A ratio:  0.77         Systemic VTI:  0.26 m                             Systemic Diam: 2.00 cm Dwayne Prince Rome MD Electronically signed by Yolonda Kida MD Signature Date/Time: 12/03/2019/6:39:32 AM    Final       ASSESSMENT/PLAN   Moderate Acute exacerbation of COPD  -agree with DUoneb and solumedrol 40 BID-changed to prednisone 40 po daily - 12/19/19  - d/cd Daliresp 555mcg  daily-12/17/19 - continue zithromax 500mg   po daily  -MetaNEB q4h- decreased frequency to BID - 12/18/19  - adding Mucomyst 52ml 20% BID-Finished 12/18/19  - IS at bedside -continue PT/OT -patient is approaching near baseline in terms of her COPD status.  She may resume Daliresp upon discharge.  I will be available this weekend and will continue to follow if patient remains hospitalized.     Chronic hypoxemic respiratory failure  - patient is on 2L/min [place] and O2 saturation is >95%  - she is on home settings at this time  - recommend PT/OT and early mobilization    Thank you for allowing me to participate in the care of this patient.   Patient/Family are satisfied with care plan and all questions have been answered.  This document was prepared using Dragon voice recognition software and may include unintentional dictation errors.  Ottie Glazier, M.D.  Division of [place]

## 2019-12-19 NOTE — Progress Notes (Signed)
Follow up assessment completed on patient per MEWS protocol. Per provider, repeat EKG ordered. EKG awaiting completion. Also, input order for cardiology consult.     12/19/19 0555  Assess: MEWS Score  Temp 98 F (36.7 C)  BP (!) 167/67  Pulse Rate (!) 131  ECG Heart Rate (!) 128  Resp (!) 21  Level of Consciousness Alert  SpO2 94 %  O2 Device Nasal Cannula  O2 Flow Rate (L/min) 2 L/min  Assess: MEWS Score  MEWS Temp 0  MEWS Systolic 0  MEWS Pulse 2  MEWS RR 1  MEWS LOC 0  MEWS Score 3  MEWS Score Color Yellow  Assess: if the MEWS score is Yellow or Red  Were vital signs taken at a resting state? Yes  Focused Assessment Documented focused assessment  Early Detection of Sepsis Score *See Row Information* Medium  Treat  MEWS Interventions Other (Comment) (Hospitalist paged)  Take Vital Signs  Increase Vital Sign Frequency  Red: Q 1hr X 4 then Q 4hr X 4, if remains red, continue Q 4hrs  Escalate  MEWS: Escalate Red: discuss with charge nurse/RN and provider, consider discussing with RRT  Notify: Provider  Provider Name/Title Rufina Falco, FNP  Date Provider Notified 12/19/19  Time Provider Notified 0600  Notification Type Page (via secure chat)  Notification Reason Other (Comment) (Protocol)  Response Other (Comment) (awaiting response)  Date of Provider Response 12/19/19  Time of Provider Response [phone]

## 2019-12-20 LAB — BASIC METABOLIC PANEL
Anion gap: 8 (ref 5–15)
BUN: 20 mg/dL (ref 8–23)
CO2: 31 mmol/L (ref 22–32)
Calcium: 8.9 mg/dL (ref 8.9–10.3)
Chloride: 106 mmol/L (ref 98–111)
Creatinine, Ser: 0.41 mg/dL — ABNORMAL LOW (ref 0.44–1.00)
GFR calc Af Amer: >60 mL/min (ref >60)
GFR calc non Af Amer: >60 mL/min (ref >60)
Glucose, Bld: 105 mg/dL — ABNORMAL HIGH (ref 70–99)
Potassium: 3.3 mmol/L — ABNORMAL LOW (ref 3.5–5.1)
Sodium: 145 mmol/L (ref 135–145)

## 2019-12-20 LAB — GLUCOSE, CAPILLARY: Glucose-Capillary: 104 mg/dL — ABNORMAL HIGH (ref 70–99)

## 2019-12-20 LAB — CBC
HCT: 38.6 % (ref 36.0–46.0)
Hemoglobin: 12.4 g/dL (ref 12.0–15.0)
MCH: 29.8 pg (ref 26.0–34.0)
MCHC: 32.1 g/dL (ref 30.0–36.0)
MCV: 92.8 fL (ref 80.0–100.0)
Platelets: 392 10*3/uL (ref 150–400)
RBC: 4.16 MIL/uL (ref 3.87–5.11)
RDW: 13.5 % (ref 11.5–15.5)
WBC: 25.6 10*3/uL — ABNORMAL HIGH (ref 4.0–10.5)
nRBC: 0 % (ref 0.0–0.2)

## 2019-12-20 LAB — PROCALCITONIN: Procalcitonin: 0.16 ng/mL

## 2019-12-20 MED ORDER — PREDNISONE 20 MG PO TABS
30.0000 mg | ORAL_TABLET | Freq: Every day | ORAL | Status: DC
  Administered 2019-12-21: 10:00:00 30 mg via ORAL
  Filled 2019-12-20 (×2): qty 1

## 2019-12-20 MED ORDER — SODIUM CHLORIDE 0.9 % IV SOLN
1.0000 g | INTRAVENOUS | Status: DC
  Administered 2019-12-21 – 2019-12-24 (×4): 1 g via INTRAVENOUS
  Filled 2019-12-20 (×2): qty 1
  Filled 2019-12-20: qty 10
  Filled 2019-12-20: qty 1
  Filled 2019-12-20: qty 10

## 2019-12-20 MED ORDER — POTASSIUM CHLORIDE CRYS ER 20 MEQ PO TBCR
40.0000 meq | EXTENDED_RELEASE_TABLET | Freq: Once | ORAL | Status: DC
  Filled 2019-12-20: qty 2

## 2019-12-20 NOTE — Evaluation (Signed)
Physical Therapy Evaluation Patient Details Name: Toni White MRN: [id number] DOB: 14-Oct-1935 Today's Date: 12/20/2019   History of Present Illness  84 y/o F who was here after syncopal episode. She was here ~2 weeks ago with acute onset of slurred speech, HA, nausea; admitted for TIA/CVA work up, MRI negative. PMH:  includes CRF on 2L O2 at all times at home. Pt underwent Left angiograp (vertebral, cerebral), aortagram on 4/29.  Clinical Impression  Pt was here ~4 weeks ago and appears to be more limited now.  It was difficult to get a true idea about her prior level of function as most interaction/questioning devolved into digressive generalities (even with repeated and alternative questioning). When asked, post session, if how she just did would be "normal" for her she did quickly indicate that she normally moves a lot better around the home (she did walk ~90 ft with PT on recent admission).  Pt's HR elevated with modest activity and O2 (on 4L) dropped quickly to the mid 80s with shortness of breath and fatigue.  Overall pt showed good effort but very likely would struggle at home in her current functional and activity tolerance state.  Pt hopes to improve enough to go home before d/c, but did seem willing to go to rehab if she needed.  PT recommendations per today's performance can only be STR/      Follow Up Recommendations SNF    Equipment Recommendations  None recommended by PT    Recommendations for Other Services       Precautions / Restrictions Precautions Precautions: Fall Restrictions Weight Bearing Restrictions: No      Mobility  Bed Mobility Overal bed mobility: Needs Assistance Bed Mobility: Supine to Sit     Supine to sit: Mod assist     General bed mobility comments: Pt was able to show effort and give some assist, but despite cuing and extra time needed direct assist to get to sitting EOB  Transfers Overall transfer level: Needs assistance Equipment used:  Rolling walker (2 wheeled) Transfers: Sit to/from Stand Sit to Stand: Mod assist         General transfer comment: repeated cuing and encouragement, unable to lift hips off surface on her own, needed direct assist and repeated cues to attain standing  Ambulation/Gait Ambulation/Gait assistance: Min assist Gait Distance (Feet): 20 Feet Assistive device: Rolling walker (2 wheeled)       General Gait Details: following with recliner, pt was fatigue during the effort.  HR quickly to the 110s and into high 120s with modest effort, on 4L t/o, sats dropped to mid 80s.  Regarding actual gait she had shuffling cadence, poor awareness with walker and needed almost constant cuing to insure appropraite posture, direction, encourage fuller breaths, etc  Stairs            Wheelchair Mobility    Modified Rankin (Stroke Patients Only)       Balance Overall balance assessment: Needs assistance Sitting-balance support: Single extremity supported;Feet supported(pt able to sit EOB unassisted relatively well) Sitting balance-Leahy Scale: Good     Standing balance support: Bilateral upper extremity supported;During functional activity Standing balance-Leahy Scale: Fair Standing balance comment: reliant on walker, poor awareness                             Pertinent Vitals/Pain Pain Assessment: No/denies pain    Home Living Family/patient expects to be discharged to:: Unsure Living Arrangements: Spouse/significant  other Available Help at Discharge: Family;Friend(s);Available PRN/intermittently(daughter apparently helps as well)   Home Access: Ramped entrance     Home Layout: One level Home Equipment: Walker - 2 wheels      Prior Function Level of Independence: Independent with assistive device(s)         Comments: Mod indep with ADLs and household mobilization; should use RW, but chooses to 'furniture walk' instead.  Does endorse fall history, but unable to fully  quantify     Hand Dominance        Extremity/Trunk Assessment   Upper Extremity Assessment Upper Extremity Assessment: Overall WFL for tasks assessed;Generalized weakness    Lower Extremity Assessment Lower Extremity Assessment: Overall WFL for tasks assessed;Generalized weakness       Communication   Communication: No difficulties(inconsistent ability to converse, minimal verbalizations)  Cognition Arousal/Alertness: Awake/alert Behavior During Therapy: Flat affect Overall Cognitive Status: Difficult to assess                                 General Comments: Pt with consistent delayed or discursive responses, showed general awareness but less so per notes from recent admit      General Comments      Exercises     Assessment/Plan    PT Assessment Patient needs continued PT services  PT Problem List Decreased activity tolerance;Decreased balance;Decreased mobility;Decreased knowledge of use of DME;Decreased safety awareness;Decreased knowledge of precautions       PT Treatment Interventions DME instruction;Gait training;Functional mobility training;Therapeutic activities;Therapeutic exercise;Balance training;Patient/family education    PT Goals (Current goals can be found in the Care Plan section)  Acute Rehab PT Goals Patient Stated Goal: to return home PT Goal Formulation: With patient Time For Goal Achievement: 01/03/20 Potential to Achieve Goals: Fair    Frequency Min 2X/week   Barriers to discharge Decreased caregiver support      Co-evaluation               AM-PAC PT "6 Clicks" Mobility  Outcome Measure Help needed turning from your back to your side while in a flat bed without using bedrails?: A Lot Help needed moving from lying on your back to sitting on the side of a flat bed without using bedrails?: A Lot Help needed moving to and from a bed to a chair (including a wheelchair)?: A Lot Help needed standing up from a chair  using your arms (e.g., wheelchair or bedside chair)?: A Lot Help needed to walk in hospital room?: A Lot Help needed climbing 3-5 steps with a railing? : A Lot 6 Click Score: 12    End of Session Equipment Utilized During Treatment: Gait belt;Oxygen Activity Tolerance: Patient limited by fatigue Patient left: with call bell/phone within reach;with chair alarm set Nurse Communication: Mobility status PT Visit Diagnosis: Muscle weakness (generalized) (M62.81);Difficulty in walking, not elsewhere classified (R26.2)    Time: [id number] PT Time Calculation (min) (ACUTE ONLY): 27 min   Charges:   PT Evaluation $PT Eval Low Complexity: 1 Low PT Treatments $Gait Training: 8-22 mins        Kreg Shropshire, DPT 12/20/2019, 3:11 PM

## 2019-12-20 NOTE — Progress Notes (Signed)
PT Cancellation Note  Patient Details Name: Toni White MRN: [id number] DOB: 09-30-1935   Cancelled Treatment:    Reason Eval/Treat Not Completed: Medical issues which prohibited therapy(Chart review shows HypoK: 2.9 on 5/14, noted tachycardia issues yesterday. No updated K+ at this time. Will hold PT evaluation until later date/time once pt is more medically appropriate.)  7:12 AM, 12/20/19 Etta Grandchild, PT, DPT Physical Therapist - [facility]  [phone] ([place])    [place] 12/20/2019, 7:11 AM

## 2019-12-20 NOTE — Progress Notes (Signed)
Pulmonary Medicine          Date: 12/20/2019,   MRN# [id number] Toni White [birth year]     AdmissionWeight: 77.1 kg                 CurrentWeight: 71.3 kg  Referring physician: Dr Loleta Books    CHIEF COMPLAINT:   Worsening shortness of breath at rest.    SUBJECTIVE   Patient resting in bed.  She reports good sleep overnight without respiratory issues. She was able to do incentive spiro with me at bedside, takes tidal volumes up to 500cc.   Her oxygen requirement has increased from 2 to 4L/min.  CXR yesterday with no new findings.  This is likely due to atelectasis as patient has very shallow breathing from physical deconditioning post CVA.  We will increase Metaneb therapy back up to QID.    PAST MEDICAL HISTORY   Past Medical History:  Diagnosis Date  . COPD (chronic obstructive pulmonary disease) ([place])   . DNR (do not resuscitate) 11/2019   DNR/DNI, confirmed in person by patient and her daughter/POA  . History of chicken pox   . Hyperlipidemia   . Hypertension   . Ulcer      SURGICAL HISTORY   Past Surgical History:  Procedure Laterality Date  . ABDOMINAL HYSTERECTOMY  2003   with BSO due to bleeding  . APPENDECTOMY    . BREAST BIOPSY Left 2005   stereo breast biopsy benign  . BREAST BIOPSY Right 2005   core bx. benign  . BREAST BIOPSY Left 2000?   benign  . BREAST CYST EXCISION Left 1990   incision and draniage of abscess  . CAROTID PTA/STENT INTERVENTION Left 12/04/2019   Procedure: CAROTID PTA/STENT INTERVENTION;  Surgeon: Katha Cabal, MD;  Location: [facility];  Service: Cardiovascular;  Laterality: Left;  . CHOLECYSTECTOMY    . COLONOSCOPY  2003   Dr. Vira Agar. Hyperplastic polyps; Single polyp excision from rectum  . CT Scan of Brain  08/20/2013   Mild diffuse cortical atrophy. Mid chronic ischemic white matter disease. 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 [place]  . INCISION  AND DRAINAGE BREAST ABSCESS Left 1990  . TONSILLECTOMY  1952  . UPPER GI ENDOSCOPY  02/26/2002   Dr. Tiffany Kocher; Gastritis. Single gastric ectasia     FAMILY HISTORY   Family History  Problem Relation Age of Onset  . Heart disease Father   . Congestive Heart Failure Father   . Heart disease Mother   . Congestive Heart Failure Mother   . Cancer Brother        lung  . Lung cancer Brother   . Diabetes Daughter   . Diabetes Daughter   . Breast cancer Neg Hx      SOCIAL HISTORY   Social History   Tobacco Use  . Smoking status: Former Smoker    Packs/day: 1.00    Years: 30.00    Pack years: 30.00    Types: Cigarettes  . Smokeless tobacco: Never Used  . Tobacco comment: quit around 1989  Substance Use Topics  . Alcohol use: No  . Drug use: No     MEDICATIONS    Home Medication:    Current Medication:  Current Facility-Administered Medications:  .  acetaminophen (TYLENOL) tablet 650 mg, 650 mg, Oral, Q6H PRN **OR** acetaminophen (TYLENOL) suppository 650 mg, 650 mg, Rectal, Q6H PRN, Mansy, Arvella Merles, MD .  alum & mag hydroxide-simeth (MAALOX/MYLANTA) 200-200-20 MG/5ML suspension 30 mL, 30 mL, Oral, Q6H PRN, Mansy, Jan A, MD .  amLODipine (NORVASC) tablet 5 mg, 5 mg, Oral, Daily, Mansy, Jan A, MD, 5 mg at 12/20/19 1023 .  aspirin chewable tablet 81 mg, 81 mg, Oral, Daily, Mansy, Jan A, MD, 81 mg at 12/20/19 1023 .  azithromycin (ZITHROMAX) tablet 500 mg, 500 mg, Oral, Daily, Danford, Suann Larry, MD, 500 mg at 12/20/19 1023 .  citalopram (CELEXA) tablet 20 mg, 20 mg, Oral, Daily, Mansy, Jan A, MD, 20 mg at 12/20/19 1023 .  clopidogrel (PLAVIX) tablet 75 mg, 75 mg, Oral, Daily, Mansy, Jan A, MD, 75 mg at 12/20/19 1023 .  enoxaparin (LOVENOX) injection 40 mg, 40 mg, Subcutaneous, Q24H, Mansy, Jan A, MD, 40 mg at 12/20/19 1023 .  gabapentin (NEURONTIN) capsule 100 mg, 100 mg, Oral, BID, Mansy, Jan A, MD, 100 mg at 12/20/19 1023 .  levalbuterol (XOPENEX) nebulizer solution  0.63 mg, 0.63 mg, Nebulization, Q6H PRN, Danford, Suann Larry, MD .  levothyroxine (SYNTHROID) tablet 37.5 mcg, 37.5 mcg, Oral, Q0600, Mansy, Jan A, MD, 37.5 mcg at 12/20/19 0552 .  magnesium hydroxide (MILK OF MAGNESIA) suspension 30 mL, 30 mL, Oral, Daily PRN, Mansy, Jan A, MD .  metoprolol succinate (TOPROL-XL) 24 hr tablet 25 mg, 25 mg, Oral, Daily, Danford, Suann Larry, MD, 25 mg at 12/20/19 1023 .  multivitamin-lutein (OCUVITE-LUTEIN) capsule 1 capsule, 1 capsule, Oral, BID, Mansy, Jan A, MD, 1 capsule at 12/20/19 1023 .  ondansetron (ZOFRAN) tablet 4 mg, 4 mg, Oral, Q6H PRN **OR** ondansetron (ZOFRAN) injection 4 mg, 4 mg, Intravenous, Q6H PRN, Mansy, Jan A, MD .  pravastatin (PRAVACHOL) tablet 40 mg, 40 mg, Oral, QHS, Mansy, Jan A, MD, 40 mg at 12/19/19 2112 .  predniSONE (DELTASONE) tablet 50 mg, 50 mg, Oral, Q breakfast, Purity Irmen, MD, 50 mg at 12/20/19 1022 .  sodium chloride flush (NS) 0.9 % injection 3 mL, 3 mL, Intravenous, Once, Duffy Bruce, MD .  sodium chloride flush (NS) 0.9 % injection 3 mL, 3 mL, Intravenous, Q12H, Mansy, Jan A, MD, 3 mL at 12/20/19 1024 .  traMADol (ULTRAM) tablet 50 mg, 50 mg, Oral, Q6H PRN, Mansy, Jan A, MD, 50 mg at 12/19/19 0355    ALLERGIES   Flexeril  [cyclobenzaprine hcl], Reglan [metoclopramide], Relafen [nabumetone], Risperdal  [risperidone], Sulfa antibiotics, Tape, Triamterene-hctz, and Hctz [hydrochlorothiazide]     REVIEW OF SYSTEMS    Review of Systems:  Gen:  Denies  fever, sweats, chills weigh loss  HEENT: Denies blurred vision, double vision, ear pain, eye pain, hearing loss, nose bleeds, sore throat Cardiac:  No dizziness, chest pain or heaviness, chest tightness,edema Resp:   Denies cough or sputum porduction, shortness of breath,wheezing, hemoptysis,  Gi: Denies swallowing difficulty, stomach pain, nausea or vomiting, diarrhea, constipation, bowel incontinence Gu:  Denies bladder incontinence, burning urine Ext:    Denies Joint pain, stiffness or swelling Skin: Denies  skin rash, easy bruising or bleeding or hives Endoc:  Denies polyuria, polydipsia , polyphagia or weight change Psych:   Denies depression, insomnia or hallucinations   Other:  All other systems negative   VS: BP (!) 148/76 (BP Location: Right Arm)   Pulse 98   Temp 98.2 F (36.8 C) (Oral)   Resp 19   Ht 5\' 1"  (1.549 m)   Wt 71.3 kg   SpO2 98%   BMI 29.68 kg/m      PHYSICAL EXAM  GENERAL:NAD, no fevers, chills, no weakness no fatigue HEAD: Normocephalic, atraumatic.  EYES: Pupils equal, round, reactive to light. Extraocular muscles intact. No scleral icterus.  MOUTH: Moist mucosal membrane. Dentition intact. No abscess noted.  EAR, NOSE, THROAT: Clear without exudates. No external lesions.  NECK: Supple. No thyromegaly. No nodules. No JVD.  PULMONARY: Mildly ronchorous breath sounds.  CARDIOVASCULAR: S1 and S2. Regular rate and rhythm. No murmurs, rubs, or gallops. No edema. Pedal pulses 2+ bilaterally.  GASTROINTESTINAL: Soft, nontender, nondistended. No masses. Positive bowel sounds. No hepatosplenomegaly.  MUSCULOSKELETAL: No swelling, clubbing, or edema. Range of motion full in all extremities.  NEUROLOGIC: Cranial nerves II through XII are intact. No gross focal neurological deficits. Sensation intact. Reflexes intact.  SKIN: No ulceration, lesions, rashes, or cyanosis. Skin warm and dry. Turgor intact.  PSYCHIATRIC: Mood, affect within normal limits. The patient is awake, alert and oriented x 3. Insight, judgment intact.       IMAGING    CT ANGIO HEAD W OR WO CONTRAST  Result Date: 12/02/2019 CLINICAL DATA:  Carotid artery stenosis. Recent slurred speech and mental status changes. Basilar tip aneurysm. Left carotid stenosis by ultrasound. EXAM: CT ANGIOGRAPHY HEAD AND NECK TECHNIQUE: Multidetector CT imaging of the head and neck was performed using the standard protocol during bolus administration of  intravenous contrast. Multiplanar CT image reconstructions and MIPs were obtained to evaluate the vascular anatomy. Carotid stenosis measurements (when applicable) are obtained utilizing NASCET criteria, using the distal internal carotid diameter as the denominator. CONTRAST:  58mL OMNIPAQUE IOHEXOL 350 MG/ML SOLN COMPARISON:  MRI same day. Ultrasound same day. MRI 08/26/2013. CT angiography 08/19/2013. FINDINGS: CT HEAD FINDINGS Brain: Brain atrophy with chronic small-vessel ischemic changes affecting the pons, thalami, basal ganglia and hemispheric white matter. No sign of acute infarction, mass lesion, hemorrhage, hydrocephalus or extra-axial collection. Vascular: There is atherosclerotic calcification of the major vessels at the base of the brain. 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 See below. Skull: Negative Sinuses: Clear sinuses. Orbits: Lens calcification on the right. Review of the MIP images confirms the above findings CTA NECK FINDINGS Aortic arch: Aortic atherosclerotic calcification. No aneurysm or dissection. Branching pattern is normal without flow limiting stenosis. Right carotid system: Common carotid artery widely patent to the bifurcation and ICA bulb. Somewhat irregular soft and calcified plaque affecting the ICA bulb with minimal diameter of 3.4 mm. Compared to a more distal cervical ICA diameter of 5 mm, this indicates a 30% stenosis. Vessels are tortuous, swinging to the midline behind the oropharynx. Left carotid system: Common carotid artery is patent to the bifurcation. Severe irregular calcified plaque at the carotid bifurcation and ICA bulb. Luminal stenosis likely 1 mm. Compared to a more distal cervical ICA diameter of 5 mm, this indicates an 80% stenosis. Vessels are tortuous, swinging to the midline behind the oropharynx. Vertebral arteries: Calcified plaque at both vertebral artery origin regions but without stenosis greater than 30%. Scattered areas of calcified plaque through the  cervical course of both vertebral arteries but no stenosis greater than 30%. Skeleton: Degenerative cervical spondylosis. Other neck: No mass or lymphadenopathy. Upper chest: Emphysema and pulmonary scarring at the apices. Review of the MIP images confirms the above findings CTA HEAD FINDINGS Anterior circulation: Both internal carotid arteries are patent through the skull base and siphon regions. Ordinary siphon atherosclerotic calcification but without stenosis greater than 30%. Anterior and middle cerebral vessels are patent without large or medium vessel occlusion, aneurysm or vascular malformation. Posterior circulation: Both vertebral arteries are  patent through the foramen magnum. There is atherosclerotic disease at both vertebral artery V4 segments with stenosis estimated at 50% on both sides. Both vessels supply the basilar. No basilar stenosis. 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 Venous sinuses: Patent and normal. Anatomic variants: None significant. Review of the MIP images confirms the above findings IMPRESSION: No acute large or medium vessel occlusion. Aortic Atherosclerosis (ICD10-I70.0) and Emphysema (ICD10-J43.9). Atherosclerotic disease at both carotid bifurcation and ICA bulb regions. 30% ICA stenosis on the right. 80% or greater irregular stenosis of the ICA bulb on the left. Vessels are tortuous, swinging to the midline behind the oropharynx. Atherosclerotic disease of both vertebral artery V4 segments with stenoses estimated at 50%. 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 Electronically Signed   By: Nelson Chimes M.D.   On: 12/02/2019 15:56   DG Chest 2 View  Result Date: 12/19/2019 CLINICAL DATA:  Cough, COPD, hypertension. EXAM: CHEST - 2 VIEW COMPARISON:  Chest x-rays dated 12/05/2019 and 12/02/2019. FINDINGS: Stable cardiomegaly. Lungs are clear. No pleural  effusion or pneumothorax is seen. Probable hiatal hernia. No acute appearing osseous abnormality. IMPRESSION: 1. No active cardiopulmonary disease. No evidence of pneumonia or pulmonary edema. 2. Stable cardiomegaly. 3. Probable hiatal hernia. Electronically Signed   By: Franki Cabot M.D.   On: 12/19/2019 13:13   CT Head Wo Contrast  Result Date: 12/16/2019 CLINICAL DATA:  Near syncopal episode today. EXAM: CT HEAD WITHOUT CONTRAST TECHNIQUE: Contiguous axial images were obtained from the base of the skull through the vertex without intravenous contrast. COMPARISON:  Brain MRI 12/02/2019.  Head CT scan 12/01/2019. FINDINGS: Brain: No evidence of acute infarction, hemorrhage, hydrocephalus, extra-axial collection or mass lesion/mass effect. Extensive chronic microvascular ischemic change again seen. Lacunar infarctions in the basal ganglia and thalami also again seen. Vascular: No hyperdense vessel or unexpected calcification. Skull: Intact.  No focal lesion. Sinuses/Orbits: Negative. Other: None. IMPRESSION: No acute abnormality. Extensive chronic microvascular ischemic disease. Electronically Signed   By: Inge Rise M.D.   On: 12/16/2019 21:40   CT HEAD WO CONTRAST  Result Date: 12/01/2019 CLINICAL DATA:  Slurred speech x2 days. EXAM: CT HEAD WITHOUT CONTRAST TECHNIQUE: Contiguous axial images were obtained from the base of the skull through the vertex without intravenous contrast. COMPARISON:  Dec 19, 2017 FINDINGS: Brain: There is mild cerebral atrophy with widening of the extra-axial spaces and ventricular dilatation. There are areas of decreased attenuation within the white matter tracts of the supratentorial brain, consistent with microvascular disease changes. Small chronic bilateral basal ganglia lacunar infarcts are seen. 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 Skull: Normal. Negative for fracture or  focal lesion. Sinuses/Orbits: No acute finding. Other: None. IMPRESSION: 1. Generalized cerebral atrophy. 2. No acute intracranial abnormality. 3. 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 4. Small chronic bilateral basal ganglia lacunar infarcts. Electronically Signed   By: Virgina Norfolk M.D.   On: 12/01/2019 20:16   CT ANGIO NECK W OR WO CONTRAST  Result Date: 12/02/2019 CLINICAL DATA:  Carotid artery stenosis. Recent slurred speech and mental status changes. Basilar tip aneurysm. Left carotid stenosis by ultrasound. EXAM: CT ANGIOGRAPHY HEAD AND NECK TECHNIQUE: Multidetector CT imaging of the head and neck was performed using the  standard protocol during bolus administration of intravenous contrast. Multiplanar CT image reconstructions and MIPs were obtained to evaluate the vascular anatomy. Carotid stenosis measurements (when applicable) are obtained utilizing NASCET criteria, using the distal internal carotid diameter as the denominator. CONTRAST:  31mL OMNIPAQUE IOHEXOL 350 MG/ML SOLN COMPARISON:  MRI same day. Ultrasound same day. MRI 08/26/2013. CT angiography 08/19/2013. FINDINGS: CT HEAD FINDINGS Brain: Brain atrophy with chronic small-vessel ischemic changes affecting the pons, thalami, basal ganglia and hemispheric white matter. No sign of acute infarction, mass lesion, hemorrhage, hydrocephalus or extra-axial collection. Vascular: There is atherosclerotic calcification of the major vessels at the base of the brain. 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 See below. Skull: Negative Sinuses: Clear sinuses. Orbits: Lens calcification on the right. Review of the MIP images confirms the above findings CTA NECK FINDINGS Aortic arch: Aortic atherosclerotic calcification. No aneurysm or dissection. Branching pattern is normal without flow limiting stenosis. Right carotid system: Common carotid artery widely patent to the bifurcation and  ICA bulb. Somewhat irregular soft and calcified plaque affecting the ICA bulb with minimal diameter of 3.4 mm. Compared to a more distal cervical ICA diameter of 5 mm, this indicates a 30% stenosis. Vessels are tortuous, swinging to the midline behind the oropharynx. Left carotid system: Common carotid artery is patent to the bifurcation. Severe irregular calcified plaque at the carotid bifurcation and ICA bulb. Luminal stenosis likely 1 mm. Compared to a more distal cervical ICA diameter of 5 mm, this indicates an 80% stenosis. Vessels are tortuous, swinging to the midline behind the oropharynx. Vertebral arteries: Calcified plaque at both vertebral artery origin regions but without stenosis greater than 30%. Scattered areas of calcified plaque through the cervical course of both vertebral arteries but no stenosis greater than 30%. Skeleton: Degenerative cervical spondylosis. Other neck: No mass or lymphadenopathy. Upper chest: Emphysema and pulmonary scarring at the apices. Review of the MIP images confirms the above findings CTA HEAD FINDINGS Anterior circulation: Both internal carotid arteries are patent through the skull base and siphon regions. Ordinary siphon atherosclerotic calcification but without stenosis greater than 30%. Anterior and middle cerebral vessels are patent without large or medium vessel occlusion, aneurysm or vascular malformation. Posterior circulation: Both vertebral arteries are patent through the foramen magnum. There is atherosclerotic disease at both vertebral artery V4 segments with stenosis estimated at 50% on both sides. Both vessels supply the basilar. No basilar stenosis. 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 Venous sinuses: Patent and normal. Anatomic variants: None significant. Review of the MIP images confirms the above findings IMPRESSION: No acute large or medium vessel occlusion. Aortic Atherosclerosis (ICD10-I70.0) and  Emphysema (ICD10-J43.9). Atherosclerotic disease at both carotid bifurcation and ICA bulb regions. 30% ICA stenosis on the right. 80% or greater irregular stenosis of the ICA bulb on the left. Vessels are tortuous, swinging to the midline behind the oropharynx. Atherosclerotic disease of both vertebral artery V4 segments with stenoses estimated at 50%. 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 Electronically Signed   By: Nelson Chimes M.D.   On: 12/02/2019 15:56   CT Angio Chest PE W and/or Wo Contrast  Result Date: 12/16/2019 CLINICAL DATA:  Shortness of breath, near syncope EXAM: CT ANGIOGRAPHY CHEST WITH CONTRAST TECHNIQUE: Multidetector CT imaging of the chest was performed using the standard protocol during bolus administration of intravenous contrast. Multiplanar CT image reconstructions and  MIPs were obtained to evaluate the vascular anatomy. CONTRAST:  14mL OMNIPAQUE IOHEXOL 350 MG/ML SOLN COMPARISON:  06/30/2011, 12/05/2019 FINDINGS: Cardiovascular: This is a technically adequate evaluation of the pulmonary vasculature. No filling defects or pulmonary emboli. The heart is normal without pericardial effusion. There is extensive atherosclerosis of the thoracic aorta, with no aneurysm or dissection. Significant atherosclerosis throughout the coronary vasculature greatest in the LAD distribution. Mediastinum/Nodes: No enlarged mediastinal, hilar, or axillary lymph nodes. Thyroid gland, trachea, and esophagus demonstrate no significant findings. Small hiatal hernia is noted. Lungs/Pleura: Upper lobe predominant emphysema is seen, with scattered areas of air trapping. No airspace disease, effusion, or pneumothorax. Central airways are patent. Upper Abdomen: No acute abnormality. Musculoskeletal: No acute or destructive bony lesions. Reconstructed images demonstrate no additional findings. Review of the MIP images confirms the above  findings. IMPRESSION: 1. No CT evidence of pulmonary embolism. 2. Small hiatal hernia. 3. Aortic Atherosclerosis (ICD10-I70.0) and Emphysema (ICD10-J43.9). Electronically Signed   By: Randa Ngo M.D.   On: 12/16/2019 21:40   MR BRAIN WO CONTRAST  Result Date: 12/02/2019 CLINICAL DATA:  Neuro deficit, acute, stroke suspected. Additional history provided: Acute onset slurred speech, headache, mild nausea without vomiting EXAM: MRI HEAD WITHOUT CONTRAST TECHNIQUE: Multiplanar, multiecho pulse sequences of the brain and surrounding structures were obtained without intravenous contrast. COMPARISON:  Noncontrast head CT 12/01/2019, noncontrast head CT 12/19/2017, brain MRI 08/26/2013, CT angiogram head 08/19/2013 FINDINGS: Brain: Multiple sequences are significantly motion degraded. Most notably there is moderate motion degradation of the axial and coronal diffusion-weighted sequence, moderate motion degradation of the axial SWI sequence, moderate motion degradation of the axial T1 weighted sequence, moderate/severe motion degradation of the axial T2 FLAIR sequence and moderate/severe motion degradation of the coronal T2 weighted sequence. Moderate to advanced patchy and confluent T2/FLAIR hyperintensity within the cerebral white matter is nonspecific, but consistent with chronic small vessel ischemic disease. Findings have progressed as compared to prior MRI 08/26/2013. Redemonstrated chronic lacunar infarcts within the bilateral basal ganglia. Also again seen, there are chronic small-vessel ischemic changes within the thalami and pons. Chronic microhemorrhages within the right frontal lobe, left periatrial region and left cerebellum not definitively present on a prior MRI. Stable, mild generalized parenchymal atrophy. There is no acute infarct. No evidence of intracranial mass. No extra-axial fluid collection. No midline shift. Vascular: Expected proximal arterial flow voids. A known 6 x 9 mm basilar tip  aneurysm is incompletely assessed on the current examination but appears grossly unchanged. Skull and upper cervical spine: No focal marrow lesion Sinuses/Orbits: Visualized orbits show no acute finding. Mild ethmoid and right maxillary sinus mucosal thickening. No significant mastoid effusion. IMPRESSION: 1. Motion degraded examination as described. 2. No evidence of acute intracranial abnormality, including acute infarction. 3. Moderate to advanced chronic small vessel ischemic disease has progressed as compared to prior MRI 08/26/2013. Redemonstrated chronic lacunar infarcts within the bilateral basal ganglia. 4. Nonspecific chronic microhemorrhages within the right frontal lobe, left periatrial region and left cerebellum which were not definitively present on the prior MRI. 5. Stable, mild generalized parenchymal atrophy. 6. 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 Electronically Signed   By: Kellie Simmering DO   On: 12/02/2019 08:26   US Carotid Bilateral (at [facility] and [facility] only)  Result Date: 12/02/2019 CLINICAL DATA:  84 year old female with TIA EXAM: BILATERAL CAROTID DUPLEX ULTRASOUND TECHNIQUE: Pearline Cables scale imaging, color Doppler and duplex ultrasound were performed of  bilateral carotid and vertebral arteries in the neck. COMPARISON:  None. FINDINGS: Criteria: Quantification of carotid stenosis is based on velocity parameters that correlate the residual internal carotid diameter with NASCET-based stenosis levels, using the diameter of the distal internal carotid lumen as the denominator for stenosis measurement. The following velocity measurements were obtained: RIGHT ICA:  Systolic 99991111 cm/sec, Diastolic 21 cm/sec CCA:  69 cm/sec SYSTOLIC ICA/CCA RATIO:  2.0 ECA:  120 cm/sec LEFT ICA:  Systolic 0000000 cm/sec, Diastolic 21 cm/sec CCA:  75 cm/sec SYSTOLIC ICA/CCA RATIO:  3.8 ECA:  1 4 cm/sec Right Brachial SBP: Not acquired Left Brachial SBP:  Not acquired RIGHT CAROTID ARTERY: No significant calcifications of the right common carotid artery. Intermediate waveform maintained. Moderate heterogeneous and partially calcified plaque at the right carotid bifurcation. No significant lumen shadowing. Low resistance waveform of the right ICA. Tortuosity RIGHT VERTEBRAL ARTERY: Antegrade flow with low resistance waveform. LEFT CAROTID ARTERY: No significant calcifications of the left common carotid artery. Intermediate waveform maintained. Moderate heterogeneous and partially calcified plaque at the left carotid bifurcation. No significant lumen shadowing. Low resistance waveform of the left ICA. Tortuosity LEFT VERTEBRAL ARTERY:  Antegrade flow with low resistance waveform. IMPRESSION: Right: Color duplex indicates moderate heterogeneous and calcified plaque, with no hemodynamically significant stenosis by duplex criteria in the extracranial cerebrovascular circulation. Left: Heterogeneous and partially calcified plaque at the left carotid bifurcation, with discordant results regarding degree of stenosis by established duplex criteria. Peak velocity suggests 70% - 99% stenosis, with the ICA/ CCA ratio suggesting a lesser degree of stenosis. If establishing a more accurate degree of stenosis is required, cerebral angiogram should be considered, or as a second best test, CTA. Signed, Dulcy Fanny. Dellia Nims, RPVI [facility] Radiology Electronically Signed   By: Corrie Mckusick D.O.   On: 12/02/2019 11:33   PERIPHERAL VASCULAR CATHETERIZATION  Result Date: 12/04/2019 See op note  DG Chest Port 1 View  Result Date: 12/05/2019 CLINICAL DATA:  Shortness of breath. EXAM: PORTABLE CHEST 1 VIEW COMPARISON:  12/02/2019 FINDINGS: Heart size remains enlarged. Calcification of the thoracic aorta is similar to the prior study. No consolidation or sign of pleural effusion. Slight increase in density along the paraspinal region  likely reflects hiatal hernia. Spinal degenerative changes. No acute or destructive bone process. IMPRESSION: 1. No active cardiopulmonary disease. 2. Stable cardiomegaly and aortic atherosclerosis. Electronically Signed   By: Zetta Bills M.D.   On: 12/05/2019 10:42   DG Chest Portable 1 View  Result Date: 12/02/2019 CLINICAL DATA:  Slurred speech x2 days. EXAM: PORTABLE CHEST 1 VIEW COMPARISON:  January 26, 2014 FINDINGS: Mild diffuse chronic appearing increased lung markings are seen. There is no evidence of acute infiltrate, pleural effusion or pneumothorax. The cardiac silhouette is moderately enlarged. There is marked severity calcification of the aortic arch. Multilevel degenerative changes seen throughout the thoracic spine. IMPRESSION: Chronic-appearing increased lung markings without evidence of acute or active cardiopulmonary disease. Electronically Signed   By: Virgina Norfolk M.D.   On: 12/02/2019 00:33   ECHOCARDIOGRAM COMPLETE  Result Date: 12/03/2019    ECHOCARDIOGRAM REPORT   Patient Name:   KENZLIE CERA Date of Exam: 12/02/2019 Medical Rec #:  [id number]     Height:       61.0 in Accession #:    [id number]    Weight:       169.0 lb Date of Birth:  16-Feb-1936      BSA:  1.758 m Patient Age:    84 years      BP:           156/69 mmHg Patient Gender: F             HR:           70 bpm. Exam Location:  [facility] Procedure: 2D Echo, Cardiac Doppler and Color Doppler Indications:     TIA 435.9  History:         Patient has no prior history of Echocardiogram examinations.                  COPD; Risk Factors:Hypertension.  Sonographer:     Sherrie Sport RDCS (AE) Referring Phys:  [id number] Arvella Merles MANSY Diagnosing Phys: Yolonda Kida MD IMPRESSIONS  1. Left ventricular ejection fraction, by estimation, is 65 to 70%. The left ventricle has normal function. The left ventricle has no regional wall motion abnormalities. There is mild concentric left ventricular hypertrophy. Left ventricular diastolic  parameters are consistent with Grade I diastolic dysfunction (impaired relaxation).  2. Right ventricular systolic function is moderately reduced. The right ventricular size is moderately enlarged. Mildly increased right ventricular wall thickness. There is normal pulmonary artery systolic pressure.  3. The mitral valve is normal in structure. No evidence of mitral valve regurgitation.  4. The aortic valve is grossly normal. Aortic valve regurgitation is not visualized. FINDINGS  Left Ventricle: Left ventricular ejection fraction, by estimation, is 65 to 70%. The left ventricle has normal function. The left ventricle has no regional wall motion abnormalities. The left ventricular internal cavity size was normal in size. There is  mild concentric left ventricular hypertrophy. Left ventricular diastolic parameters are consistent with Grade I diastolic dysfunction (impaired relaxation). Right Ventricle: The right ventricular size is moderately enlarged. Mildly increased right ventricular wall thickness. Right ventricular systolic function is moderately reduced. There is normal pulmonary artery systolic pressure. The tricuspid regurgitant velocity is 1.81 m/s, and with an assumed right atrial pressure of 10 mmHg, the estimated right ventricular systolic pressure is [id number] mmHg. Left Atrium: Left atrial size was normal in size. Right Atrium: Right atrial size was normal in size. Pericardium: Trivial pericardial effusion is present. Mitral Valve: The mitral valve is normal in structure. No evidence of mitral valve regurgitation. Tricuspid Valve: The tricuspid valve is normal in structure. Tricuspid valve regurgitation is trivial. Aortic Valve: The aortic valve is grossly normal. Aortic valve regurgitation is not visualized. Aortic valve mean gradient measures 3.0 mmHg. Aortic valve peak gradient measures 5.5 mmHg. Aortic valve area, by VTI measures 3.64 cm. Pulmonic Valve: The pulmonic valve was grossly normal. Pulmonic  valve regurgitation is not visualized. Aorta: The aortic root is normal in size and structure. IAS/Shunts: No atrial level shunt detected by color flow Doppler.  LEFT VENTRICLE PLAX 2D LVIDd:         3.50 cm  Diastology LVIDs:         2.14 cm  LV e' lateral:   6.42 cm/s LV PW:         0.91 cm  LV E/e' lateral: 16.2 LV IVS:        1.21 cm  LV e' medial:    6.31 cm/s LVOT diam:     2.00 cm  LV E/e' medial:  16.5 LV SV:         82 LV SV Index:   47 LVOT Area:     3.14 cm  RIGHT VENTRICLE RV Basal diam:  2.26 cm LEFT ATRIUM             Index       RIGHT ATRIUM           Index LA diam:        3.40 cm 1.93 cm/m  RA Area:     11.70 cm LA Vol (A2C):   33.6 ml 19.11 ml/m RA Volume:   23.20 ml  13.19 ml/m LA Vol (A4C):   33.9 ml 19.28 ml/m LA Biplane Vol: 33.6 ml 19.11 ml/m  AORTIC VALVE                   PULMONIC VALVE AV Area (Vmax):    3.11 cm    PV Vmax:       0.70 m/s AV Area (Vmean):   3.72 cm    PV Peak grad:  2.0 mmHg AV Area (VTI):     3.64 cm AV Vmax:           117.00 cm/s AV Vmean:          79.400 cm/s AV VTI:            0.225 m AV Peak Grad:      5.5 mmHg AV Mean Grad:      3.0 mmHg LVOT Vmax:         116.00 cm/s LVOT Vmean:        93.900 cm/s LVOT VTI:          0.261 m LVOT/AV VTI ratio: 1.16  AORTA Ao Root diam: 3.10 cm MITRAL VALVE                TRICUSPID VALVE MV Area (PHT): 2.18 cm     TR Peak grad:   13.1 mmHg MV Decel Time: 348 msec     TR Vmax:        181.00 cm/s MV E velocity: 104.00 cm/s MV A velocity: 135.00 cm/s  SHUNTS MV E/A ratio:  0.77         Systemic VTI:  0.26 m                             Systemic Diam: 2.00 cm Dwayne Prince Rome MD Electronically signed by Yolonda Kida MD Signature Date/Time: 12/03/2019/6:39:32 AM    Final       ASSESSMENT/PLAN   Moderate Acute exacerbation of COPD  -agree with DUoneb and solumedrol 40 BID-changed to prednisone 30 po daily - 12/20/19  - d/cd Daliresp 516mcg daily-12/17/19 - continue zithromax 500mg   po daily  -MetaNEB q4h- increased  frequency to QID - 12/20/19  - adding Mucomyst 55ml 20% BID-Finished 12/18/19  - IS at bedside -continue PT/OT     Acute on Chronic hypoxemic respiratory failure  - patient is on 4L/min [place] and O2 saturation is >95% Suspect atelectasis will incrase recruitment maneuvers.   - she is on home settings at this time  - recommend PT/OT and early mobilization    Thank you for allowing me to participate in the care of this patient.   Patient/Family are satisfied with care plan and all questions have been answered.  This document was prepared using Dragon voice recognition software and may include unintentional dictation errors.     Ottie Glazier, M.D.  Division of [place]

## 2019-12-20 NOTE — Progress Notes (Signed)
PROGRESS NOTE    Toni White  [id number] DOB: 02/26/1936 DOA: 12/16/2019 PCP: Toni Sons, MD      Brief Narrative:  Toni White is a 84 y.o. F with hx COPD on home O2 2L, FEV1 22%, recent stroke, and HTN who presented with few days progressive DOE and then pre-syncope.  Patient was getting ready to get in the car, and slumped to the ground weak, nearly passed out.  In the ER, ECG and troponins normal (there was an initial spurious elevated troponin, repeats both normal).  CTA chest showed no pneumonia or PE.        Assessment & Plan:  Acute on chronic hypoxic respiratory failure due to COPD exacerbation and atelectasis COPD exacerbation Atelectasis Patient was admitted started on steroids and bronchodilators.  Pulmonology were consulted vomiting, and Augmentin therapy with Zithromax, MetaNebs, Mucomyst.  The patient unfortunately is having worsening hypoxemia at rest, and I agree with pulmonology, likely this is the recruitment of alveoli due to inactivity post stroke.  Overall this is a very poor prognostic factor I feel.  -Continue prednisone -Continue Xopenex -Continue azithromycin -Continue MetaNebs -continue aggressive pulmonary toilet    Acute metabolic encephalopathy Patient is having fluctuating disorientation, seems better to me today.  Chest x-ray yesterday clear, procalcitonin negative.  Pre-syncope This was the initial presenting complaint, patient orthostatic on arrival.  Given fluids, metoprolol held, orthostatics resolved.  Yesterday given Lasix, now tachycardic at rest.   -Resume metoprolol  Hypokalemia Mild -Replete potassium  Hypertension BP elevated -Continue amlodipine -Continue metorpolol  Recent TIA -Continue aspirin, Plavix  Mood -Continue citalopram  Peripheral neuropathy -Continue gabapentin  Hypothyroidism -Continue levothyroxine       Disposition: Status is: Inpatient  The patient has a new acute metabolic  encephalopathy/confusion, and she requires ongoing hospital level care.       Dispo: The patient is from: Home              Anticipated d/c is to: TBD              Anticipated d/c date is: 2 to 3 days at least                                    MDM: The below labs and imaging reports reviewed and summarized above.  Medication management as above.    This is a severe exacerbation of severe COPD    DVT prophylaxis: Lovenox Code Status: DNR Family Communication: Attempted call to daughter, no answer    Consultants:   Pulmonology  Procedures:     Antimicrobials:   Azithromycin 5/12 >>  Culture data:              Subjective: Tachycardic with exertion, dyspneic, very fatigued.       Objective: Vitals:   12/19/19 2156 12/19/19 2300 12/20/19 0500 12/20/19 0800  BP: 116/69 (!) 169/83  (!) 148/76  Pulse: (!) 103 (!) 106  98  Resp: (!) 24 20  19   Temp: 98.3 F (36.8 C) 98.1 F (36.7 C)  98.2 F (36.8 C)  TempSrc: Oral   Oral  SpO2: 98% 97%  98%  Weight:   71.3 kg   Height:        Intake/Output Summary (Last 24 hours) at 12/20/2019 1532 Last data filed at 12/20/2019 0500 Gross per 24 hour  Intake --  Output 600 ml  Net -600 ml  Filed Weights   12/16/19 1454 12/20/19 0500  Weight: 77.1 kg 71.3 kg    Examination: General appearance: Frail elderly female, sitting up in recliner, appears debilitated and weak.  No obvious distress.     HEENT: Anicteric, right and conjunctivitis appears resolved, left eye, lids and lashes normal.  No nasal deformity, discharge, or epistaxis.  Nasal cannula in place.  Lips normal, oropharynx moist, normal lesions, dentures in place, hearing diminished. Skin: No suspicious rashes or lesions. Cardiac: Tachycardic, regular, no murmurs, JVP not visible, no lower extremity edema Respiratory: Respiratory rate increased, lung sounds diminished bilaterally, rales bilaterally, no wheezing. Abdomen: No  tenderness palpation or guarding, no hepatosplenomegaly. MSK: Diminished muscle mass diffusely, diminished subcutaneous fat. Neuro: Awake and attempts to answer questions, but is really generally weak, disoriented.  Speech fluent. Psych: Attention diminished, affect blunted, judgment insight appear moderately impaired by dementia        Data Reviewed: I have personally reviewed following labs and imaging studies:  CBC: Recent Labs  Lab 12/16/19 1504 12/17/19 0626 12/17/19 0922 12/19/19 0411 12/20/19 0729  WBC 15.3* 13.3* 14.7* 18.1* 25.6*  HGB 11.5* 13.1 11.9* 12.1 12.4  HCT 34.5* 39.7 36.6 37.7 38.6  MCV 90.6 91.3 92.4 92.6 92.8  PLT 362 345 367 484* 0000000   Basic Metabolic Panel: Recent Labs  Lab 12/16/19 1504 12/16/19 2311 12/17/19 0626 12/19/19 0411 12/20/19 0729  NA 138 140 141 145 145  K 3.9 3.8 3.8 2.9* 3.3*  CL 99 97* 100 102 106  CO2 30 31 29 29 31   GLUCOSE 118* 100* 88 96 105*  BUN 10 10 11 22 20   CREATININE 0.71 0.58 0.53 0.58 0.41*  CALCIUM 9.7 9.6 9.4 9.1 8.9  MG  --   --   --  1.8  --    GFR: Estimated Creatinine Clearance: 49.8 mL/min (A) (by C-G formula based on SCr of 0.41 mg/dL (L)). Liver Function Tests: No results for input(s): AST, ALT, ALKPHOS, BILITOT, PROT, ALBUMIN in the last 168 hours. No results for input(s): LIPASE, AMYLASE in the last 168 hours. No results for input(s): AMMONIA in the last 168 hours. Coagulation Profile: Recent Labs  Lab 12/17/19 0737  INR 1.1   Cardiac Enzymes: No results for input(s): CKTOTAL, CKMB, CKMBINDEX, TROPONINI in the last 168 hours. BNP (last 3 results) No results for input(s): PROBNP in the last 8760 hours. HbA1C: No results for input(s): HGBA1C in the last 72 hours. CBG: Recent Labs  Lab 12/17/19 0611 12/18/19 0457 12/20/19 0508  GLUCAP 82 140* 104*   Lipid Profile: No results for input(s): CHOL, HDL, LDLCALC, TRIG, CHOLHDL, LDLDIRECT in the last 72 hours. Thyroid Function Tests: No  results for input(s): TSH, T4TOTAL, FREET4, T3FREE, THYROIDAB in the last 72 hours. Anemia Panel: No results for input(s): VITAMINB12, FOLATE, FERRITIN, TIBC, IRON, RETICCTPCT in the last 72 hours. Urine analysis:    Component Value Date/Time   COLORURINE YELLOW (A) 12/16/2019 1504   APPEARANCEUR HAZY (A) 12/16/2019 1504   APPEARANCEUR Hazy 01/26/2014 1040   LABSPEC 1.030 12/16/2019 1504   LABSPEC 1.017 01/26/2014 1040   PHURINE 6.0 12/16/2019 1504   GLUCOSEU NEGATIVE 12/16/2019 1504   GLUCOSEU Negative 01/26/2014 1040   HGBUR NEGATIVE 12/16/2019 1504   BILIRUBINUR NEGATIVE 12/16/2019 1504   BILIRUBINUR Negative 01/26/2014 1040   KETONESUR 20 (A) 12/16/2019 1504   PROTEINUR NEGATIVE 12/16/2019 1504   NITRITE NEGATIVE 12/16/2019 1504   LEUKOCYTESUR SMALL (A) 12/16/2019 1504   LEUKOCYTESUR Negative 01/26/2014 1040  Sepsis Labs: @LABRCNTIP (procalcitonin:4,lacticacidven:4)  ) Recent Results (from the past 240 hour(s))  SARS Coronavirus 2 by RT PCR (hospital order, performed in [facility] hospital lab) Nasopharyngeal Nasopharyngeal Swab     Status: None   Collection Time: 12/16/19  9:14 PM   Specimen: Nasopharyngeal Swab  Result Value Ref Range Status   SARS Coronavirus 2 NEGATIVE NEGATIVE Final    Comment: (NOTE) SARS-CoV-2 target nucleic acids are NOT DETECTED. The SARS-CoV-2 RNA is generally detectable in upper and lower respiratory specimens during the acute phase of infection. The lowest concentration of SARS-CoV-2 viral copies this assay can detect is 250 copies / mL. A negative result does not preclude SARS-CoV-2 infection and should not be used as the sole basis for treatment or other patient management decisions.  A negative result may occur with improper specimen collection / handling, submission of specimen other than nasopharyngeal swab, presence of viral mutation(s) within the areas targeted by this assay, and inadequate number of viral copies (<250 copies / mL).  A negative result must be combined with clinical observations, patient history, and epidemiological information. Fact Sheet for Patients:   StrictlyIdeas.no Fact Sheet for Healthcare Providers: BankingDealers.co.za This test is not yet approved or cleared  by the [place] FDA and has been authorized for detection and/or diagnosis of SARS-CoV-2 by FDA under an Emergency Use Authorization (EUA).  This EUA will remain in effect (meaning this test can be used) for the duration of the COVID-19 declaration under Section 564(b)(1) of the Act, 21 U.S.C. section 360bbb-3(b)(1), unless the authorization is terminated or revoked sooner. Performed at [facility], [address] [place], [place] [address]          Radiology Studies: DG Chest 2 View  Result Date: 12/19/2019 CLINICAL DATA:  Cough, COPD, hypertension. EXAM: CHEST - 2 VIEW COMPARISON:  Chest x-rays dated 12/05/2019 and 12/02/2019. FINDINGS: Stable cardiomegaly. Lungs are clear. No pleural effusion or pneumothorax is seen. Probable hiatal hernia. No acute appearing osseous abnormality. IMPRESSION: 1. No active cardiopulmonary disease. No evidence of pneumonia or pulmonary edema. 2. Stable cardiomegaly. 3. Probable hiatal hernia. Electronically Signed   By: Franki Cabot M.D.   On: 12/19/2019 13:13        Scheduled Meds: . amLODipine  5 mg Oral Daily  . aspirin  81 mg Oral Daily  . azithromycin  500 mg Oral Daily  . citalopram  20 mg Oral Daily  . clopidogrel  75 mg Oral Daily  . enoxaparin (LOVENOX) injection  40 mg Subcutaneous Q24H  . gabapentin  100 mg Oral BID  . levothyroxine  37.5 mcg Oral Q0600  . metoprolol succinate  25 mg Oral Daily  . multivitamin-lutein  1 capsule Oral BID  . pravastatin  40 mg Oral QHS  . [START ON 12/21/2019] predniSONE  30 mg Oral Q breakfast  . sodium chloride flush  3 mL Intravenous Once  . sodium chloride flush  3 mL  Intravenous Q12H   Continuous Infusions:    LOS: 2 days    Time spent: 35 minutes    Edwin Dada, MD [facility] 12/20/2019, 3:32 PM     Please page though [place] or Epic secure chat:  For password, contact charge nurse

## 2019-12-20 NOTE — TOC Progression Note (Signed)
Transition of Care [facility]) - Progression Note    Patient Details  Name: Toni White MRN: [id number] Date of Birth: 26-Oct-1935  Transition of Care [facility]) CM/SW Contact  Boris Sharper, LCSW Phone Number: 12/20/2019, 3:16 PM  Clinical Narrative:    CSW called pt's daughter to discuss discharge plans, daughter did not answer.  TOC will continue to follow        Expected Discharge Plan and Services                                                 Social Determinants of Health (SDOH) Interventions    Readmission Risk Interventions No flowsheet data found.

## 2019-12-21 ENCOUNTER — Inpatient Hospital Stay: Payer: Medicare HMO

## 2019-12-21 ENCOUNTER — Encounter: Payer: Self-pay | Admitting: Family Medicine

## 2019-12-21 LAB — CBC
HCT: 38.4 % (ref 36.0–46.0)
Hemoglobin: 12.3 g/dL (ref 12.0–15.0)
MCH: 29.7 pg (ref 26.0–34.0)
MCHC: 32 g/dL (ref 30.0–36.0)
MCV: 92.8 fL (ref 80.0–100.0)
Platelets: 370 10*3/uL (ref 150–400)
RBC: 4.14 MIL/uL (ref 3.87–5.11)
RDW: 13.2 % (ref 11.5–15.5)
WBC: 17.8 10*3/uL — ABNORMAL HIGH (ref 4.0–10.5)
nRBC: 0 % (ref 0.0–0.2)

## 2019-12-21 LAB — GLUCOSE, CAPILLARY
Glucose-Capillary: 88 mg/dL (ref 70–99)
Glucose-Capillary: 87 mg/dL (ref 70–99)

## 2019-12-21 LAB — BASIC METABOLIC PANEL
Anion gap: 11 (ref 5–15)
BUN: 24 mg/dL — ABNORMAL HIGH (ref 8–23)
CO2: 30 mmol/L (ref 22–32)
Calcium: 9 mg/dL (ref 8.9–10.3)
Chloride: 106 mmol/L (ref 98–111)
Creatinine, Ser: 0.38 mg/dL — ABNORMAL LOW (ref 0.44–1.00)
GFR calc Af Amer: >60 mL/min (ref >60)
GFR calc non Af Amer: >60 mL/min (ref >60)
Glucose, Bld: 94 mg/dL (ref 70–99)
Potassium: 3.7 mmol/L (ref 3.5–5.1)
Sodium: 147 mmol/L — ABNORMAL HIGH (ref 135–145)

## 2019-12-21 LAB — BRAIN NATRIURETIC PEPTIDE: B Natriuretic Peptide: 152 pg/mL — ABNORMAL HIGH (ref 0.0–100.0)

## 2019-12-21 LAB — PROCALCITONIN: Procalcitonin: <0.1 ng/mL

## 2019-12-21 MED ORDER — FUROSEMIDE 10 MG/ML IJ SOLN
40.0000 mg | Freq: Every day | INTRAMUSCULAR | Status: DC
  Administered 2019-12-21 – 2019-12-22 (×2): 40 mg via INTRAVENOUS
  Filled 2019-12-21 (×2): qty 4

## 2019-12-21 MED ORDER — PREDNISONE 20 MG PO TABS
20.0000 mg | ORAL_TABLET | Freq: Every day | ORAL | Status: DC
  Administered 2019-12-22 – 2019-12-23 (×2): 20 mg via ORAL
  Filled 2019-12-21 (×2): qty 1

## 2019-12-21 NOTE — Progress Notes (Addendum)
Pulmonary Medicine          Date: 12/21/2019,   MRN# [id number] Toni White Feb 05, 1936     AdmissionWeight: 77.1 kg                 CurrentWeight: 73.7 kg  Referring physician: Dr Loleta Books    CHIEF COMPLAINT:   Worsening shortness of breath at rest.    SUBJECTIVE   Patient resting in bed. No acute events overnight.  She is still on 4L/min but spO2 100%.    Reviewed CXR today - mild interstitial edema.    PAST MEDICAL HISTORY   Past Medical History:  Diagnosis Date  . COPD (chronic obstructive pulmonary disease) ([place])   . DNR (do not resuscitate) 11/2019   DNR/DNI, confirmed in person by patient and her daughter/POA  . History of chicken pox   . Hyperlipidemia   . Hypertension   . Ulcer      SURGICAL HISTORY   Past Surgical History:  Procedure Laterality Date  . ABDOMINAL HYSTERECTOMY  2003   with BSO due to bleeding  . APPENDECTOMY    . BREAST BIOPSY Left 2005   stereo breast biopsy benign  . BREAST BIOPSY Right 2005   core bx. benign  . BREAST BIOPSY Left 2000?   benign  . BREAST CYST EXCISION Left 1990   incision and draniage of abscess  . CAROTID PTA/STENT INTERVENTION Left 12/04/2019   Procedure: CAROTID PTA/STENT INTERVENTION;  Surgeon: Katha Cabal, MD;  Location: [facility];  Service: Cardiovascular;  Laterality: Left;  . CHOLECYSTECTOMY    . COLONOSCOPY  2003   Dr. Vira Agar. Hyperplastic polyps; Single polyp excision from rectum  . CT Scan of Brain  08/20/2013   Mild diffuse cortical atrophy. Mid chronic ischemic white matter disease. 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 [place]  . INCISION AND DRAINAGE BREAST ABSCESS Left 1990  . TONSILLECTOMY  1952  . UPPER GI ENDOSCOPY  02/26/2002   Dr. Tiffany Kocher; Gastritis. Single gastric ectasia     FAMILY HISTORY   Family History  Problem Relation Age of Onset  . Heart disease Father   . Congestive Heart Failure Father   . Heart  disease Mother   . Congestive Heart Failure Mother   . Cancer Brother        lung  . Lung cancer Brother   . Diabetes Daughter   . Diabetes Daughter   . Breast cancer Neg Hx      SOCIAL HISTORY   Social History   Tobacco Use  . Smoking status: Former Smoker    Packs/day: 1.00    Years: 30.00    Pack years: 30.00    Types: Cigarettes  . Smokeless tobacco: Never Used  . Tobacco comment: quit around 1989  Substance Use Topics  . Alcohol use: No  . Drug use: No     MEDICATIONS    Home Medication:    Current Medication:  Current Facility-Administered Medications:  .  acetaminophen (TYLENOL) tablet 650 mg, 650 mg, Oral, Q6H PRN **OR** acetaminophen (TYLENOL) suppository 650 mg, 650 mg, Rectal, Q6H PRN, Mansy, Jan A, MD .  alum & mag hydroxide-simeth (MAALOX/MYLANTA) 200-200-20 MG/5ML suspension 30 mL, 30 mL, Oral, Q6H PRN, Mansy, Jan A, MD .  amLODipine (NORVASC) tablet 5 mg, 5 mg, Oral, Daily, Mansy, Jan A, MD, 5 mg at 12/21/19 0931 .  aspirin chewable tablet 81 mg, 81 mg,  Oral, Daily, Mansy, Jan A, MD, 81 mg at 12/21/19 0932 .  cefTRIAXone (ROCEPHIN) 1 g in sodium chloride 0.9 % 100 mL IVPB, 1 g, Intravenous, Q24H, Danford, Christopher P, MD .  citalopram (CELEXA) tablet 20 mg, 20 mg, Oral, Daily, Mansy, Jan A, MD, 20 mg at 12/21/19 0931 .  clopidogrel (PLAVIX) tablet 75 mg, 75 mg, Oral, Daily, Mansy, Jan A, MD, 75 mg at 12/21/19 0931 .  enoxaparin (LOVENOX) injection 40 mg, 40 mg, Subcutaneous, Q24H, Mansy, Jan A, MD, 40 mg at 12/21/19 0930 .  gabapentin (NEURONTIN) capsule 100 mg, 100 mg, Oral, BID, Mansy, Jan A, MD, 100 mg at 12/21/19 0939 .  levalbuterol (XOPENEX) nebulizer solution 0.63 mg, 0.63 mg, Nebulization, Q6H PRN, Danford, Suann Larry, MD .  levothyroxine (SYNTHROID) tablet 37.5 mcg, 37.5 mcg, Oral, Q0600, Mansy, Jan A, MD, 37.5 mcg at 12/21/19 0630 .  magnesium hydroxide (MILK OF MAGNESIA) suspension 30 mL, 30 mL, Oral, Daily PRN, Mansy, Jan A, MD .   metoprolol succinate (TOPROL-XL) 24 hr tablet 25 mg, 25 mg, Oral, Daily, Danford, Suann Larry, MD, 25 mg at 12/21/19 0931 .  multivitamin-lutein (OCUVITE-LUTEIN) capsule 1 capsule, 1 capsule, Oral, BID, Mansy, Jan A, MD, 1 capsule at 12/21/19 0932 .  ondansetron (ZOFRAN) tablet 4 mg, 4 mg, Oral, Q6H PRN **OR** ondansetron (ZOFRAN) injection 4 mg, 4 mg, Intravenous, Q6H PRN, Mansy, Jan A, MD .  potassium chloride SA (KLOR-CON) CR tablet 40 mEq, 40 mEq, Oral, Once, Danford, Suann Larry, MD .  pravastatin (PRAVACHOL) tablet 40 mg, 40 mg, Oral, QHS, Mansy, Jan A, MD, 40 mg at 12/20/19 2155 .  predniSONE (DELTASONE) tablet 30 mg, 30 mg, Oral, Q breakfast, Mairyn Lenahan, MD, 30 mg at 12/21/19 0931 .  sodium chloride flush (NS) 0.9 % injection 3 mL, 3 mL, Intravenous, Once, Duffy Bruce, MD .  sodium chloride flush (NS) 0.9 % injection 3 mL, 3 mL, Intravenous, Q12H, Mansy, Jan A, MD, 3 mL at 12/21/19 [id number]    ALLERGIES   Flexeril  [cyclobenzaprine hcl], Reglan [metoclopramide], Relafen [nabumetone], Risperdal  [risperidone], Sulfa antibiotics, Tape, Triamterene-hctz, and Hctz [hydrochlorothiazide]     REVIEW OF SYSTEMS    Review of Systems:  Gen:  Denies  fever, sweats, chills weigh loss  HEENT: Denies blurred vision, double vision, ear pain, eye pain, hearing loss, nose bleeds, sore throat Cardiac:  No dizziness, chest pain or heaviness, chest tightness,edema Resp:   Denies cough or sputum porduction, shortness of breath,wheezing, hemoptysis,  Gi: Denies swallowing difficulty, stomach pain, nausea or vomiting, diarrhea, constipation, bowel incontinence Gu:  Denies bladder incontinence, burning urine Ext:   Denies Joint pain, stiffness or swelling Skin: Denies  skin rash, easy bruising or bleeding or hives Endoc:  Denies polyuria, polydipsia , polyphagia or weight change Psych:   Denies depression, insomnia or hallucinations   Other:  All other systems negative   VS: BP (!)  153/91 (BP Location: Left Leg)   Pulse 89   Temp 97.9 F (36.6 C)   Resp 16   Ht 5\' 1"  (1.549 m)   Wt 73.7 kg   SpO2 99%   BMI 30.70 kg/m      PHYSICAL EXAM    GENERAL:NAD, no fevers, chills, no weakness no fatigue HEAD: Normocephalic, atraumatic.  EYES: Pupils equal, round, reactive to light. Extraocular muscles intact. No scleral icterus.  MOUTH: Moist mucosal membrane. Dentition intact. No abscess noted.  EAR, NOSE, THROAT: Clear without exudates. No external lesions.  NECK: Supple. No thyromegaly.  No nodules. No JVD.  PULMONARY: Mildly ronchorous breath sounds.  CARDIOVASCULAR: S1 and S2. Regular rate and rhythm. No murmurs, rubs, or gallops. No edema. Pedal pulses 2+ bilaterally.  GASTROINTESTINAL: Soft, nontender, nondistended. No masses. Positive bowel sounds. No hepatosplenomegaly.  MUSCULOSKELETAL: No swelling, clubbing, or edema. Range of motion full in all extremities.  NEUROLOGIC: Cranial nerves II through XII are intact. No gross focal neurological deficits. Sensation intact. Reflexes intact.  SKIN: No ulceration, lesions, rashes, or cyanosis. Skin warm and dry. Turgor intact.  PSYCHIATRIC: Mood, affect within normal limits. The patient is awake, alert and oriented x 3. Insight, judgment intact.       IMAGING    CT ANGIO HEAD W OR WO CONTRAST  Result Date: 12/02/2019 CLINICAL DATA:  Carotid artery stenosis. Recent slurred speech and mental status changes. Basilar tip aneurysm. Left carotid stenosis by ultrasound. EXAM: CT ANGIOGRAPHY HEAD AND NECK TECHNIQUE: Multidetector CT imaging of the head and neck was performed using the standard protocol during bolus administration of intravenous contrast. Multiplanar CT image reconstructions and MIPs were obtained to evaluate the vascular anatomy. Carotid stenosis measurements (when applicable) are obtained utilizing NASCET criteria, using the distal internal carotid diameter as the denominator. CONTRAST:  38mL OMNIPAQUE  IOHEXOL 350 MG/ML SOLN COMPARISON:  MRI same day. Ultrasound same day. MRI 08/26/2013. CT angiography 08/19/2013. FINDINGS: CT HEAD FINDINGS Brain: Brain atrophy with chronic small-vessel ischemic changes affecting the pons, thalami, basal ganglia and hemispheric white matter. No sign of acute infarction, mass lesion, hemorrhage, hydrocephalus or extra-axial collection. Vascular: There is atherosclerotic calcification of the major vessels at the base of the brain. 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 See below. Skull: Negative Sinuses: Clear sinuses. Orbits: Lens calcification on the right. Review of the MIP images confirms the above findings CTA NECK FINDINGS Aortic arch: Aortic atherosclerotic calcification. No aneurysm or dissection. Branching pattern is normal without flow limiting stenosis. Right carotid system: Common carotid artery widely patent to the bifurcation and ICA bulb. Somewhat irregular soft and calcified plaque affecting the ICA bulb with minimal diameter of 3.4 mm. Compared to a more distal cervical ICA diameter of 5 mm, this indicates a 30% stenosis. Vessels are tortuous, swinging to the midline behind the oropharynx. Left carotid system: Common carotid artery is patent to the bifurcation. Severe irregular calcified plaque at the carotid bifurcation and ICA bulb. Luminal stenosis likely 1 mm. Compared to a more distal cervical ICA diameter of 5 mm, this indicates an 80% stenosis. Vessels are tortuous, swinging to the midline behind the oropharynx. Vertebral arteries: Calcified plaque at both vertebral artery origin regions but without stenosis greater than 30%. Scattered areas of calcified plaque through the cervical course of both vertebral arteries but no stenosis greater than 30%. Skeleton: Degenerative cervical spondylosis. Other neck: No mass or lymphadenopathy. Upper chest: Emphysema and pulmonary scarring at the apices. Review of the MIP images confirms the above findings CTA HEAD FINDINGS  Anterior circulation: Both internal carotid arteries are patent through the skull base and siphon regions. Ordinary siphon atherosclerotic calcification but without stenosis greater than 30%. Anterior and middle cerebral vessels are patent without large or medium vessel occlusion, aneurysm or vascular malformation. Posterior circulation: Both vertebral arteries are patent through the foramen magnum. There is atherosclerotic disease at both vertebral artery V4 segments with stenosis estimated at 50% on both sides. Both vessels supply the basilar. No basilar stenosis. 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 Venous sinuses:  Patent and normal. Anatomic variants: None significant. Review of the MIP images confirms the above findings IMPRESSION: No acute large or medium vessel occlusion. Aortic Atherosclerosis (ICD10-I70.0) and Emphysema (ICD10-J43.9). Atherosclerotic disease at both carotid bifurcation and ICA bulb regions. 30% ICA stenosis on the right. 80% or greater irregular stenosis of the ICA bulb on the left. Vessels are tortuous, swinging to the midline behind the oropharynx. Atherosclerotic disease of both vertebral artery V4 segments with stenoses estimated at 50%. 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 Electronically Signed   By: Nelson Chimes M.D.   On: 12/02/2019 15:56   DG Chest 2 View  Result Date: 12/19/2019 CLINICAL DATA:  Cough, COPD, hypertension. EXAM: CHEST - 2 VIEW COMPARISON:  Chest x-rays dated 12/05/2019 and 12/02/2019. FINDINGS: Stable cardiomegaly. Lungs are clear. No pleural effusion or pneumothorax is seen. Probable hiatal hernia. No acute appearing osseous abnormality. IMPRESSION: 1. No active cardiopulmonary disease. No evidence of pneumonia or pulmonary edema. 2. Stable cardiomegaly. 3. Probable hiatal hernia. Electronically Signed   By: Franki Cabot M.D.   On:  12/19/2019 13:13   CT Head Wo Contrast  Result Date: 12/16/2019 CLINICAL DATA:  Near syncopal episode today. EXAM: CT HEAD WITHOUT CONTRAST TECHNIQUE: Contiguous axial images were obtained from the base of the skull through the vertex without intravenous contrast. COMPARISON:  Brain MRI 12/02/2019.  Head CT scan 12/01/2019. FINDINGS: Brain: No evidence of acute infarction, hemorrhage, hydrocephalus, extra-axial collection or mass lesion/mass effect. Extensive chronic microvascular ischemic change again seen. Lacunar infarctions in the basal ganglia and thalami also again seen. Vascular: No hyperdense vessel or unexpected calcification. Skull: Intact.  No focal lesion. Sinuses/Orbits: Negative. Other: None. IMPRESSION: No acute abnormality. Extensive chronic microvascular ischemic disease. Electronically Signed   By: Inge Rise M.D.   On: 12/16/2019 21:40   CT HEAD WO CONTRAST  Result Date: 12/01/2019 CLINICAL DATA:  Slurred speech x2 days. EXAM: CT HEAD WITHOUT CONTRAST TECHNIQUE: Contiguous axial images were obtained from the base of the skull through the vertex without intravenous contrast. COMPARISON:  Dec 19, 2017 FINDINGS: Brain: There is mild cerebral atrophy with widening of the extra-axial spaces and ventricular dilatation. There are areas of decreased attenuation within the white matter tracts of the supratentorial brain, consistent with microvascular disease changes. Small chronic bilateral basal ganglia lacunar infarcts are seen. 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 Skull: Normal. Negative for fracture or focal lesion. Sinuses/Orbits: No acute finding. Other: None. IMPRESSION: 1. Generalized cerebral atrophy. 2. No acute intracranial abnormality. 3. 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 4. Small chronic bilateral basal  ganglia lacunar infarcts. Electronically Signed   By: Virgina Norfolk M.D.   On: 12/01/2019 20:16   CT ANGIO NECK W OR WO CONTRAST  Result Date: 12/02/2019 CLINICAL DATA:  Carotid artery stenosis. Recent slurred speech and mental status changes. Basilar tip aneurysm. Left carotid stenosis by ultrasound. EXAM: CT ANGIOGRAPHY HEAD AND NECK TECHNIQUE: Multidetector CT imaging of the head and neck was performed using the standard protocol during bolus administration of intravenous contrast. Multiplanar CT image reconstructions and MIPs were obtained to evaluate the vascular anatomy. Carotid stenosis measurements (when applicable) are obtained utilizing NASCET criteria, using the distal internal carotid diameter as the denominator. CONTRAST:  41mL OMNIPAQUE IOHEXOL 350 MG/ML SOLN COMPARISON:  MRI same day. Ultrasound same day. MRI 08/26/2013. CT angiography 08/19/2013. FINDINGS: CT HEAD FINDINGS Brain: Brain atrophy with chronic small-vessel ischemic changes affecting the pons, thalami, basal ganglia and hemispheric white matter. No sign of acute infarction, mass lesion, hemorrhage, hydrocephalus or extra-axial collection. Vascular: There is atherosclerotic calcification of the major vessels at the base of the brain. 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 See below. Skull: Negative Sinuses: Clear sinuses. Orbits: Lens calcification on the right. Review of the MIP images confirms the above findings CTA NECK FINDINGS Aortic arch: Aortic atherosclerotic calcification. No aneurysm or dissection. Branching pattern is normal without flow limiting stenosis. Right carotid system: Common carotid artery widely patent to the bifurcation and ICA bulb. Somewhat irregular soft and calcified plaque affecting the ICA bulb with minimal diameter of 3.4 mm. Compared to a more distal cervical ICA diameter of 5 mm, this indicates a 30% stenosis. Vessels are tortuous, swinging to the midline behind the oropharynx. Left carotid system: Common  carotid artery is patent to the bifurcation. Severe irregular calcified plaque at the carotid bifurcation and ICA bulb. Luminal stenosis likely 1 mm. Compared to a more distal cervical ICA diameter of 5 mm, this indicates an 80% stenosis. Vessels are tortuous, swinging to the midline behind the oropharynx. Vertebral arteries: Calcified plaque at both vertebral artery origin regions but without stenosis greater than 30%. Scattered areas of calcified plaque through the cervical course of both vertebral arteries but no stenosis greater than 30%. Skeleton: Degenerative cervical spondylosis. Other neck: No mass or lymphadenopathy. Upper chest: Emphysema and pulmonary scarring at the apices. Review of the MIP images confirms the above findings CTA HEAD FINDINGS Anterior circulation: Both internal carotid arteries are patent through the skull base and siphon regions. Ordinary siphon atherosclerotic calcification but without stenosis greater than 30%. Anterior and middle cerebral vessels are patent without large or medium vessel occlusion, aneurysm or vascular malformation. Posterior circulation: Both vertebral arteries are patent through the foramen magnum. There is atherosclerotic disease at both vertebral artery V4 segments with stenosis estimated at 50% on both sides. Both vessels supply the basilar. No basilar stenosis. 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 Venous sinuses: Patent and normal. Anatomic variants: None significant. Review of the MIP images confirms the above findings IMPRESSION: No acute large or medium vessel occlusion. Aortic Atherosclerosis (ICD10-I70.0) and Emphysema (ICD10-J43.9). Atherosclerotic disease at both carotid bifurcation and ICA bulb regions. 30% ICA stenosis on the right. 80% or greater irregular stenosis of the ICA bulb on the left. Vessels are tortuous, swinging to the midline behind the oropharynx. Atherosclerotic disease of both  vertebral artery V4 segments with stenoses estimated at 50%. 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 Electronically Signed   By: Nelson Chimes M.D.   On: 12/02/2019 15:56   CT Angio Chest PE W and/or Wo Contrast  Result Date: 12/16/2019 CLINICAL DATA:  Shortness of breath, near syncope EXAM: CT ANGIOGRAPHY CHEST WITH CONTRAST TECHNIQUE: Multidetector CT imaging of the chest was performed using the standard protocol during bolus administration of intravenous contrast. Multiplanar CT image reconstructions and MIPs were obtained to evaluate the vascular anatomy. CONTRAST:  74mL OMNIPAQUE IOHEXOL 350 MG/ML SOLN COMPARISON:  06/30/2011, 12/05/2019 FINDINGS: Cardiovascular: This is a technically adequate evaluation of the pulmonary vasculature. No filling defects or pulmonary emboli. The heart is normal without pericardial effusion. There is extensive atherosclerosis of  the thoracic aorta, with no aneurysm or dissection. Significant atherosclerosis throughout the coronary vasculature greatest in the LAD distribution. Mediastinum/Nodes: No enlarged mediastinal, hilar, or axillary lymph nodes. Thyroid gland, trachea, and esophagus demonstrate no significant findings. Small hiatal hernia is noted. Lungs/Pleura: Upper lobe predominant emphysema is seen, with scattered areas of air trapping. No airspace disease, effusion, or pneumothorax. Central airways are patent. Upper Abdomen: No acute abnormality. Musculoskeletal: No acute or destructive bony lesions. Reconstructed images demonstrate no additional findings. Review of the MIP images confirms the above findings. IMPRESSION: 1. No CT evidence of pulmonary embolism. 2. Small hiatal hernia. 3. Aortic Atherosclerosis (ICD10-I70.0) and Emphysema (ICD10-J43.9). Electronically Signed   By: Randa Ngo M.D.   On: 12/16/2019 21:40   MR BRAIN WO CONTRAST  Result Date: 12/02/2019 CLINICAL DATA:  Neuro  deficit, acute, stroke suspected. Additional history provided: Acute onset slurred speech, headache, mild nausea without vomiting EXAM: MRI HEAD WITHOUT CONTRAST TECHNIQUE: Multiplanar, multiecho pulse sequences of the brain and surrounding structures were obtained without intravenous contrast. COMPARISON:  Noncontrast head CT 12/01/2019, noncontrast head CT 12/19/2017, brain MRI 08/26/2013, CT angiogram head 08/19/2013 FINDINGS: Brain: Multiple sequences are significantly motion degraded. Most notably there is moderate motion degradation of the axial and coronal diffusion-weighted sequence, moderate motion degradation of the axial SWI sequence, moderate motion degradation of the axial T1 weighted sequence, moderate/severe motion degradation of the axial T2 FLAIR sequence and moderate/severe motion degradation of the coronal T2 weighted sequence. Moderate to advanced patchy and confluent T2/FLAIR hyperintensity within the cerebral white matter is nonspecific, but consistent with chronic small vessel ischemic disease. Findings have progressed as compared to prior MRI 08/26/2013. Redemonstrated chronic lacunar infarcts within the bilateral basal ganglia. Also again seen, there are chronic small-vessel ischemic changes within the thalami and pons. Chronic microhemorrhages within the right frontal lobe, left periatrial region and left cerebellum not definitively present on a prior MRI. Stable, mild generalized parenchymal atrophy. There is no acute infarct. No evidence of intracranial mass. No extra-axial fluid collection. No midline shift. Vascular: Expected proximal arterial flow voids. 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 Skull and upper cervical spine: No focal marrow lesion Sinuses/Orbits: Visualized orbits show no acute finding. Mild ethmoid and right maxillary sinus mucosal thickening. No significant mastoid effusion. IMPRESSION:  1. Motion degraded examination as described. 2. No evidence of acute intracranial abnormality, including acute infarction. 3. Moderate to advanced chronic small vessel ischemic disease has progressed as compared to prior MRI 08/26/2013. Redemonstrated chronic lacunar infarcts within the bilateral basal ganglia. 4. Nonspecific chronic microhemorrhages within the right frontal lobe, left periatrial region and left cerebellum which were not definitively present on the prior MRI. 5. Stable, mild generalized parenchymal atrophy. 6. 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 Electronically Signed   By: Kellie Simmering DO   On: 12/02/2019 08:26   US Carotid Bilateral (at [facility] and [facility] only)  Result Date: 12/02/2019 CLINICAL DATA:  84 year old female with TIA EXAM: BILATERAL CAROTID DUPLEX ULTRASOUND TECHNIQUE: Pearline Cables scale imaging, color Doppler and duplex ultrasound were performed of bilateral carotid and vertebral arteries in the neck. COMPARISON:  None. FINDINGS: Criteria: Quantification of carotid stenosis is based on velocity parameters that correlate the residual internal carotid diameter with NASCET-based stenosis levels, using the diameter of the distal internal carotid lumen as the denominator for stenosis measurement. The following  velocity measurements were obtained: RIGHT ICA:  Systolic 99991111 cm/sec, Diastolic 21 cm/sec CCA:  69 cm/sec SYSTOLIC ICA/CCA RATIO:  2.0 ECA:  120 cm/sec LEFT ICA:  Systolic 0000000 cm/sec, Diastolic 21 cm/sec CCA:  75 cm/sec SYSTOLIC ICA/CCA RATIO:  3.8 ECA:  1 4 cm/sec Right Brachial SBP: Not acquired Left Brachial SBP: Not acquired RIGHT CAROTID ARTERY: No significant calcifications of the right common carotid artery. Intermediate waveform maintained. Moderate heterogeneous and partially calcified plaque at the right carotid bifurcation. No significant lumen shadowing. Low resistance waveform of the right ICA. Tortuosity  RIGHT VERTEBRAL ARTERY: Antegrade flow with low resistance waveform. LEFT CAROTID ARTERY: No significant calcifications of the left common carotid artery. Intermediate waveform maintained. Moderate heterogeneous and partially calcified plaque at the left carotid bifurcation. No significant lumen shadowing. Low resistance waveform of the left ICA. Tortuosity LEFT VERTEBRAL ARTERY:  Antegrade flow with low resistance waveform. IMPRESSION: Right: Color duplex indicates moderate heterogeneous and calcified plaque, with no hemodynamically significant stenosis by duplex criteria in the extracranial cerebrovascular circulation. Left: Heterogeneous and partially calcified plaque at the left carotid bifurcation, with discordant results regarding degree of stenosis by established duplex criteria. Peak velocity suggests 70% - 99% stenosis, with the ICA/ CCA ratio suggesting a lesser degree of stenosis. If establishing a more accurate degree of stenosis is required, cerebral angiogram should be considered, or as a second best test, CTA. Signed, Dulcy Fanny. Dellia Nims, RPVI [facility] Radiology Electronically Signed   By: Corrie Mckusick D.O.   On: 12/02/2019 11:33   PERIPHERAL VASCULAR CATHETERIZATION  Result Date: 12/04/2019 See op note  DG Chest Port 1 View  Result Date: 12/21/2019 CLINICAL DATA:  History of COPD. Progressive dyspnea on exertion. EXAM: PORTABLE CHEST 1 VIEW COMPARISON:  Chest CT 12/19/2019 FINDINGS: Normal heart size. Tortuous calcified thoracic aorta. Chronic bronchitic changes. No pleural effusion or edema. Remote right posterior rib fractures. Both lungs are clear. The visualized skeletal structures are unremarkable. IMPRESSION: No acute cardiopulmonary abnormalities. Aortic Atherosclerosis (ICD10-I70.0). Electronically Signed   By: Kerby Moors M.D.   On: 12/21/2019 08:35   DG Chest Port 1 View  Result Date: 12/05/2019 CLINICAL DATA:  Shortness of  breath. EXAM: PORTABLE CHEST 1 VIEW COMPARISON:  12/02/2019 FINDINGS: Heart size remains enlarged. Calcification of the thoracic aorta is similar to the prior study. No consolidation or sign of pleural effusion. Slight increase in density along the paraspinal region likely reflects hiatal hernia. Spinal degenerative changes. No acute or destructive bone process. IMPRESSION: 1. No active cardiopulmonary disease. 2. Stable cardiomegaly and aortic atherosclerosis. Electronically Signed   By: Zetta Bills M.D.   On: 12/05/2019 10:42   DG Chest Portable 1 View  Result Date: 12/02/2019 CLINICAL DATA:  Slurred speech x2 days. EXAM: PORTABLE CHEST 1 VIEW COMPARISON:  January 26, 2014 FINDINGS: Mild diffuse chronic appearing increased lung markings are seen. There is no evidence of acute infiltrate, pleural effusion or pneumothorax. The cardiac silhouette is moderately enlarged. There is marked severity calcification of the aortic arch. Multilevel degenerative changes seen throughout the thoracic spine. IMPRESSION: Chronic-appearing increased lung markings without evidence of acute or active cardiopulmonary disease. Electronically Signed   By: Virgina Norfolk M.D.   On: 12/02/2019 00:33   ECHOCARDIOGRAM COMPLETE  Result Date: 12/03/2019    ECHOCARDIOGRAM REPORT   Patient Name:   Toni White Date of Exam: 12/02/2019 Medical Rec #:  [id number]     Height:       61.0  in Accession #:    [id number]    Weight:       169.0 lb Date of Birth:  Feb 20, 1936      BSA:          1.758 m Patient Age:    49 years      BP:           156/69 mmHg Patient Gender: F             HR:           70 bpm. Exam Location:  [facility] Procedure: 2D Echo, Cardiac Doppler and Color Doppler Indications:     TIA 435.9  History:         Patient has no prior history of Echocardiogram examinations.                  COPD; Risk Factors:Hypertension.  Sonographer:     Sherrie Sport RDCS (AE) Referring Phys:  [id number] Arvella Merles MANSY Diagnosing Phys: Yolonda Kida MD  IMPRESSIONS  1. Left ventricular ejection fraction, by estimation, is 65 to 70%. The left ventricle has normal function. The left ventricle has no regional wall motion abnormalities. There is mild concentric left ventricular hypertrophy. Left ventricular diastolic parameters are consistent with Grade I diastolic dysfunction (impaired relaxation).  2. Right ventricular systolic function is moderately reduced. The right ventricular size is moderately enlarged. Mildly increased right ventricular wall thickness. There is normal pulmonary artery systolic pressure.  3. The mitral valve is normal in structure. No evidence of mitral valve regurgitation.  4. The aortic valve is grossly normal. Aortic valve regurgitation is not visualized. FINDINGS  Left Ventricle: Left ventricular ejection fraction, by estimation, is 65 to 70%. The left ventricle has normal function. The left ventricle has no regional wall motion abnormalities. The left ventricular internal cavity size was normal in size. There is  mild concentric left ventricular hypertrophy. Left ventricular diastolic parameters are consistent with Grade I diastolic dysfunction (impaired relaxation). Right Ventricle: The right ventricular size is moderately enlarged. Mildly increased right ventricular wall thickness. Right ventricular systolic function is moderately reduced. There is normal pulmonary artery systolic pressure. The tricuspid regurgitant velocity is 1.81 m/s, and with an assumed right atrial pressure of 10 mmHg, the estimated right ventricular systolic pressure is [id number] mmHg. Left Atrium: Left atrial size was normal in size. Right Atrium: Right atrial size was normal in size. Pericardium: Trivial pericardial effusion is present. Mitral Valve: The mitral valve is normal in structure. No evidence of mitral valve regurgitation. Tricuspid Valve: The tricuspid valve is normal in structure. Tricuspid valve regurgitation is trivial. Aortic Valve: The aortic valve is  grossly normal. Aortic valve regurgitation is not visualized. Aortic valve mean gradient measures 3.0 mmHg. Aortic valve peak gradient measures 5.5 mmHg. Aortic valve area, by VTI measures 3.64 cm. Pulmonic Valve: The pulmonic valve was grossly normal. Pulmonic valve regurgitation is not visualized. Aorta: The aortic root is normal in size and structure. IAS/Shunts: No atrial level shunt detected by color flow Doppler.  LEFT VENTRICLE PLAX 2D LVIDd:         3.50 cm  Diastology LVIDs:         2.14 cm  LV e' lateral:   6.42 cm/s LV PW:         0.91 cm  LV E/e' lateral: 16.2 LV IVS:        1.21 cm  LV e' medial:    6.31 cm/s LVOT diam:  2.00 cm  LV E/e' medial:  16.5 LV SV:         82 LV SV Index:   47 LVOT Area:     3.14 cm  RIGHT VENTRICLE RV Basal diam:  2.26 cm LEFT ATRIUM             Index       RIGHT ATRIUM           Index LA diam:        3.40 cm 1.93 cm/m  RA Area:     11.70 cm LA Vol (A2C):   33.6 ml 19.11 ml/m RA Volume:   23.20 ml  13.19 ml/m LA Vol (A4C):   33.9 ml 19.28 ml/m LA Biplane Vol: 33.6 ml 19.11 ml/m  AORTIC VALVE                   PULMONIC VALVE AV Area (Vmax):    3.11 cm    PV Vmax:       0.70 m/s AV Area (Vmean):   3.72 cm    PV Peak grad:  2.0 mmHg AV Area (VTI):     3.64 cm AV Vmax:           117.00 cm/s AV Vmean:          79.400 cm/s AV VTI:            0.225 m AV Peak Grad:      5.5 mmHg AV Mean Grad:      3.0 mmHg LVOT Vmax:         116.00 cm/s LVOT Vmean:        93.900 cm/s LVOT VTI:          0.261 m LVOT/AV VTI ratio: 1.16  AORTA Ao Root diam: 3.10 cm MITRAL VALVE                TRICUSPID VALVE MV Area (PHT): 2.18 cm     TR Peak grad:   13.1 mmHg MV Decel Time: 348 msec     TR Vmax:        181.00 cm/s MV E velocity: 104.00 cm/s MV A velocity: 135.00 cm/s  SHUNTS MV E/A ratio:  0.77         Systemic VTI:  0.26 m                             Systemic Diam: 2.00 cm Dwayne D Callwood MD Electronically signed by Yolonda Kida MD Signature Date/Time: 12/03/2019/6:39:32 AM     Final         ASSESSMENT/PLAN   Moderate Acute exacerbation of COPD  -on xopenex  -changed prednisone 20 po daily - 12/21/19  - d/cd Daliresp 573mcg daily-12/17/19 - continue zithromax 500mg   po daily- noted Roephin added due to encephalopathy and leukocytosis -appreciate collaboration  -MetaNEB q4h- increased frequency to QID - 12/20/19  -  Mucomyst 90ml 20% BID-Finished 12/18/19  - IS at bedside -continue PT/OT -reviewed CXR from today 12/21/19- worsening interstitial opacification with cephalization compared to yesterday, consistent with mild pulmonary edema will initiate lasix.  BNP today. RT to wean O2 with goal spO2  88-92 on 2L/min supplemental O2     Acute on Chronic hypoxemic respiratory failure  - patient is on 4L/min [place] and O2 saturation is >95% Suspect atelectasis will incrase recruitment maneuvers.   - she is on home settings at this time  - recommend  PT/OT and early mobilization    Thank you for allowing me to participate in the care of this patient.   Patient/Family are satisfied with care plan and all questions have been answered.  This document was prepared using Dragon voice recognition software and may include unintentional dictation errors.     Ottie Glazier, M.D.  Division of [place]

## 2019-12-21 NOTE — NC FL2 (Signed)
[place] LEVEL OF CARE SCREENING TOOL     IDENTIFICATION  Patient Name: Toni White Birthdate: 17-Dec-1935 Sex: female Admission Date (Current Location): 12/16/2019  [place] and [place] Number:  Engineering geologist and Address:  [facility], [address], [place], [place] [address]      Provider Number:    Attending Physician Name and Address:  Edwin Dada, *  Relative Name and Phone Number:  [name] [phone]    Current Level of Care: Hospital Recommended Level of Care: [place] Prior Approval Number:    Date Approved/Denied:   PASRR Number: [id number]  Discharge Plan: SNF    Current Diagnoses: Patient Active Problem List   Diagnosis Date Noted  . Near syncope 12/17/2019  . COPD exacerbation ([place]) 12/17/2019  . CVA (cerebral vascular accident) ([place]) 12/02/2019  . Aphasia 12/02/2019  . Dysarthria   . Carotid stenosis, left   . Chronic respiratory failure with hypoxia ([place])   . TIA (transient ischemic attack)   . Varicose veins of both legs with edema 05/02/2017  . Recurrent major depressive disorder, in partial remission ([place]) 06/08/2015  . Hypersomnolence 06/08/2015  . Macular degeneration 06/08/2015  . Sciatica 06/08/2015  . LBP (low back pain) 06/08/2015  . OP (osteoporosis) 06/08/2015  . Bibasilar crackles 06/08/2015  . Edema extremities 06/08/2015  . Tremor 03/17/2015  . 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 Diffuse cystic mastopathy 06/26/2013  . Myalgia and myositis 07/06/2009  . Hypoxemia 07/06/2009  . Hyperlipidemia 05/19/2008  . Prediabetes 12/18/2007  . One eye: total vision impairment; other eye: normal vision 06/29/2007  . Hypertension 08/07/1998  . Chronic obstructive pulmonary disease ([place]) 08/07/1998    Orientation RESPIRATION BLADDER Height & Weight     Place, Time, Self  Normal Continent Weight: 162 lb 7.7 oz (73.7 kg) Height:  5\' 1"  (154.9 cm)   BEHAVIORAL SYMPTOMS/MOOD NEUROLOGICAL BOWEL NUTRITION STATUS  Other (Comment)(none)   Continent Diet  AMBULATORY STATUS COMMUNICATION OF NEEDS Skin   Extensive Assist Verbally Normal                       Personal Care Assistance Level of Assistance  Bathing, Feeding, Dressing Bathing Assistance: Limited assistance Feeding assistance: Limited assistance Dressing Assistance: Limited assistance     Functional Limitations Info  Sight, Hearing, Speech Sight Info: Adequate Hearing Info: Adequate Speech Info: Adequate    SPECIAL CARE FACTORS FREQUENCY  PT (By licensed PT), OT (By licensed OT)     PT Frequency: 5x week OT Frequency: 5x week            Contractures Contractures Info: Not present    Additional Factors Info  Code Status Code Status Info: DNR             Current Medications (12/21/2019):  This is the current hospital active medication list Current Facility-Administered Medications  Medication Dose Route Frequency Provider Last Rate Last Admin  . acetaminophen (TYLENOL) tablet 650 mg  650 mg Oral Q6H PRN Mansy, Jan A, MD       Or  . acetaminophen (TYLENOL) suppository 650 mg  650 mg Rectal Q6H PRN Mansy, Jan A, MD      . alum & mag hydroxide-simeth (MAALOX/MYLANTA) 200-200-20 MG/5ML suspension 30 mL  30 mL Oral Q6H PRN Mansy, Jan A, MD      . amLODipine (NORVASC) tablet 5 mg  5 mg Oral Daily Mansy, Arvella Merles, MD  5 mg at 12/21/19 0931  . aspirin chewable tablet 81 mg  81 mg Oral Daily Mansy, Jan A, MD   81 mg at 12/21/19 0932  . cefTRIAXone (ROCEPHIN) 1 g in sodium chloride 0.9 % 100 mL IVPB  1 g Intravenous Q24H Danford, Suann Larry, MD      . citalopram (CELEXA) tablet 20 mg  20 mg Oral Daily Mansy, Jan A, MD   20 mg at 12/21/19 0931  . clopidogrel (PLAVIX) tablet 75 mg  75 mg Oral Daily Mansy, Jan A, MD   75 mg at 12/21/19 0931  . enoxaparin (LOVENOX) injection 40 mg  40 mg Subcutaneous Q24H Mansy, Jan A, MD   40 mg at 12/21/19 0930  . gabapentin  (NEURONTIN) capsule 100 mg  100 mg Oral BID Mansy, Jan A, MD   100 mg at 12/21/19 0939  . levalbuterol (XOPENEX) nebulizer solution 0.63 mg  0.63 mg Nebulization Q6H PRN Edwin Dada, MD      . levothyroxine (SYNTHROID) tablet 37.5 mcg  37.5 mcg Oral Q0600 Mansy, Jan A, MD   37.5 mcg at 12/21/19 0630  . magnesium hydroxide (MILK OF MAGNESIA) suspension 30 mL  30 mL Oral Daily PRN Mansy, Jan A, MD      . metoprolol succinate (TOPROL-XL) 24 hr tablet 25 mg  25 mg Oral Daily Danford, Suann Larry, MD   25 mg at 12/21/19 0931  . multivitamin-lutein (OCUVITE-LUTEIN) capsule 1 capsule  1 capsule Oral BID Mansy, Jan A, MD   1 capsule at 12/21/19 0932  . ondansetron (ZOFRAN) tablet 4 mg  4 mg Oral Q6H PRN Mansy, Jan A, MD       Or  . ondansetron [facility]) injection 4 mg  4 mg Intravenous Q6H PRN Mansy, Jan A, MD      . potassium chloride SA (KLOR-CON) CR tablet 40 mEq  40 mEq Oral Once Edwin Dada, MD      . pravastatin (PRAVACHOL) tablet 40 mg  40 mg Oral QHS Mansy, Jan A, MD   40 mg at 12/20/19 2155  . predniSONE (DELTASONE) tablet 30 mg  30 mg Oral Q breakfast Ottie Glazier, MD   30 mg at 12/21/19 0931  . sodium chloride flush (NS) 0.9 % injection 3 mL  3 mL Intravenous Once Duffy Bruce, MD      . sodium chloride flush (NS) 0.9 % injection 3 mL  3 mL Intravenous Q12H Mansy, Jan A, MD   3 mL at 12/21/19 [id number]     Discharge Medications: Please see discharge summary for a list of discharge medications.  Relevant Imaging Results:  Relevant Lab Results:   Additional Information [place], [place]

## 2019-12-21 NOTE — TOC Initial Note (Signed)
Transition of Care [facility]) - Initial/Assessment Note    Patient Details  Name: Toni White MRN: [id number] Date of Birth: 1936/05/31  Transition of Care [facility]) CM/SW Contact:    Boris Sharper, LCSW Phone Number: 12/21/2019, 3:26 PM  Clinical Narrative:                 CSW notified pt's daughter of PT recommendations. Pt's daughter was agreeable to SNF placement. PT's daughter stated that the pt has become more confused and has not been getting better. CSW explaind the process of pt going to SNF and daughter was grateful. CSW completed FL2, PASRR screening and faxed out to surrounding facilities.   TOC will continue to follow.   Expected Discharge Plan: Skilled Nursing Facility Barriers to Discharge: Continued Medical Work up   Patient Goals and CMS Choice Patient states their goals for this hospitalization and ongoing recovery are:: daughter stated to get rehab CMS Medicare.gov Compare Post Acute Care list provided to:: Patient Represenative (must comment) Choice offered to / list presented to : Adult Children  Expected Discharge Plan and Services Expected Discharge Plan: [place] Choice: [place] arrangements for the past 2 months: Single Family Home                                      Prior Living Arrangements/Services Living arrangements for the past 2 months: Single Family Home Lives with:: Adult Children Patient language and need for interpreter reviewed:: Yes          Care giver support system in place?: No (comment)   Criminal Activity/Legal Involvement Pertinent to Current Situation/Hospitalization: No - Comment as needed  Activities of Daily Living Home Assistive Devices/Equipment: Eyeglasses, Environmental consultant (specify type) ADL Screening (condition at time of admission) Patient's cognitive ability adequate to safely complete daily activities?: Yes Is the patient deaf or have difficulty hearing?:  Yes Does the patient have difficulty seeing, even when wearing glasses/contacts?: Yes Does the patient have difficulty concentrating, remembering, or making decisions?: No Patient able to express need for assistance with ADLs?: Yes Does the patient have difficulty dressing or bathing?: No Independently performs ADLs?: No Communication: Independent Dressing (OT): Needs assistance Is this a change from baseline?: Pre-admission baseline Grooming: Needs assistance Is this a change from baseline?: Pre-admission baseline Feeding: Independent Is this a change from baseline?: Pre-admission baseline Bathing: Needs assistance Is this a change from baseline?: Pre-admission baseline Toileting: Needs assistance Is this a change from baseline?: Pre-admission baseline In/Out Bed: Needs assistance Is this a change from baseline?: Pre-admission baseline Walks in Home: Needs assistance Is this a change from baseline?: Pre-admission baseline Does the patient have difficulty walking or climbing stairs?: Yes Weakness of Legs: Both Weakness of Arms/Hands: None  Permission Sought/Granted Permission sought to share information with : Facility Art therapist granted to share information with : Yes, Verbal Permission Granted  Share Information with NAME: [name]     Permission granted to share info w Relationship: daughter  Permission granted to share info w Contact Information: [phone]  Emotional Assessment Appearance:: Other (Comment Required(unable to assess) Attitude/Demeanor/Rapport: Unable to Assess Affect (typically observed): Unable to Assess Orientation: : Oriented to Self, Oriented to Place, Oriented to  Time   Psych Involvement: No (comment)  Admission diagnosis:  Syncope [R55] COPD exacerbation ([place]) [J44.1] Near syncope [R55] Patient Active Problem List  Diagnosis Date Noted  . Near syncope 12/17/2019  . COPD exacerbation ([place]) 12/17/2019  . CVA (cerebral  vascular accident) ([place]) 12/02/2019  . Aphasia 12/02/2019  . Dysarthria   . Carotid stenosis, left   . Chronic respiratory failure with hypoxia ([place])   . TIA (transient ischemic attack)   . Varicose veins of both legs with edema 05/02/2017  . Recurrent major depressive disorder, in partial remission ([place]) 06/08/2015  . Hypersomnolence 06/08/2015  . Macular degeneration 06/08/2015  . Sciatica 06/08/2015  . LBP (low back pain) 06/08/2015  . OP (osteoporosis) 06/08/2015  . Bibasilar crackles 06/08/2015  . Edema extremities 06/08/2015  . Tremor 03/17/2015  . 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 Diffuse cystic mastopathy 06/26/2013  . Myalgia and myositis 07/06/2009  . Hypoxemia 07/06/2009  . Hyperlipidemia 05/19/2008  . Prediabetes 12/18/2007  . One eye: total vision impairment; other eye: normal vision 06/29/2007  . Hypertension 08/07/1998  . Chronic obstructive pulmonary disease ([place]) 08/07/1998   PCP:  Birdie Sons, MD Pharmacy:   [place] [place] [address] [place] [address] Phone: [phone] Fax: [phone]  [facility] [place], [place] [address] Phone: [phone] Fax: [phone]     Social Determinants of Health (SDOH) Interventions    Readmission Risk Interventions No flowsheet data found.

## 2019-12-21 NOTE — Plan of Care (Signed)

## 2019-12-21 NOTE — Progress Notes (Addendum)
PROGRESS NOTE    Toni White  [id number] DOB: 01/31/1936 DOA: 12/16/2019 PCP: Birdie Sons, MD      Brief Narrative:  Toni White is a 84 y.o. F with hx COPD on home O2 2L, FEV1 22%, recent stroke, and HTN who presented with few days progressive DOE and then pre-syncope.  Patient was getting ready to get in the car, and slumped to the ground weak, nearly passed out.  In the ER, ECG and troponins normal (there was an initial spurious elevated troponin, repeats both normal).  CTA chest showed no pneumonia or PE.        Assessment & Plan:  Acute on chronic hypoxic respiratory failure due to COPD exacerbation atelectasis This is no better.  Patient still only able to walk a few steps, still desaturates.    -Continue prednisone -Continue Xopenex with MetaNebs -Continue azithromycin, day 5 of 5    Possible community-acquired pneumonia Patient's white count rising, possibly steroid-induced, but also her procalcitonin is up. -Start ceftriaxone, day 2 -Continue azithromycin day 5 of 5  Acute metabolic encephalopathy Fluctuating, seems okay this morning.  Overall poor prognostic sign.  Pre-syncope Due to orthostasis.  Fluids given, orthostasis resolved.  Hypokalemia Repleted  Hypertension BP slightly elevated -Continue amlodipine and metoprolol  Recent TIA -continue aspirin and Plavix  Mood -Continue citalopram  Peripheral neuropathy -Continue gabapentin  Hypothyroidism -Continue levothyroxine       Disposition: Status is: Inpatient  The patient requires ongoing hospital level care due to her persistent severity of symptoms, severe dyspnea with minimal exertion, considerably worse than baseline.    Dispo: The patient is from: Home              Anticipated d/c is to: Possibly SNF              Anticipated d/c date is: Unclear, possibly few more days                                    MDM: The below labs and imaging reports  reviewed and summarized above.  Medication management as above.  Severe exacerbation of her chronic disease.  DVT prophylaxis: Lovenox Code Status: DNR Family Communication: Daughter by phone last night    Consultants:   Pulmonology  Procedures:     Antimicrobials:   Azithromycin 5/12 >>  Ceftriaxone 5/15>>  Culture data:              Subjective: The patient reports no complaints.  She has been afebrile.  However she is extremely listless, tired, weak.  She is only able to stand briefly.  No vomiting, diarrhea.       Objective: Vitals:   12/20/19 1700 12/21/19 0014 12/21/19 0500 12/21/19 0723  BP: (!) 144/58 (!) 138/97  (!) 146/95  Pulse: 92 92  88  Resp: 19 19  17   Temp: 97.9 F (36.6 C) 98.1 F (36.7 C)  98.1 F (36.7 C)  TempSrc: Axillary   Oral  SpO2: 100% 100%  99%  Weight:   73.7 kg   Height:        Intake/Output Summary (Last 24 hours) at 12/21/2019 1045 Last data filed at 12/20/2019 1900 Gross per 24 hour  Intake --  Output 700 ml  Net -700 ml   Filed Weights   12/16/19 1454 12/20/19 0500 12/21/19 0500  Weight: 77.1 kg 71.3 kg 73.7 kg    Examination:  General appearance: Frail elderly female, sitting in bed, appears debilitated and weak.  No obvious distress.     HEENT: Anicteric, conjunctival seem normal, lids and lashes normal.  No nasal deformity, discharge, or epistaxis.  Nasal cannula in place. Skin: No suspicious rashes or lesions. Cardiac: Heart rate slightly elevated, regular, no murmurs, JVP normal, no lower extremity edema Respiratory: Tachypneic at rest, lung sounds diminished bilaterally, scant wheezing appreciated, very diminished.  Diminished. Abdomen: No tenderness palpation or guarding, no hepatosplenomegaly. MSK: Diminished muscle mass diffusely, diminished subcutaneous fat. Neuro: Awake and alert, attempts to answer questions, generally weak, oriented to self only.  Speech fluent. Psych: Affect blunted, judgment  insight appear to be impaired by dementia t.       Data Reviewed: I have personally reviewed following labs and imaging studies:  CBC: Recent Labs  Lab 12/17/19 0626 12/17/19 0922 12/19/19 0411 12/20/19 0729 12/21/19 0633  WBC 13.3* 14.7* 18.1* 25.6* 17.8*  HGB 13.1 11.9* 12.1 12.4 12.3  HCT 39.7 36.6 37.7 38.6 38.4  MCV 91.3 92.4 92.6 92.8 92.8  PLT 345 367 484* 392 0000000   Basic Metabolic Panel: Recent Labs  Lab 12/16/19 2311 12/17/19 0626 12/19/19 0411 12/20/19 0729 12/21/19 0633  NA 140 141 145 145 147*  K 3.8 3.8 2.9* 3.3* 3.7  CL 97* 100 102 106 106  CO2 31 29 29 31 30   GLUCOSE 100* 88 96 105* 94  BUN 10 11 22 20  24*  CREATININE 0.58 0.53 0.58 0.41* 0.38*  CALCIUM 9.6 9.4 9.1 8.9 9.0  MG  --   --  1.8  --   --    GFR: Estimated Creatinine Clearance: 50.7 mL/min (A) (by C-G formula based on SCr of 0.38 mg/dL (L)). Liver Function Tests: No results for input(s): AST, ALT, ALKPHOS, BILITOT, PROT, ALBUMIN in the last 168 hours. No results for input(s): LIPASE, AMYLASE in the last 168 hours. No results for input(s): AMMONIA in the last 168 hours. Coagulation Profile: Recent Labs  Lab 12/17/19 0737  INR 1.1   Cardiac Enzymes: No results for input(s): CKTOTAL, CKMB, CKMBINDEX, TROPONINI in the last 168 hours. BNP (last 3 results) No results for input(s): PROBNP in the last 8760 hours. HbA1C: No results for input(s): HGBA1C in the last 72 hours. CBG: Recent Labs  Lab 12/17/19 0611 12/18/19 0457 12/20/19 0508 12/21/19 0530 12/21/19 0723  GLUCAP 82 140* 104* 88 87   Lipid Profile: No results for input(s): CHOL, HDL, LDLCALC, TRIG, CHOLHDL, LDLDIRECT in the last 72 hours. Thyroid Function Tests: No results for input(s): TSH, T4TOTAL, FREET4, T3FREE, THYROIDAB in the last 72 hours. Anemia Panel: No results for input(s): VITAMINB12, FOLATE, FERRITIN, TIBC, IRON, RETICCTPCT in the last 72 hours. Urine analysis:    Component Value Date/Time    COLORURINE YELLOW (A) 12/16/2019 1504   APPEARANCEUR HAZY (A) 12/16/2019 1504   APPEARANCEUR Hazy 01/26/2014 1040   LABSPEC 1.030 12/16/2019 1504   LABSPEC 1.017 01/26/2014 1040   PHURINE 6.0 12/16/2019 1504   GLUCOSEU NEGATIVE 12/16/2019 1504   GLUCOSEU Negative 01/26/2014 1040   HGBUR NEGATIVE 12/16/2019 1504   BILIRUBINUR NEGATIVE 12/16/2019 1504   BILIRUBINUR Negative 01/26/2014 1040   KETONESUR 20 (A) 12/16/2019 1504   PROTEINUR NEGATIVE 12/16/2019 1504   NITRITE NEGATIVE 12/16/2019 1504   LEUKOCYTESUR SMALL (A) 12/16/2019 1504   LEUKOCYTESUR Negative 01/26/2014 1040   Sepsis Labs: @LABRCNTIP (procalcitonin:4,lacticacidven:4)  ) Recent Results (from the past 240 hour(s))  SARS Coronavirus 2 by RT PCR (hospital order, performed in  [facility] hospital lab) Nasopharyngeal Nasopharyngeal Swab     Status: None   Collection Time: 12/16/19  9:14 PM   Specimen: Nasopharyngeal Swab  Result Value Ref Range Status   SARS Coronavirus 2 NEGATIVE NEGATIVE Final    Comment: (NOTE) SARS-CoV-2 target nucleic acids are NOT DETECTED. The SARS-CoV-2 RNA is generally detectable in upper and lower respiratory specimens during the acute phase of infection. The lowest concentration of SARS-CoV-2 viral copies this assay can detect is 250 copies / mL. A negative result does not preclude SARS-CoV-2 infection and should not be used as the sole basis for treatment or other patient management decisions.  A negative result may occur with improper specimen collection / handling, submission of specimen other than nasopharyngeal swab, presence of viral mutation(s) within the areas targeted by this assay, and inadequate number of viral copies (<250 copies / mL). A negative result must be combined with clinical observations, patient history, and epidemiological information. Fact Sheet for Patients:   StrictlyIdeas.no Fact Sheet for Healthcare  Providers: BankingDealers.co.za This test is not yet approved or cleared  by the [place] FDA and has been authorized for detection and/or diagnosis of SARS-CoV-2 by FDA under an Emergency Use Authorization (EUA).  This EUA will remain in effect (meaning this test can be used) for the duration of the COVID-19 declaration under Section 564(b)(1) of the Act, 21 U.S.C. section 360bbb-3(b)(1), unless the authorization is terminated or revoked sooner. Performed at [facility], [address], [place], [place] [address]          Radiology Studies: DG Chest 2 View  Result Date: 12/19/2019 CLINICAL DATA:  Cough, COPD, hypertension. EXAM: CHEST - 2 VIEW COMPARISON:  Chest x-rays dated 12/05/2019 and 12/02/2019. FINDINGS: Stable cardiomegaly. Lungs are clear. No pleural effusion or pneumothorax is seen. Probable hiatal hernia. No acute appearing osseous abnormality. IMPRESSION: 1. No active cardiopulmonary disease. No evidence of pneumonia or pulmonary edema. 2. Stable cardiomegaly. 3. Probable hiatal hernia. Electronically Signed   By: Franki Cabot M.D.   On: 12/19/2019 13:13   DG Chest Port 1 View  Result Date: 12/21/2019 CLINICAL DATA:  History of COPD. Progressive dyspnea on exertion. EXAM: PORTABLE CHEST 1 VIEW COMPARISON:  Chest CT 12/19/2019 FINDINGS: Normal heart size. Tortuous calcified thoracic aorta. Chronic bronchitic changes. No pleural effusion or edema. Remote right posterior rib fractures. Both lungs are clear. The visualized skeletal structures are unremarkable. IMPRESSION: No acute cardiopulmonary abnormalities. Aortic Atherosclerosis (ICD10-I70.0). Electronically Signed   By: Kerby Moors M.D.   On: 12/21/2019 08:35        Scheduled Meds: . amLODipine  5 mg Oral Daily  . aspirin  81 mg Oral Daily  . citalopram  20 mg Oral Daily  . clopidogrel  75 mg Oral Daily  . enoxaparin (LOVENOX) injection  40 mg Subcutaneous Q24H  .  gabapentin  100 mg Oral BID  . levothyroxine  37.5 mcg Oral Q0600  . metoprolol succinate  25 mg Oral Daily  . multivitamin-lutein  1 capsule Oral BID  . potassium chloride  40 mEq Oral Once  . pravastatin  40 mg Oral QHS  . predniSONE  30 mg Oral Q breakfast  . sodium chloride flush  3 mL Intravenous Once  . sodium chloride flush  3 mL Intravenous Q12H   Continuous Infusions: . cefTRIAXone (ROCEPHIN)  IV       LOS: 3 days    Time spent: 35 minutes    Edwin Dada, MD [facility] 12/21/2019, 10:45 AM     Please page though [place] or Epic secure chat:  For password, contact charge nurse

## 2019-12-22 ENCOUNTER — Encounter (INDEPENDENT_AMBULATORY_CARE_PROVIDER_SITE_OTHER): Payer: Medicare HMO

## 2019-12-22 ENCOUNTER — Ambulatory Visit (INDEPENDENT_AMBULATORY_CARE_PROVIDER_SITE_OTHER): Payer: Medicare HMO | Admitting: Nurse Practitioner

## 2019-12-22 LAB — CBC
HCT: 41.5 % (ref 36.0–46.0)
Hemoglobin: 13.1 g/dL (ref 12.0–15.0)
MCH: 29 pg (ref 26.0–34.0)
MCHC: 31.6 g/dL (ref 30.0–36.0)
MCV: 91.8 fL (ref 80.0–100.0)
Platelets: 442 10*3/uL — ABNORMAL HIGH (ref 150–400)
RBC: 4.52 MIL/uL (ref 3.87–5.11)
RDW: 13.3 % (ref 11.5–15.5)
WBC: 15.8 10*3/uL — ABNORMAL HIGH (ref 4.0–10.5)
nRBC: 0 % (ref 0.0–0.2)

## 2019-12-22 LAB — BASIC METABOLIC PANEL
Anion gap: 12 (ref 5–15)
BUN: 25 mg/dL — ABNORMAL HIGH (ref 8–23)
CO2: 32 mmol/L (ref 22–32)
Calcium: 9.3 mg/dL (ref 8.9–10.3)
Chloride: 102 mmol/L (ref 98–111)
Creatinine, Ser: 0.52 mg/dL (ref 0.44–1.00)
GFR calc Af Amer: >60 mL/min (ref >60)
GFR calc non Af Amer: >60 mL/min (ref >60)
Glucose, Bld: 103 mg/dL — ABNORMAL HIGH (ref 70–99)
Potassium: 3.3 mmol/L — ABNORMAL LOW (ref 3.5–5.1)
Sodium: 146 mmol/L — ABNORMAL HIGH (ref 135–145)

## 2019-12-22 LAB — GLUCOSE, CAPILLARY: Glucose-Capillary: 106 mg/dL — ABNORMAL HIGH (ref 70–99)

## 2019-12-22 MED ORDER — POTASSIUM CHLORIDE CRYS ER 20 MEQ PO TBCR: 20.0000 meq | EXTENDED_RELEASE_TABLET | Freq: Two times a day (BID) | ORAL | Status: DC

## 2019-12-22 MED ORDER — POTASSIUM CL IN DEXTROSE 5% 20 MEQ/L IV SOLN
20.0000 meq | INTRAVENOUS | Status: DC
  Administered 2019-12-22: 20 meq via INTRAVENOUS
  Filled 2019-12-22: qty 1000

## 2019-12-22 MED ORDER — POTASSIUM CHLORIDE CRYS ER 20 MEQ PO TBCR
20.0000 meq | EXTENDED_RELEASE_TABLET | Freq: Once | ORAL | Status: AC
  Administered 2019-12-22: 20 meq via ORAL
  Filled 2019-12-22: qty 1

## 2019-12-22 NOTE — Progress Notes (Addendum)
PROGRESS NOTE    Toni White  [id number] DOB: 11-23-1935 DOA: 12/16/2019 PCP: Birdie Sons, MD      Brief Narrative:  Toni White is a 84 y.o. F with hx COPD on home O2 2L, FEV1 22%, recent stroke, and HTN who presented with few days progressive DOE and then pre-syncope.  Patient was getting ready to get in the car, and slumped to the ground weak, nearly passed out.  In the ER, ECG and troponins normal (there was an initial spurious elevated troponin, repeats both normal).  CTA chest showed no pneumonia or PE.        Assessment & Plan:  Acute on chronic hypoxic respiratory failure due to COPD exacerbation and atelectasis Patient was admitted started on steroids and bronchodilators.  Pulmonology were consulted, added Zithromax, MetaNebs, Mucomyst.  The patient unfortunately developed worsening hypoxemia at rest.  There was some degree of acute pulmonary edema treated with Lasix.  Follow up CXR clear.  Subsequently, patient with minimal improvement in respiratory status despite steroids, bronchodilators, aggressive chest PT with MetaNebs.  I agree with pulmonology, likely this is the recruitment of alveoli due to inactivity post stroke.  Overall this is a very poor prognostic factor I feel.  -Continue prednisone taper -Continue Xopenex, MetaNebs per Pulmonology    Possible community-acquired pneumonia Patient developed new leukocytosis, procalcitonin rose to 0.16, started on Ceftriaxone.    White count, procalcitonin now trending back down on antibiotics.  However no overall clinical improvement -Continue ceftriaxone, day 3  Acute metabolic encephalopathy The patient had some acute encephalopathy at admission.  This was fluctuating for several days, but now seems progressing.    She has no significant metabolic derangements/electrolyte derangements, she has no significant underlying infection.  We suspect that this is all from her chronic respiratory failure and  recent stroke and that there is limited improvement expected.  Her overall prognosis is worsening.  Appears to be failing to thrive. -Consult palliative care  Hypernatremia The patient is able to eat only a few bites of food with each meal. -Start gentle hypotonic fluids  Pre-syncope This was the initial presenting complaint, patient orthostatic on arrival.  Given fluids, metoprolol held, orthostatics resolved.  Hypertension -Continue amlodipine and metoprolol  Recent stroke -continue aspirin and Plavix  Mood -Continue citalopram  Peripheral neuropathy -Continue gabapentin  Hypothyroidism -Continue levothyroxine       Disposition: Status is: Inpatient  The patient requires ongoing hospital level care due to her persistent severity of symptoms, severe dyspnea with minimal exertion, considerably worse than baseline.  She is overall deteriorating, we will consult palliative care, and work towards possible SNF with palliative care follow-up    Dispo: The patient is from: Home              Anticipated d/c is to: Possibly SNF              Anticipated d/c date is: Unclear, possibly few more days                                    MDM: The below labs and imaging reports reviewed and summarized above.  Medication management as above.  Severe exacerbation of her chronic disease.  DVT prophylaxis: Lovenox Code Status: DNR Family Communication: Daughter by phone last night    Consultants:   Pulmonology  Procedures:     Antimicrobials:   Azithromycin 5/12 >>  Ceftriaxone  5/15>>  Culture data:              Subjective: Afebrile, listless, weak, anorexic, no vomiting, diarrhea, fever.       Objective: Vitals:   12/21/19 2348 12/22/19 0410 12/22/19 0423 12/22/19 0747  BP: (!) 156/77 (!) 155/81  (!) 158/70  Pulse: 92 95  97  Resp: 16 16  18   Temp: 98.1 F (36.7 C) 97.7 F (36.5 C)  97.6 F (36.4 C)  TempSrc:      SpO2: 96%  93%  97%  Weight:   70.9 kg   Height:        Intake/Output Summary (Last 24 hours) at 12/22/2019 1457 Last data filed at 12/22/2019 0459 Gross per 24 hour  Intake --  Output 1200 ml  Net -1200 ml   Filed Weights   12/20/19 0500 12/21/19 0500 12/22/19 0423  Weight: 71.3 kg 73.7 kg 70.9 kg    Examination: General appearance: Frail elderly female, sitting in bed, appears debilitated and weak.  No obvious distress.     HEENT: Anicteric, conjunctival seem normal, lids and lashes normal.  No nasal deformity, discharge, or epistaxis.  Nasal cannula in place. Skin: No suspicious rashes or lesions. Cardiac: Heart rate slightly elevated, regular, no murmurs, JVP normal, no lower extremity edema Respiratory: Tachypneic at rest, lung sounds diminished bilaterally, scant wheezing appreciated, very diminished.  Diminished. Abdomen: No tenderness palpation or guarding, no hepatosplenomegaly. MSK: Diminished muscle mass diffusely, diminished subcutaneous fat. Neuro: Awake and alert, attempts to answer questions, generally weak, oriented to self only.  Speech fluent. Psych: Affect blunted, judgment insight appear to be impaired by dementia t.       Data Reviewed: I have personally reviewed following labs and imaging studies:  CBC: Recent Labs  Lab 12/17/19 0922 12/19/19 0411 12/20/19 0729 12/21/19 0633 12/22/19 0631  WBC 14.7* 18.1* 25.6* 17.8* 15.8*  HGB 11.9* 12.1 12.4 12.3 13.1  HCT 36.6 37.7 38.6 38.4 41.5  MCV 92.4 92.6 92.8 92.8 91.8  PLT 367 484* 392 370 [id number]*   Basic Metabolic Panel: Recent Labs  Lab 12/17/19 0626 12/19/19 0411 12/20/19 0729 12/21/19 0633 12/22/19 0631  NA 141 145 145 147* 146*  K 3.8 2.9* 3.3* 3.7 3.3*  CL 100 102 106 106 102  CO2 29 29 31 30  32  GLUCOSE 88 96 105* 94 103*  BUN 11 22 20  24* 25*  CREATININE 0.53 0.58 0.41* 0.38* 0.52  CALCIUM 9.4 9.1 8.9 9.0 9.3  MG  --  1.8  --   --   --    GFR: Estimated Creatinine Clearance: 49.6 mL/min (by  C-G formula based on SCr of 0.52 mg/dL). Liver Function Tests: No results for input(s): AST, ALT, ALKPHOS, BILITOT, PROT, ALBUMIN in the last 168 hours. No results for input(s): LIPASE, AMYLASE in the last 168 hours. No results for input(s): AMMONIA in the last 168 hours. Coagulation Profile: Recent Labs  Lab 12/17/19 0737  INR 1.1   Cardiac Enzymes: No results for input(s): CKTOTAL, CKMB, CKMBINDEX, TROPONINI in the last 168 hours. BNP (last 3 results) No results for input(s): PROBNP in the last 8760 hours. HbA1C: No results for input(s): HGBA1C in the last 72 hours. CBG: Recent Labs  Lab 12/18/19 0457 12/20/19 0508 12/21/19 0530 12/21/19 0723 12/22/19 0419  GLUCAP 140* 104* 88 87 106*   Lipid Profile: No results for input(s): CHOL, HDL, LDLCALC, TRIG, CHOLHDL, LDLDIRECT in the last 72 hours. Thyroid Function Tests: No results for input(s): TSH,  T4TOTAL, FREET4, T3FREE, THYROIDAB in the last 72 hours. Anemia Panel: No results for input(s): VITAMINB12, FOLATE, FERRITIN, TIBC, IRON, RETICCTPCT in the last 72 hours. Urine analysis:    Component Value Date/Time   COLORURINE YELLOW (A) 12/16/2019 1504   APPEARANCEUR HAZY (A) 12/16/2019 1504   APPEARANCEUR Hazy 01/26/2014 1040   LABSPEC 1.030 12/16/2019 1504   LABSPEC 1.017 01/26/2014 1040   PHURINE 6.0 12/16/2019 1504   GLUCOSEU NEGATIVE 12/16/2019 1504   GLUCOSEU Negative 01/26/2014 1040   HGBUR NEGATIVE 12/16/2019 1504   BILIRUBINUR NEGATIVE 12/16/2019 1504   BILIRUBINUR Negative 01/26/2014 1040   KETONESUR 20 (A) 12/16/2019 1504   PROTEINUR NEGATIVE 12/16/2019 1504   NITRITE NEGATIVE 12/16/2019 1504   LEUKOCYTESUR SMALL (A) 12/16/2019 1504   LEUKOCYTESUR Negative 01/26/2014 1040   Sepsis Labs: @LABRCNTIP (procalcitonin:4,lacticacidven:4)  ) Recent Results (from the past 240 hour(s))  SARS Coronavirus 2 by RT PCR (hospital order, performed in [facility] hospital lab) Nasopharyngeal Nasopharyngeal Swab      Status: None   Collection Time: 12/16/19  9:14 PM   Specimen: Nasopharyngeal Swab  Result Value Ref Range Status   SARS Coronavirus 2 NEGATIVE NEGATIVE Final    Comment: (NOTE) SARS-CoV-2 target nucleic acids are NOT DETECTED. The SARS-CoV-2 RNA is generally detectable in upper and lower respiratory specimens during the acute phase of infection. The lowest concentration of SARS-CoV-2 viral copies this assay can detect is 250 copies / mL. A negative result does not preclude SARS-CoV-2 infection and should not be used as the sole basis for treatment or other patient management decisions.  A negative result may occur with improper specimen collection / handling, submission of specimen other than nasopharyngeal swab, presence of viral mutation(s) within the areas targeted by this assay, and inadequate number of viral copies (<250 copies / mL). A negative result must be combined with clinical observations, patient history, and epidemiological information. Fact Sheet for Patients:   StrictlyIdeas.no Fact Sheet for Healthcare Providers: BankingDealers.co.za This test is not yet approved or cleared  by the [place] FDA and has been authorized for detection and/or diagnosis of SARS-CoV-2 by FDA under an Emergency Use Authorization (EUA).  This EUA will remain in effect (meaning this test can be used) for the duration of the COVID-19 declaration under Section 564(b)(1) of the Act, 21 U.S.C. section 360bbb-3(b)(1), unless the authorization is terminated or revoked sooner. Performed at [facility], [address], [place], [place] [address]          Radiology Studies: [facility] Chest [address] 1 View  Result Date: 12/21/2019 CLINICAL DATA:  History of COPD. Progressive dyspnea on exertion. EXAM: PORTABLE CHEST 1 VIEW COMPARISON:  Chest CT 12/19/2019 FINDINGS: Normal heart size. Tortuous calcified thoracic aorta. Chronic bronchitic  changes. No pleural effusion or edema. Remote right posterior rib fractures. Both lungs are clear. The visualized skeletal structures are unremarkable. IMPRESSION: No acute cardiopulmonary abnormalities. Aortic Atherosclerosis (ICD10-I70.0). Electronically Signed   By: Kerby Moors M.D.   On: 12/21/2019 08:35        Scheduled Meds: . amLODipine  5 mg Oral Daily  . aspirin  81 mg Oral Daily  . citalopram  20 mg Oral Daily  . clopidogrel  75 mg Oral Daily  . enoxaparin (LOVENOX) injection  40 mg Subcutaneous Q24H  . furosemide  40 mg Intravenous Daily  . gabapentin  100 mg Oral BID  . levothyroxine  37.5 mcg Oral Q0600  . metoprolol succinate  25 mg Oral Daily  . multivitamin-lutein  1 capsule Oral BID  . potassium chloride  40 mEq Oral Once  . pravastatin  40 mg Oral QHS  . predniSONE  20 mg Oral Q breakfast  . sodium chloride flush  3 mL Intravenous Once  . sodium chloride flush  3 mL Intravenous Q12H   Continuous Infusions: . cefTRIAXone (ROCEPHIN)  IV 1 g (12/21/19 1816)  . dextrose 5 % with KCl 20 mEq / L 20 mEq (12/22/19 0908)     LOS: 4 days    Time spent: 25 minutes    Edwin Dada, MD [facility] 12/22/2019, 2:57 PM     Please page though [place] or Epic secure chat:  For password, contact charge nurse

## 2019-12-22 NOTE — Care Management Important Message (Signed)
Important Message  Patient Details  Name: Toni White MRN: [id number] Date of Birth: 12/29/1935   Medicare Important Message Given:  Yes     Dannette Barbara 12/22/2019, 1:54 PM

## 2019-12-22 NOTE — Plan of Care (Signed)
  Problem: Education: Goal: Knowledge of General Education information will improve Description: Including pain rating scale, medication(s)/side effects and non-pharmacologic comfort measures 12/22/2019 1551 by Lorn Junes, RN Outcome: Not Progressing 12/22/2019 1551 by Lorn Junes, RN Outcome: Progressing   Problem: Health Behavior/Discharge Planning: Goal: Ability to manage health-related needs will improve 12/22/2019 1551 by Lorn Junes, RN Outcome: Not Progressing 12/22/2019 1551 by Lorn Junes, RN Outcome: Progressing   Problem: Clinical Measurements: Goal: Ability to maintain clinical measurements within normal limits will improve 12/22/2019 1551 by Lorn Junes, RN Outcome: Not Progressing 12/22/2019 1551 by Lorn Junes, RN Outcome: Progressing Goal: Will remain free from infection 12/22/2019 1551 by Lorn Junes, RN Outcome: Not Progressing 12/22/2019 1551 by Lorn Junes, RN Outcome: Progressing Goal: Diagnostic test results will improve 12/22/2019 1551 by Lorn Junes, RN Outcome: Not Progressing 12/22/2019 1551 by Lorn Junes, RN Outcome: Progressing Goal: Respiratory complications will improve 12/22/2019 1551 by Lorn Junes, RN Outcome: Not Progressing 12/22/2019 1551 by Lorn Junes, RN Outcome: Progressing Goal: Cardiovascular complication will be avoided 12/22/2019 1551 by Lorn Junes, RN Outcome: Not Progressing 12/22/2019 1551 by Lorn Junes, RN Outcome: Progressing   Problem: Activity: Goal: Risk for activity intolerance will decrease 12/22/2019 1551 by Lorn Junes, RN Outcome: Not Progressing 12/22/2019 1551 by Lorn Junes, RN Outcome: Progressing   Problem: Nutrition: Goal: Adequate nutrition will be maintained 12/22/2019 1551 by Lorn Junes, RN Outcome: Not Progressing 12/22/2019 1551 by Lorn Junes, RN Outcome:  Progressing   Problem: Coping: Goal: Level of anxiety will decrease 12/22/2019 1551 by Lorn Junes, RN Outcome: Not Progressing 12/22/2019 1551 by Lorn Junes, RN Outcome: Progressing   Problem: Elimination: Goal: Will not experience complications related to bowel motility 12/22/2019 1551 by Lorn Junes, RN Outcome: Not Progressing 12/22/2019 1551 by Lorn Junes, RN Outcome: Progressing Goal: Will not experience complications related to urinary retention 12/22/2019 1551 by Lorn Junes, RN Outcome: Not Progressing 12/22/2019 1551 by Lorn Junes, RN Outcome: Progressing   Problem: Pain Managment: Goal: General experience of comfort will improve 12/22/2019 1551 by Lorn Junes, RN Outcome: Not Progressing 12/22/2019 1551 by Lorn Junes, RN Outcome: Progressing   Problem: Safety: Goal: Ability to remain free from injury will improve 12/22/2019 1551 by Lorn Junes, RN Outcome: Not Progressing 12/22/2019 1551 by Lorn Junes, RN Outcome: Progressing   Problem: Skin Integrity: Goal: Risk for impaired skin integrity will decrease 12/22/2019 1551 by Lorn Junes, RN Outcome: Not Progressing 12/22/2019 1551 by Lorn Junes, RN Outcome: Progressing

## 2019-12-22 NOTE — Progress Notes (Signed)
Patient weaned to 2L O2, Sats remaining above 88%, will continue with plan of care.

## 2019-12-22 NOTE — TOC Progression Note (Addendum)
Transition of Care [facility]) - Progression Note    Patient Details  Name: KHAMYAH STOHR MRN: [id number] Date of Birth: 1936-02-01  Transition of Care [facility]) CM/SW Contact  Adelie Croswell, Gardiner Rhyme, LCSW Phone Number: 12/22/2019, 1:10 PM  Clinical Narrative:   Left message for daughter regarding bed offers. Attempted to speak to pt but she deferred to daughter.  1:42 PM have spoken with daughter to let her know the two bed offers she has. Daughter to look them up and plans on taking with MD this afternoon. Will need decision so can work on [employer].  Expected Discharge Plan: [place] Barriers to Discharge: Continued Medical Work up  Expected Discharge Plan and Services Expected Discharge Plan: [place] Choice: [place] arrangements for the past 2 months: Single Family Home                                       Social Determinants of Health (SDOH) Interventions    Readmission Risk Interventions No flowsheet data found.

## 2019-12-22 NOTE — Progress Notes (Signed)
Pulmonary Medicine          Date: 12/22/2019,   MRN# [id number] Toni White [birth year]     AdmissionWeight: 77.1 kg                 CurrentWeight: 70.9 kg  Referring physician: Dr Loleta Books    CHIEF COMPLAINT:   Worsening shortness of breath at rest.    SUBJECTIVE   Patient is resting in bed this AM.    Spoke with daughter today regarding overall weak and chronically ill state of patient with at dementia possible vascular and worsening post recent CVA.  Overall patient is at high risk for aspiration and continued clinical decline. She will need assistance for all ADLs, and would benefit from palliative care evaluation.  Hospital course and care plan reviewed with daughter as well as attending physician Dr Loleta Books.  We will continue to work with family to support patient with transitional care services and case management for additional resources post hospital discharge.    PAST MEDICAL HISTORY   Past Medical History:  Diagnosis Date  . COPD (chronic obstructive pulmonary disease) ([place])   . DNR (do not resuscitate) 11/2019   DNR/DNI, confirmed in person by patient and her daughter/POA  . History of chicken pox   . Hyperlipidemia   . Hypertension   . Ulcer      SURGICAL HISTORY   Past Surgical History:  Procedure Laterality Date  . ABDOMINAL HYSTERECTOMY  2003   with BSO due to bleeding  . APPENDECTOMY    . BREAST BIOPSY Left 2005   stereo breast biopsy benign  . BREAST BIOPSY Right 2005   core bx. benign  . BREAST BIOPSY Left 2000?   benign  . BREAST CYST EXCISION Left 1990   incision and draniage of abscess  . CAROTID PTA/STENT INTERVENTION Left 12/04/2019   Procedure: CAROTID PTA/STENT INTERVENTION;  Surgeon: Katha Cabal, MD;  Location: [facility];  Service: Cardiovascular;  Laterality: Left;  . CHOLECYSTECTOMY    . COLONOSCOPY  2003   Dr. Vira Agar. Hyperplastic polyps; Single polyp excision from rectum  . CT Scan of Brain   08/20/2013   Mild diffuse cortical atrophy. Mid chronic ischemic white matter disease. 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 [place]  . INCISION AND DRAINAGE BREAST ABSCESS Left 1990  . TONSILLECTOMY  1952  . UPPER GI ENDOSCOPY  02/26/2002   Dr. Tiffany Kocher; Gastritis. Single gastric ectasia     FAMILY HISTORY   Family History  Problem Relation Age of Onset  . Heart disease Father   . Congestive Heart Failure Father   . Heart disease Mother   . Congestive Heart Failure Mother   . Cancer Brother        lung  . Lung cancer Brother   . Diabetes Daughter   . Diabetes Daughter   . Breast cancer Neg Hx      SOCIAL HISTORY   Social History   Tobacco Use  . Smoking status: Former Smoker    Packs/day: 1.00    Years: 30.00    Pack years: 30.00    Types: Cigarettes  . Smokeless tobacco: Never Used  . Tobacco comment: quit around 1989  Substance Use Topics  . Alcohol use: No  . Drug use: No     MEDICATIONS    Home Medication:    Current Medication:  Current Facility-Administered Medications:  .  acetaminophen (TYLENOL)  tablet 650 mg, 650 mg, Oral, Q6H PRN **OR** acetaminophen (TYLENOL) suppository 650 mg, 650 mg, Rectal, Q6H PRN, Mansy, Jan A, MD .  alum & mag hydroxide-simeth (MAALOX/MYLANTA) 200-200-20 MG/5ML suspension 30 mL, 30 mL, Oral, Q6H PRN, Mansy, Jan A, MD .  amLODipine (NORVASC) tablet 5 mg, 5 mg, Oral, Daily, Mansy, Jan A, MD, 5 mg at 12/22/19 0826 .  aspirin chewable tablet 81 mg, 81 mg, Oral, Daily, Mansy, Jan A, MD, 81 mg at 12/22/19 0827 .  cefTRIAXone (ROCEPHIN) 1 g in sodium chloride 0.9 % 100 mL IVPB, 1 g, Intravenous, Q24H, Danford, Suann Larry, MD, Last Rate: 200 mL/hr at 12/21/19 1816, 1 g at 12/21/19 1816 .  citalopram (CELEXA) tablet 20 mg, 20 mg, Oral, Daily, Mansy, Jan A, MD, 20 mg at 12/22/19 [id number] .  clopidogrel (PLAVIX) tablet 75 mg, 75 mg, Oral, Daily, Mansy, Jan A, MD, 75 mg at 12/22/19 0827 .   dextrose 5 % with KCl 20 mEq / L  infusion, 20 mEq, Intravenous, Continuous, Danford, Suann Larry, MD, Last Rate: 50 mL/hr at 12/22/19 0908, 20 mEq at 12/22/19 0908 .  enoxaparin (LOVENOX) injection 40 mg, 40 mg, Subcutaneous, Q24H, Mansy, Jan A, MD, 40 mg at 12/22/19 0827 .  furosemide (LASIX) injection 40 mg, 40 mg, Intravenous, Daily, Ottie Glazier, MD, 40 mg at 12/22/19 0826 .  gabapentin (NEURONTIN) capsule 100 mg, 100 mg, Oral, BID, Mansy, Jan A, MD, 100 mg at 12/22/19 [id number] .  levalbuterol (XOPENEX) nebulizer solution 0.63 mg, 0.63 mg, Nebulization, Q6H PRN, Danford, Suann Larry, MD .  levothyroxine (SYNTHROID) tablet 37.5 mcg, 37.5 mcg, Oral, Q0600, Mansy, Jan A, MD, 37.5 mcg at 12/22/19 0612 .  magnesium hydroxide (MILK OF MAGNESIA) suspension 30 mL, 30 mL, Oral, Daily PRN, Mansy, Jan A, MD .  metoprolol succinate (TOPROL-XL) 24 hr tablet 25 mg, 25 mg, Oral, Daily, Danford, Suann Larry, MD, 25 mg at 12/22/19 0827 .  multivitamin-lutein (OCUVITE-LUTEIN) capsule 1 capsule, 1 capsule, Oral, BID, Mansy, Jan A, MD, 1 capsule at 12/22/19 0830 .  ondansetron (ZOFRAN) tablet 4 mg, 4 mg, Oral, Q6H PRN **OR** ondansetron (ZOFRAN) injection 4 mg, 4 mg, Intravenous, Q6H PRN, Mansy, Jan A, MD .  potassium chloride SA (KLOR-CON) CR tablet 40 mEq, 40 mEq, Oral, Once, Danford, Suann Larry, MD .  pravastatin (PRAVACHOL) tablet 40 mg, 40 mg, Oral, QHS, Mansy, Jan A, MD, 40 mg at 12/21/19 2045 .  predniSONE (DELTASONE) tablet 20 mg, 20 mg, Oral, Q breakfast, Thomes Burak, MD, 20 mg at 12/22/19 0828 .  sodium chloride flush (NS) 0.9 % injection 3 mL, 3 mL, Intravenous, Once, Duffy Bruce, MD .  sodium chloride flush (NS) 0.9 % injection 3 mL, 3 mL, Intravenous, Q12H, Mansy, Jan A, MD, 3 mL at 12/22/19 [id number]    ALLERGIES   Flexeril  [cyclobenzaprine hcl], Reglan [metoclopramide], Relafen [nabumetone], Risperdal  [risperidone], Sulfa antibiotics, Tape, Triamterene-hctz, and Hctz  [hydrochlorothiazide]     REVIEW OF SYSTEMS    Review of Systems:  Gen:  Denies  fever, sweats, chills weigh loss  HEENT: Denies blurred vision, double vision, ear pain, eye pain, hearing loss, nose bleeds, sore throat Cardiac:  No dizziness, chest pain or heaviness, chest tightness,edema Resp:   Denies cough or sputum porduction, shortness of breath,wheezing, hemoptysis,  Gi: Denies swallowing difficulty, stomach pain, nausea or vomiting, diarrhea, constipation, bowel incontinence Gu:  Denies bladder incontinence, burning urine Ext:   Denies Joint pain, stiffness or swelling Skin: Denies  skin rash, easy  bruising or bleeding or hives Endoc:  Denies polyuria, polydipsia , polyphagia or weight change Psych:   Denies depression, insomnia or hallucinations   Other:  All other systems negative   VS: BP (!) 158/70 (BP Location: Left Arm)   Pulse 97   Temp 97.6 F (36.4 C)   Resp 18   Ht 5\' 1"  (1.549 m)   Wt 70.9 kg   SpO2 97%   BMI 29.53 kg/m      PHYSICAL EXAM    GENERAL:NAD, no fevers, chills, no weakness no fatigue HEAD: Normocephalic, atraumatic.  EYES: Pupils equal, round, reactive to light. Extraocular muscles intact. No scleral icterus.  MOUTH: Moist mucosal membrane. Dentition intact. No abscess noted.  EAR, NOSE, THROAT: Clear without exudates. No external lesions.  NECK: Supple. No thyromegaly. No nodules. No JVD.  PULMONARY: Mildly ronchorous breath sounds.  CARDIOVASCULAR: S1 and S2. Regular rate and rhythm. No murmurs, rubs, or gallops. No edema. Pedal pulses 2+ bilaterally.  GASTROINTESTINAL: Soft, nontender, nondistended. No masses. Positive bowel sounds. No hepatosplenomegaly.  MUSCULOSKELETAL: No swelling, clubbing, or edema. Range of motion full in all extremities.  NEUROLOGIC: Cranial nerves II through XII are intact. No gross focal neurological deficits. Sensation intact. Reflexes intact.  SKIN: No ulceration, lesions, rashes, or cyanosis. Skin warm  and dry. Turgor intact.  PSYCHIATRIC: Mood, affect within normal limits. The patient is awake, alert and oriented x 3. Insight, judgment intact.       IMAGING    CT ANGIO HEAD W OR WO CONTRAST  Result Date: 12/02/2019 CLINICAL DATA:  Carotid artery stenosis. Recent slurred speech and mental status changes. Basilar tip aneurysm. Left carotid stenosis by ultrasound. EXAM: CT ANGIOGRAPHY HEAD AND NECK TECHNIQUE: Multidetector CT imaging of the head and neck was performed using the standard protocol during bolus administration of intravenous contrast. Multiplanar CT image reconstructions and MIPs were obtained to evaluate the vascular anatomy. Carotid stenosis measurements (when applicable) are obtained utilizing NASCET criteria, using the distal internal carotid diameter as the denominator. CONTRAST:  46mL OMNIPAQUE IOHEXOL 350 MG/ML SOLN COMPARISON:  MRI same day. Ultrasound same day. MRI 08/26/2013. CT angiography 08/19/2013. FINDINGS: CT HEAD FINDINGS Brain: Brain atrophy with chronic small-vessel ischemic changes affecting the pons, thalami, basal ganglia and hemispheric white matter. No sign of acute infarction, mass lesion, hemorrhage, hydrocephalus or extra-axial collection. Vascular: There is atherosclerotic calcification of the major vessels at the base of the brain. 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 See below. Skull: Negative Sinuses: Clear sinuses. Orbits: Lens calcification on the right. Review of the MIP images confirms the above findings CTA NECK FINDINGS Aortic arch: Aortic atherosclerotic calcification. No aneurysm or dissection. Branching pattern is normal without flow limiting stenosis. Right carotid system: Common carotid artery widely patent to the bifurcation and ICA bulb. Somewhat irregular soft and calcified plaque affecting the ICA bulb with minimal diameter of 3.4 mm. Compared to a more distal cervical ICA diameter of 5 mm, this indicates a 30% stenosis. Vessels are tortuous, swinging  to the midline behind the oropharynx. Left carotid system: Common carotid artery is patent to the bifurcation. Severe irregular calcified plaque at the carotid bifurcation and ICA bulb. Luminal stenosis likely 1 mm. Compared to a more distal cervical ICA diameter of 5 mm, this indicates an 80% stenosis. Vessels are tortuous, swinging to the midline behind the oropharynx. Vertebral arteries: Calcified plaque at both vertebral artery origin regions but without stenosis greater than 30%. Scattered areas of calcified plaque through the cervical course of both  vertebral arteries but no stenosis greater than 30%. Skeleton: Degenerative cervical spondylosis. Other neck: No mass or lymphadenopathy. Upper chest: Emphysema and pulmonary scarring at the apices. Review of the MIP images confirms the above findings CTA HEAD FINDINGS Anterior circulation: Both internal carotid arteries are patent through the skull base and siphon regions. Ordinary siphon atherosclerotic calcification but without stenosis greater than 30%. Anterior and middle cerebral vessels are patent without large or medium vessel occlusion, aneurysm or vascular malformation. Posterior circulation: Both vertebral arteries are patent through the foramen magnum. There is atherosclerotic disease at both vertebral artery V4 segments with stenosis estimated at 50% on both sides. Both vessels supply the basilar. No basilar stenosis. 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 Venous sinuses: Patent and normal. Anatomic variants: None significant. Review of the MIP images confirms the above findings IMPRESSION: No acute large or medium vessel occlusion. Aortic Atherosclerosis (ICD10-I70.0) and Emphysema (ICD10-J43.9). Atherosclerotic disease at both carotid bifurcation and ICA bulb regions. 30% ICA stenosis on the right. 80% or greater irregular stenosis of the ICA bulb on the left. Vessels are tortuous, swinging to the  midline behind the oropharynx. Atherosclerotic disease of both vertebral artery V4 segments with stenoses estimated at 50%. 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 Electronically Signed   By: Nelson Chimes M.D.   On: 12/02/2019 15:56   DG Chest 2 View  Result Date: 12/19/2019 CLINICAL DATA:  Cough, COPD, hypertension. EXAM: CHEST - 2 VIEW COMPARISON:  Chest x-rays dated 12/05/2019 and 12/02/2019. FINDINGS: Stable cardiomegaly. Lungs are clear. No pleural effusion or pneumothorax is seen. Probable hiatal hernia. No acute appearing osseous abnormality. IMPRESSION: 1. No active cardiopulmonary disease. No evidence of pneumonia or pulmonary edema. 2. Stable cardiomegaly. 3. Probable hiatal hernia. Electronically Signed   By: Franki Cabot M.D.   On: 12/19/2019 13:13   CT Head Wo Contrast  Result Date: 12/16/2019 CLINICAL DATA:  Near syncopal episode today. EXAM: CT HEAD WITHOUT CONTRAST TECHNIQUE: Contiguous axial images were obtained from the base of the skull through the vertex without intravenous contrast. COMPARISON:  Brain MRI 12/02/2019.  Head CT scan 12/01/2019. FINDINGS: Brain: No evidence of acute infarction, hemorrhage, hydrocephalus, extra-axial collection or mass lesion/mass effect. Extensive chronic microvascular ischemic change again seen. Lacunar infarctions in the basal ganglia and thalami also again seen. Vascular: No hyperdense vessel or unexpected calcification. Skull: Intact.  No focal lesion. Sinuses/Orbits: Negative. Other: None. IMPRESSION: No acute abnormality. Extensive chronic microvascular ischemic disease. Electronically Signed   By: Inge Rise M.D.   On: 12/16/2019 21:40   CT HEAD WO CONTRAST  Result Date: 12/01/2019 CLINICAL DATA:  Slurred speech x2 days. EXAM: CT HEAD WITHOUT CONTRAST TECHNIQUE: Contiguous axial images were obtained from the base of the skull through the vertex without intravenous  contrast. COMPARISON:  Dec 19, 2017 FINDINGS: Brain: There is mild cerebral atrophy with widening of the extra-axial spaces and ventricular dilatation. There are areas of decreased attenuation within the white matter tracts of the supratentorial brain, consistent with microvascular disease changes. Small chronic bilateral basal ganglia lacunar infarcts are seen. 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 Skull: Normal. Negative for fracture or focal lesion. Sinuses/Orbits: No acute finding. Other: None. IMPRESSION: 1. Generalized cerebral atrophy. 2. No acute intracranial abnormality. 3. Stable, approximately 5.3 mm  diameter partially calcified, aneurysmal dilatation of the tip of the basilar artery. 4. Small chronic bilateral basal ganglia lacunar infarcts. Electronically Signed   By: Virgina Norfolk M.D.   On: 12/01/2019 20:16   CT ANGIO NECK W OR WO CONTRAST  Result Date: 12/02/2019 CLINICAL DATA:  Carotid artery stenosis. Recent slurred speech and mental status changes. Basilar tip aneurysm. Left carotid stenosis by ultrasound. EXAM: CT ANGIOGRAPHY HEAD AND NECK TECHNIQUE: Multidetector CT imaging of the head and neck was performed using the standard protocol during bolus administration of intravenous contrast. Multiplanar CT image reconstructions and MIPs were obtained to evaluate the vascular anatomy. Carotid stenosis measurements (when applicable) are obtained utilizing NASCET criteria, using the distal internal carotid diameter as the denominator. CONTRAST:  40mL OMNIPAQUE IOHEXOL 350 MG/ML SOLN COMPARISON:  MRI same day. Ultrasound same day. MRI 08/26/2013. CT angiography 08/19/2013. FINDINGS: CT HEAD FINDINGS Brain: Brain atrophy with chronic small-vessel ischemic changes affecting the pons, thalami, basal ganglia and hemispheric white matter. No sign of acute infarction, mass lesion, hemorrhage, hydrocephalus  or extra-axial collection. Vascular: There is atherosclerotic calcification of the major vessels at the base of the brain. 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 See below. Skull: Negative Sinuses: Clear sinuses. Orbits: Lens calcification on the right. Review of the MIP images confirms the above findings CTA NECK FINDINGS Aortic arch: Aortic atherosclerotic calcification. No aneurysm or dissection. Branching pattern is normal without flow limiting stenosis. Right carotid system: Common carotid artery widely patent to the bifurcation and ICA bulb. Somewhat irregular soft and calcified plaque affecting the ICA bulb with minimal diameter of 3.4 mm. Compared to a more distal cervical ICA diameter of 5 mm, this indicates a 30% stenosis. Vessels are tortuous, swinging to the midline behind the oropharynx. Left carotid system: Common carotid artery is patent to the bifurcation. Severe irregular calcified plaque at the carotid bifurcation and ICA bulb. Luminal stenosis likely 1 mm. Compared to a more distal cervical ICA diameter of 5 mm, this indicates an 80% stenosis. Vessels are tortuous, swinging to the midline behind the oropharynx. Vertebral arteries: Calcified plaque at both vertebral artery origin regions but without stenosis greater than 30%. Scattered areas of calcified plaque through the cervical course of both vertebral arteries but no stenosis greater than 30%. Skeleton: Degenerative cervical spondylosis. Other neck: No mass or lymphadenopathy. Upper chest: Emphysema and pulmonary scarring at the apices. Review of the MIP images confirms the above findings CTA HEAD FINDINGS Anterior circulation: Both internal carotid arteries are patent through the skull base and siphon regions. Ordinary siphon atherosclerotic calcification but without stenosis greater than 30%. Anterior and middle cerebral vessels are patent without large or medium vessel occlusion, aneurysm or vascular malformation. Posterior circulation: Both  vertebral arteries are patent through the foramen magnum. There is atherosclerotic disease at both vertebral artery V4 segments with stenosis estimated at 50% on both sides. Both vessels supply the basilar. No basilar stenosis. 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 Venous sinuses: Patent and normal. Anatomic variants: None significant. Review of the MIP images confirms the above findings IMPRESSION: No acute large or medium vessel occlusion. Aortic Atherosclerosis (ICD10-I70.0) and Emphysema (ICD10-J43.9). Atherosclerotic disease at both carotid bifurcation and ICA bulb regions. 30% ICA stenosis on the right. 80% or greater irregular stenosis of the ICA bulb on the left. Vessels are tortuous, swinging to the midline behind the oropharynx. Atherosclerotic disease of both vertebral artery V4 segments with stenoses estimated at 50%.  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 Electronically Signed   By: Nelson Chimes M.D.   On: 12/02/2019 15:56   CT Angio Chest PE W and/or Wo Contrast  Result Date: 12/16/2019 CLINICAL DATA:  Shortness of breath, near syncope EXAM: CT ANGIOGRAPHY CHEST WITH CONTRAST TECHNIQUE: Multidetector CT imaging of the chest was performed using the standard protocol during bolus administration of intravenous contrast. Multiplanar CT image reconstructions and MIPs were obtained to evaluate the vascular anatomy. CONTRAST:  68mL OMNIPAQUE IOHEXOL 350 MG/ML SOLN COMPARISON:  06/30/2011, 12/05/2019 FINDINGS: Cardiovascular: This is a technically adequate evaluation of the pulmonary vasculature. No filling defects or pulmonary emboli. The heart is normal without pericardial effusion. There is extensive atherosclerosis of the thoracic aorta, with no aneurysm or dissection. Significant atherosclerosis throughout the coronary vasculature greatest in the LAD distribution.  Mediastinum/Nodes: No enlarged mediastinal, hilar, or axillary lymph nodes. Thyroid gland, trachea, and esophagus demonstrate no significant findings. Small hiatal hernia is noted. Lungs/Pleura: Upper lobe predominant emphysema is seen, with scattered areas of air trapping. No airspace disease, effusion, or pneumothorax. Central airways are patent. Upper Abdomen: No acute abnormality. Musculoskeletal: No acute or destructive bony lesions. Reconstructed images demonstrate no additional findings. Review of the MIP images confirms the above findings. IMPRESSION: 1. No CT evidence of pulmonary embolism. 2. Small hiatal hernia. 3. Aortic Atherosclerosis (ICD10-I70.0) and Emphysema (ICD10-J43.9). Electronically Signed   By: Randa Ngo M.D.   On: 12/16/2019 21:40   MR BRAIN WO CONTRAST  Result Date: 12/02/2019 CLINICAL DATA:  Neuro deficit, acute, stroke suspected. Additional history provided: Acute onset slurred speech, headache, mild nausea without vomiting EXAM: MRI HEAD WITHOUT CONTRAST TECHNIQUE: Multiplanar, multiecho pulse sequences of the brain and surrounding structures were obtained without intravenous contrast. COMPARISON:  Noncontrast head CT 12/01/2019, noncontrast head CT 12/19/2017, brain MRI 08/26/2013, CT angiogram head 08/19/2013 FINDINGS: Brain: Multiple sequences are significantly motion degraded. Most notably there is moderate motion degradation of the axial and coronal diffusion-weighted sequence, moderate motion degradation of the axial SWI sequence, moderate motion degradation of the axial T1 weighted sequence, moderate/severe motion degradation of the axial T2 FLAIR sequence and moderate/severe motion degradation of the coronal T2 weighted sequence. Moderate to advanced patchy and confluent T2/FLAIR hyperintensity within the cerebral white matter is nonspecific, but consistent with chronic small vessel ischemic disease. Findings have progressed as compared to prior MRI 08/26/2013.  Redemonstrated chronic lacunar infarcts within the bilateral basal ganglia. Also again seen, there are chronic small-vessel ischemic changes within the thalami and pons. Chronic microhemorrhages within the right frontal lobe, left periatrial region and left cerebellum not definitively present on a prior MRI. Stable, mild generalized parenchymal atrophy. There is no acute infarct. No evidence of intracranial mass. No extra-axial fluid collection. No midline shift. Vascular: Expected proximal arterial flow voids. 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 Skull and upper cervical spine: No focal marrow lesion Sinuses/Orbits: Visualized orbits show no acute finding. Mild ethmoid and right maxillary sinus mucosal thickening. No significant mastoid effusion. IMPRESSION: 1. Motion degraded examination as described. 2. No evidence of acute intracranial abnormality, including acute infarction. 3. Moderate to advanced chronic small vessel ischemic disease has progressed as compared to prior MRI 08/26/2013. Redemonstrated chronic lacunar infarcts within the bilateral basal ganglia. 4. Nonspecific chronic microhemorrhages within the right frontal lobe, left periatrial region and left cerebellum which  were not definitively present on the prior MRI. 5. Stable, mild generalized parenchymal atrophy. 6. 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 Electronically Signed   By: Kellie Simmering DO   On: 12/02/2019 08:26   US Carotid Bilateral (at [facility] and [facility] only)  Result Date: 12/02/2019 CLINICAL DATA:  84 year old female with TIA EXAM: BILATERAL CAROTID DUPLEX ULTRASOUND TECHNIQUE: Pearline Cables scale imaging, color Doppler and duplex ultrasound were performed of bilateral carotid and vertebral arteries in the neck. COMPARISON:  None. FINDINGS: Criteria: Quantification of carotid stenosis is  based on velocity parameters that correlate the residual internal carotid diameter with NASCET-based stenosis levels, using the diameter of the distal internal carotid lumen as the denominator for stenosis measurement. The following velocity measurements were obtained: RIGHT ICA:  Systolic 99991111 cm/sec, Diastolic 21 cm/sec CCA:  69 cm/sec SYSTOLIC ICA/CCA RATIO:  2.0 ECA:  120 cm/sec LEFT ICA:  Systolic 0000000 cm/sec, Diastolic 21 cm/sec CCA:  75 cm/sec SYSTOLIC ICA/CCA RATIO:  3.8 ECA:  1 4 cm/sec Right Brachial SBP: Not acquired Left Brachial SBP: Not acquired RIGHT CAROTID ARTERY: No significant calcifications of the right common carotid artery. Intermediate waveform maintained. Moderate heterogeneous and partially calcified plaque at the right carotid bifurcation. No significant lumen shadowing. Low resistance waveform of the right ICA. Tortuosity RIGHT VERTEBRAL ARTERY: Antegrade flow with low resistance waveform. LEFT CAROTID ARTERY: No significant calcifications of the left common carotid artery. Intermediate waveform maintained. Moderate heterogeneous and partially calcified plaque at the left carotid bifurcation. No significant lumen shadowing. Low resistance waveform of the left ICA. Tortuosity LEFT VERTEBRAL ARTERY:  Antegrade flow with low resistance waveform. IMPRESSION: Right: Color duplex indicates moderate heterogeneous and calcified plaque, with no hemodynamically significant stenosis by duplex criteria in the extracranial cerebrovascular circulation. Left: Heterogeneous and partially calcified plaque at the left carotid bifurcation, with discordant results regarding degree of stenosis by established duplex criteria. Peak velocity suggests 70% - 99% stenosis, with the ICA/ CCA ratio suggesting a lesser degree of stenosis. If establishing a more accurate degree of stenosis is required, cerebral angiogram should be considered, or as a second best test, CTA. Signed, Dulcy Fanny. Dellia Nims, RPVI [facility] Radiology Electronically Signed   By: Corrie Mckusick D.O.   On: 12/02/2019 11:33   PERIPHERAL VASCULAR CATHETERIZATION  Result Date: 12/04/2019 See op note  DG Chest Port 1 View  Result Date: 12/21/2019 CLINICAL DATA:  History of COPD. Progressive dyspnea on exertion. EXAM: PORTABLE CHEST 1 VIEW COMPARISON:  Chest CT 12/19/2019 FINDINGS: Normal heart size. Tortuous calcified thoracic aorta. Chronic bronchitic changes. No pleural effusion or edema. Remote right posterior rib fractures. Both lungs are clear. The visualized skeletal structures are unremarkable. IMPRESSION: No acute cardiopulmonary abnormalities. Aortic Atherosclerosis (ICD10-I70.0). Electronically Signed   By: Kerby Moors M.D.   On: 12/21/2019 08:35   DG Chest Port 1 View  Result Date: 12/05/2019 CLINICAL DATA:  Shortness of breath. EXAM: PORTABLE CHEST 1 VIEW COMPARISON:  12/02/2019 FINDINGS: Heart size remains enlarged. Calcification of the thoracic aorta is similar to the prior study. No consolidation or sign of pleural effusion. Slight increase in density along the paraspinal region likely reflects hiatal hernia. Spinal degenerative changes. No acute or destructive bone process. IMPRESSION: 1. No active cardiopulmonary disease. 2. Stable cardiomegaly and aortic atherosclerosis. Electronically Signed   By: Zetta Bills M.D.   On: 12/05/2019 10:42   DG Chest Portable  1 View  Result Date: 12/02/2019 CLINICAL DATA:  Slurred speech x2 days. EXAM: PORTABLE CHEST 1 VIEW COMPARISON:  January 26, 2014 FINDINGS: Mild diffuse chronic appearing increased lung markings are seen. There is no evidence of acute infiltrate, pleural effusion or pneumothorax. The cardiac silhouette is moderately enlarged. There is marked severity calcification of the aortic arch. Multilevel degenerative changes seen throughout the thoracic spine. IMPRESSION: Chronic-appearing increased lung markings without evidence of  acute or active cardiopulmonary disease. Electronically Signed   By: Virgina Norfolk M.D.   On: 12/02/2019 00:33   ECHOCARDIOGRAM COMPLETE  Result Date: 12/03/2019    ECHOCARDIOGRAM REPORT   Patient Name:   KIZZI MANNOR Date of Exam: 12/02/2019 Medical Rec #:  [id number]     Height:       61.0 in Accession #:    [id number]    Weight:       169.0 lb Date of Birth:  1936/01/09      BSA:          1.758 m Patient Age:    84 years      BP:           156/69 mmHg Patient Gender: F             HR:           70 bpm. Exam Location:  [facility] Procedure: 2D Echo, Cardiac Doppler and Color Doppler Indications:     TIA 435.9  History:         Patient has no prior history of Echocardiogram examinations.                  COPD; Risk Factors:Hypertension.  Sonographer:     Sherrie Sport RDCS (AE) Referring Phys:  [id number] Arvella Merles MANSY Diagnosing Phys: Yolonda Kida MD IMPRESSIONS  1. Left ventricular ejection fraction, by estimation, is 65 to 70%. The left ventricle has normal function. The left ventricle has no regional wall motion abnormalities. There is mild concentric left ventricular hypertrophy. Left ventricular diastolic parameters are consistent with Grade I diastolic dysfunction (impaired relaxation).  2. Right ventricular systolic function is moderately reduced. The right ventricular size is moderately enlarged. Mildly increased right ventricular wall thickness. There is normal pulmonary artery systolic pressure.  3. The mitral valve is normal in structure. No evidence of mitral valve regurgitation.  4. The aortic valve is grossly normal. Aortic valve regurgitation is not visualized. FINDINGS  Left Ventricle: Left ventricular ejection fraction, by estimation, is 65 to 70%. The left ventricle has normal function. The left ventricle has no regional wall motion abnormalities. The left ventricular internal cavity size was normal in size. There is  mild concentric left ventricular hypertrophy. Left ventricular diastolic  parameters are consistent with Grade I diastolic dysfunction (impaired relaxation). Right Ventricle: The right ventricular size is moderately enlarged. Mildly increased right ventricular wall thickness. Right ventricular systolic function is moderately reduced. There is normal pulmonary artery systolic pressure. The tricuspid regurgitant velocity is 1.81 m/s, and with an assumed right atrial pressure of 10 mmHg, the estimated right ventricular systolic pressure is [id number] mmHg. Left Atrium: Left atrial size was normal in size. Right Atrium: Right atrial size was normal in size. Pericardium: Trivial pericardial effusion is present. Mitral Valve: The mitral valve is normal in structure. No evidence of mitral valve regurgitation. Tricuspid Valve: The tricuspid valve is normal in structure. Tricuspid valve regurgitation is trivial. Aortic Valve: The aortic valve is grossly normal. Aortic valve regurgitation is not  visualized. Aortic valve mean gradient measures 3.0 mmHg. Aortic valve peak gradient measures 5.5 mmHg. Aortic valve area, by VTI measures 3.64 cm. Pulmonic Valve: The pulmonic valve was grossly normal. Pulmonic valve regurgitation is not visualized. Aorta: The aortic root is normal in size and structure. IAS/Shunts: No atrial level shunt detected by color flow Doppler.  LEFT VENTRICLE PLAX 2D LVIDd:         3.50 cm  Diastology LVIDs:         2.14 cm  LV e' lateral:   6.42 cm/s LV PW:         0.91 cm  LV E/e' lateral: 16.2 LV IVS:        1.21 cm  LV e' medial:    6.31 cm/s LVOT diam:     2.00 cm  LV E/e' medial:  16.5 LV SV:         82 LV SV Index:   47 LVOT Area:     3.14 cm  RIGHT VENTRICLE RV Basal diam:  2.26 cm LEFT ATRIUM             Index       RIGHT ATRIUM           Index LA diam:        3.40 cm 1.93 cm/m  RA Area:     11.70 cm LA Vol (A2C):   33.6 ml 19.11 ml/m RA Volume:   23.20 ml  13.19 ml/m LA Vol (A4C):   33.9 ml 19.28 ml/m LA Biplane Vol: 33.6 ml 19.11 ml/m  AORTIC VALVE                    PULMONIC VALVE AV Area (Vmax):    3.11 cm    PV Vmax:       0.70 m/s AV Area (Vmean):   3.72 cm    PV Peak grad:  2.0 mmHg AV Area (VTI):     3.64 cm AV Vmax:           117.00 cm/s AV Vmean:          79.400 cm/s AV VTI:            0.225 m AV Peak Grad:      5.5 mmHg AV Mean Grad:      3.0 mmHg LVOT Vmax:         116.00 cm/s LVOT Vmean:        93.900 cm/s LVOT VTI:          0.261 m LVOT/AV VTI ratio: 1.16  AORTA Ao Root diam: 3.10 cm MITRAL VALVE                TRICUSPID VALVE MV Area (PHT): 2.18 cm     TR Peak grad:   13.1 mmHg MV Decel Time: 348 msec     TR Vmax:        181.00 cm/s MV E velocity: 104.00 cm/s MV A velocity: 135.00 cm/s  SHUNTS MV E/A ratio:  0.77         Systemic VTI:  0.26 m                             Systemic Diam: 2.00 cm Dwayne Prince Rome MD Electronically signed by Yolonda Kida MD Signature Date/Time: 12/03/2019/6:39:32 AM    Final         ASSESSMENT/PLAN   Moderate Acute exacerbation of COPD  -  on xopenex  -changed prednisone 20 po daily - 12/21/19  - d/cd Daliresp 524mcg daily-12/17/19 - continue zithromax 500mg   po daily- noted Roephin added due to encephalopathy and leukocytosis -appreciate collaboration  -MetaNEB q4h- increased frequency to QID - 12/20/19  -  Mucomyst 58ml 20% BID-Finished 12/18/19  - IS at bedside -continue PT/OT -reviewed CXR from today 12/21/19- worsening interstitial opacification with cephalization compared to yesterday, consistent with mild pulmonary edema will initiate lasix.  BNP today. RT to wean O2 with goal spO2  88-92 on 2L/min supplemental O2      Acute on Chronic hypoxemic respiratory failure  - patient is on 4L/min [place] and O2 saturation is >95% Suspect atelectasis will incrase recruitment maneuvers.   - she is on home settings at this time  - recommend PT/OT and early mobilization    Thank you for allowing me to participate in the care of this patient.   Patient/Family are satisfied with care plan and all questions have been  answered.  This document was prepared using Dragon voice recognition software and may include unintentional dictation errors.     Ottie Glazier, M.D.  Division of [place]

## 2019-12-23 DIAGNOSIS — R531 Weakness: Secondary | ICD-10-CM

## 2019-12-23 DIAGNOSIS — J9610 Chronic respiratory failure, unspecified whether with hypoxia or hypercapnia: Secondary | ICD-10-CM

## 2019-12-23 DIAGNOSIS — G9341 Metabolic encephalopathy: Secondary | ICD-10-CM

## 2019-12-23 DIAGNOSIS — J441 Chronic obstructive pulmonary disease with (acute) exacerbation: Secondary | ICD-10-CM

## 2019-12-23 DIAGNOSIS — Z515 Encounter for palliative care: Secondary | ICD-10-CM

## 2019-12-23 DIAGNOSIS — Z7189 Other specified counseling: Secondary | ICD-10-CM

## 2019-12-23 DIAGNOSIS — R55 Syncope and collapse: Secondary | ICD-10-CM

## 2019-12-23 LAB — BASIC METABOLIC PANEL
Anion gap: 8 (ref 5–15)
BUN: 24 mg/dL — ABNORMAL HIGH (ref 8–23)
CO2: 31 mmol/L (ref 22–32)
Calcium: 9.2 mg/dL (ref 8.9–10.3)
Chloride: 103 mmol/L (ref 98–111)
Creatinine, Ser: 0.5 mg/dL (ref 0.44–1.00)
GFR calc Af Amer: >60 mL/min (ref >60)
GFR calc non Af Amer: >60 mL/min (ref >60)
Glucose, Bld: 94 mg/dL (ref 70–99)
Potassium: 4.1 mmol/L (ref 3.5–5.1)
Sodium: 142 mmol/L (ref 135–145)

## 2019-12-23 LAB — CBC
HCT: 40 % (ref 36.0–46.0)
Hemoglobin: 12.7 g/dL (ref 12.0–15.0)
MCH: 29.3 pg (ref 26.0–34.0)
MCHC: 31.8 g/dL (ref 30.0–36.0)
MCV: 92.2 fL (ref 80.0–100.0)
Platelets: 395 10*3/uL (ref 150–400)
RBC: 4.34 MIL/uL (ref 3.87–5.11)
RDW: 13.1 % (ref 11.5–15.5)
WBC: 11.1 10*3/uL — ABNORMAL HIGH (ref 4.0–10.5)
nRBC: 0 % (ref 0.0–0.2)

## 2019-12-23 LAB — GLUCOSE, CAPILLARY: Glucose-Capillary: 87 mg/dL (ref 70–99)

## 2019-12-23 MED ORDER — MORPHINE SULFATE (CONCENTRATE) 10 MG/0.5ML PO SOLN: 5.0000 mg | ORAL | Status: DC | PRN

## 2019-12-23 MED ORDER — PREDNISONE 10 MG PO TABS
10.0000 mg | ORAL_TABLET | Freq: Every day | ORAL | Status: DC
  Administered 2019-12-24: 10 mg via ORAL
  Filled 2019-12-23: qty 1

## 2019-12-23 NOTE — Consult Note (Signed)
Consultation Note Date: 12/23/2019   Patient Name: Toni White  DOB: 06-Jan-1936  MRN: [id number]  Age / Sex: 84 y.o., female  PCP: Birdie Sons, MD Referring Physician: Edwin Dada, *  Reason for Consultation: Establishing goals of care  HPI/Patient Profile: 84 y.o. female  with past medical history of COPD on home oxygen 2L, recent stroke, HTN admitted on 12/16/2019 with progressive dyspnea on exertion and pre-syncope. Hospital admission for acute on chronic respiratory failure secondary to COPD exacerbation, atelectasis, possible community-acquired pneumonia. Acute metabolic encephalopathy with recent stroke and possible vascular dementia. Patient with minimal clinical improvement despite aggressive medical management and poor oral intake. Dyspnea with exertion limiting functional status and poor candidate for rehabilitation. Palliative medicine consultation for goals of care/terminal care.   Clinical Assessment and Goals of Care: PMT consult received and chart reviewed. Discussed with Dr. Loleta Books and RN.   Upon arrival to room, patient wakes to voice. She is oriented to person and 'hospital' but otherwise disoriented. Able to answer simple questions. Denies pain or current dyspnea. Very weak and deconditioned. Currently on 2L oxygen. No family at bedside.   Spoke with daughter, Lynelle Smoke via telephone to discuss goals of care. Introduced role of palliative medicine.   Patient suffered a stroke in April 2021. Prior to this, living home alone but with support from family. Fairly independent but was requiring increased assistance with ADL's. COPD diagnosis for many years and requiring home oxygen. Dyspnea with exertion.   Discussed events leading up to admission and course of hospitalization including diagnoses, interventions, plan of care. Tammy has spoken with Dr. Loleta Books and Dr. Lanney Gins and  understands diagnoses and poor prognosis. Although documentation says the patient ate 100% of dinner 5/17, Tammy reports that she fed her mother last night and she did not even eat half of her meal. Tammy's brother fed her breakfast this morning and she only at a french toast stick. She required IVF yesterday.   I attempted to elicit values and goals of care important to the patient and daughter. Tammy reviewed her mother's living will last night and confirms her mother's wish for DNR code status. Tammy states "she doesn't want to just lay there." Tammy shares that her mother is "ready to go" and "tired."   Discussed original plan for SNF rehab and palliative. Tammy now understands and agrees with recommendation for hospice facility placement with poor long-term prognosis due to extremely poor oral intake and failure to thrive. We discussed that her mother cannot tolerate aggressive physical therapy and becomes easily short of breath with minimal exertion even in the bed. Tammy is agreeable with hospice facility referral and understands hospice liaison will contact her today.  Questions and concerns were addressed. Reassured of ongoing support this admission.  Discussed with hospice liaison, Olivia Mackie.     SUMMARY OF RECOMMENDATIONS    Daughter confirms DNR code status. Daughter has reviewed her mother's living will and confirms her wishes against life-prolonging interventions.   Minimal improvement despite aggressive medical management for 6  days. Daughter is ready and agreeable with hospice facility referral. [facility] following. Transition towards comfort focused care plan.  Continue comfort feeds per patient/family request. Requires assist with feeding.  Continue frequent oral care and repositioning.  Initiate prn Roxanol for pain/dyspnea.   Code Status/Advance Care Planning:  DNR  Symptom Management:   Roxanol 5mg  SL q4h prn pain/dyspnea/air hunger/tachypnea  Palliative Prophylaxis:    Aspiration, Delirium Protocol, Frequent Pain Assessment, Oral Care and Turn Reposition  Psycho-social/Spiritual:   Desire for further Chaplaincy support: yes  Additional Recommendations: Caregiving  Support/Resources, Compassionate Wean Education and Education on Hospice  Prognosis:   Poor long-term prognosis likely weeks with acute on chronic respiratory failure secondary to COPD and atelectasis, CAP, recent CVA with worsening vascular dementia and adult failure to thrive. Very poor oral intake. Not a candidate for rehab with dyspnea on minimal exertion.  Discharge Planning: Hospice facility      Primary Diagnoses: Present on Admission: . Near syncope . COPD exacerbation ([place])   I have reviewed the medical record, interviewed the patient and family, and examined the patient. The following aspects are pertinent.  Past Medical History:  Diagnosis Date  . COPD (chronic obstructive pulmonary disease) ([place])   . DNR (do not resuscitate) 11/2019   DNR/DNI, confirmed in person by patient and her daughter/POA  . History of chicken pox   . Hyperlipidemia   . Hypertension   . Ulcer    Social History   Socioeconomic History  . Marital status: Widowed    Spouse name: Not on file  . Number of children: 3  . Years of education: Not on file  . Highest education level: 12th grade  Occupational History  . Occupation: Retired    Comment: Worked in Office manager  . Smoking status: Former Smoker    Packs/day: 1.00    Years: 30.00    Pack years: 30.00    Types: Cigarettes  . Smokeless tobacco: Never Used  . Tobacco comment: quit around 1989  Substance and Sexual Activity  . Alcohol use: No  . Drug use: No  . Sexual activity: Not on file  Other Topics Concern  . Not on file  Social History Narrative  . Not on file   Social Determinants of Health   Financial Resource Strain:   . Difficulty of Paying Living Expenses:   Food Insecurity:   . Worried About Paediatric nurse in the Last Year:   . Arboriculturist in the Last Year:   Transportation Needs:   . Film/video editor (Medical):   Marland Kitchen Lack of Transportation (Non-Medical):   Physical Activity: Inactive  . Days of Exercise per Week: 0 days  . Minutes of Exercise per Session: 0 min  Stress:   . Feeling of Stress :   Social Connections: Unknown  . Frequency of Communication with Friends and Family: Patient refused  . Frequency of Social Gatherings with Friends and Family: Patient refused  . Attends Religious Services: Patient refused  . Active Member of Clubs or Organizations: Patient refused  . Attends Archivist Meetings: Patient refused  . Marital Status: Patient refused   Family History  Problem Relation Age of Onset  . Heart disease Father   . Congestive Heart Failure Father   . Heart disease Mother   . Congestive Heart Failure Mother   . Cancer Brother        lung  . Lung cancer Brother   . Diabetes Daughter   .  Diabetes Daughter   . Breast cancer Neg Hx    Scheduled Meds: . amLODipine  5 mg Oral Daily  . aspirin  81 mg Oral Daily  . citalopram  20 mg Oral Daily  . clopidogrel  75 mg Oral Daily  . enoxaparin (LOVENOX) injection  40 mg Subcutaneous Q24H  . gabapentin  100 mg Oral BID  . levothyroxine  37.5 mcg Oral Q0600  . metoprolol succinate  25 mg Oral Daily  . potassium chloride  40 mEq Oral Once  . pravastatin  40 mg Oral QHS  . predniSONE  20 mg Oral Q breakfast  . sodium chloride flush  3 mL Intravenous Once  . sodium chloride flush  3 mL Intravenous Q12H   Continuous Infusions: . cefTRIAXone (ROCEPHIN)  IV Stopped (12/22/19 2200)   PRN Meds:.acetaminophen **OR** acetaminophen, alum & mag hydroxide-simeth, levalbuterol, magnesium hydroxide, morphine CONCENTRATE, ondansetron **OR** ondansetron (ZOFRAN) IV Medications Prior to Admission:  Prior to Admission medications   Medication Sig Start Date End Date Taking? Authorizing Provider  albuterol  (VENTOLIN HFA) 108 (90 BASE) MCG/ACT inhaler Inhale 2 puffs into the lungs. Every 4-6 hours as needed 08/31/11  Yes [provider]  alendronate (FOSAMAX) 70 MG tablet Take 1 tablet (70 mg total) by mouth once a week. Take with a full glass of water on an empty stomach. Patient taking differently: Take 70 mg by mouth once a week. Take with a full glass of water on an empty stomach. (SUNDAYS) 12/10/18  Yes Birdie Sons, MD  amLODipine (NORVASC) 5 MG tablet Take 1 tablet (5 mg total) by mouth daily. 12/06/19  Yes Nolberto Hanlon, MD  aspirin 81 MG tablet Take 81 mg by mouth daily.   Yes [provider]  Calcium-Magnesium-Vitamin D (CALCIUM 500 PO) Take 1 tablet by mouth 2 (two) times daily. 06/14/07  Yes [provider]  clopidogrel (PLAVIX) 75 MG tablet Take 1 tablet (75 mg total) by mouth daily. 12/06/19  Yes Nolberto Hanlon, MD  colchicine 0.6 MG tablet Take 2 tablets (1.2 mg) on first day of gout flare. Then, take 1 tablet (0.6mg ) daily until resolved. 12/08/19  Yes Birdie Sons, MD  Fluticasone-Salmeterol (ADVAIR) 250-50 MCG/DOSE AEPB Inhale 1 puff into the lungs 2 (two) times daily.   Yes [provider]  furosemide (LASIX) 20 MG tablet TAKE 1 TABLET TWICE DAILY AS NEEDED  FOR  SWELLING Patient taking differently: Take 20 mg by mouth daily.  06/24/19  Yes Birdie Sons, MD  gabapentin (NEURONTIN) 100 MG capsule Take 1 capsule (100 mg total) by mouth 2 (two) times daily. 03/24/19  Yes Birdie Sons, MD  levothyroxine (SYNTHROID) 25 MCG tablet TAKE 1 AND 1/2 TABLETS EVERY DAY BEFORE BREAKFAST 06/06/19  Yes Birdie Sons, MD  metoprolol succinate (TOPROL-XL) 50 MG 24 hr tablet TAKE 1/2 TABLET EVERY DAY 02/27/19  Yes Birdie Sons, MD  Multiple Vitamins-Minerals (PRESERVISION AREDS 2+MULTI VIT) CAPS Take 1 capsule by mouth 2 (two) times daily.   Yes [provider]  pantoprazole (PROTONIX) 40 MG tablet TAKE 1 TABLET EVERY DAY 07/24/19  Yes Birdie Sons, MD  pravastatin (PRAVACHOL) 40 MG tablet TAKE 1 TABLET (40 MG TOTAL) BY MOUTH AT BEDTIME. 12/31/18  Yes Birdie Sons, MD  roflumilast (DALIRESP) 500 MCG TABS tablet Take 500 mcg by mouth daily.   Yes [provider]  tiotropium (SPIRIVA) 18 MCG inhalation capsule Place 18 mcg into inhaler and inhale daily.   Yes  [provider]  traMADol (ULTRAM) 50 MG tablet TAKE 1 TABLET BY MOUTH EVERY 8 HOURS 11/27/19  Yes Birdie Sons, MD   Allergies  Allergen Reactions  . Flexeril  [Cyclobenzaprine Hcl]     Confusion  . Reglan [Metoclopramide] Hives  . Relafen [Nabumetone] Hives  . Risperdal  [Risperidone]     Confusion Rash  . Sulfa Antibiotics Hives  . Tape Other (See Comments)    Per patient pulls her skin off.  . Triamterene-Hctz     Hypercalcemia  . Hctz [Hydrochlorothiazide] Palpitations   Review of Systems  Unable to perform ROS: Mental status change   Physical Exam Vitals and nursing note reviewed.  Constitutional:      General: She is awake.     Appearance: She is ill-appearing.  HENT:     Head: Normocephalic and atraumatic.  Cardiovascular:     Heart sounds: Normal heart sounds.  Pulmonary:     Effort: No tachypnea, accessory muscle usage or respiratory distress.     Breath sounds: Decreased breath sounds present.     Comments: Dyspnea with exertion. 2L [place] Abdominal:     Tenderness: There is no abdominal tenderness.  Skin:    General: Skin is warm and dry.  Neurological:     Mental Status: She is alert.     Comments: Oriented to person and 'hospital.' Disoriented to time/situation. Pleasant confusion.  Psychiatric:        Mood and Affect: Mood normal.        Speech: Speech normal.    Vital Signs: BP (!) 155/55 (BP Location: Left Arm)   Pulse 92   Temp 98.7 F (37.1 C)   Resp 17   Ht 5\' 1"  (1.549 m)   Wt 70.9 kg   SpO2 97%   BMI 29.53 kg/m  Pain Scale: 0-10   Pain Score: 0-No pain   SpO2: SpO2: 97 % O2 Device:SpO2: 97 % O2 Flow  Rate: .O2 Flow Rate (L/min): 2 L/min  IO: Intake/output summary:   Intake/Output Summary (Last 24 hours) at 12/23/2019 1107 Last data filed at 12/23/2019 [id number] Gross per 24 hour  Intake 516.55 ml  Output 600 ml  Net -83.45 ml    LBM: Last BM Date: 12/19/19 Baseline Weight: Weight: 77.1 kg Most recent weight: Weight: 70.9 kg     Palliative Assessment/Data: PPS 20%   Flowsheet Rows     Most Recent Value  Intake Tab  Referral Department  Hospitalist  Unit at Time of Referral  Med/Surg Unit  Palliative Care Primary Diagnosis  Pulmonary  Palliative Care Type  New Palliative care  Reason for referral  Clarify Goals of Care  Date first seen by Palliative Care  12/23/19  Clinical Assessment  Palliative Performance Scale Score  20%  Psychosocial & Spiritual Assessment  Palliative Care Outcomes  Patient/Family meeting held?  Yes  Who was at the meeting?  daughter  Palliative Care Outcomes  Clarified goals of care, Improved pain interventions, Improved non-pain symptom therapy, Counseled regarding hospice, Provided end of life care assistance, Provided psychosocial or spiritual support, Transitioned to hospice      Time In: 1000 Time Out: 1100 Time Total: 22min Greater than 50%  of this time was spent counseling and coordinating care related to the above assessment and plan.  Signed by:  Ihor Dow, DNP, FNP-C Palliative Medicine Team  Phone: [phone] Fax: [phone]   Please contact Palliative Medicine Team phone at [phone] for questions and concerns.  For individual provider:  See Amion

## 2019-12-23 NOTE — TOC Transition Note (Signed)
Transition of Care [facility]) - CM/SW Discharge Note   Patient Details  Name: NOTA BEW MRN: [id number] Date of Birth: 1936/05/28  [facility]) CM/SW Contact:  Shelbie Ammons, RN Phone Number: 12/23/2019, 3:29 PM   Clinical Narrative:   RNCM received call from attending that patient will likely be ready for d/c today and to please follow up with patient/daughter re there decision.  Placed call to daughter who reports that if patient needs to choose a facility it will be [facility] but she feels more strongly that patient needs Hospice services and would like for that to be [facility] as they are the one that has the Hospice home should she need it. Placed call to Margaretmary Eddy with Hospice to inform her of referral.  Received word back from [place] that daughter after speaking with MD has decided to wait until tomorrow to make decision and allow patient to have one more day to see if she begins eating or not.       Barriers to Discharge: Continued Medical Work up   Patient Goals and CMS Choice Patient states their goals for this hospitalization and ongoing recovery are:: daughter stated to get rehab CMS Medicare.gov Compare Post Acute Care list provided to:: Patient Represenative (must comment) Choice offered to / list presented to : Adult Children  Discharge Placement                       Discharge Plan and Services     Post Acute Care Choice: Skilled Nursing Facility                               Social Determinants of Health (SDOH) Interventions     Readmission Risk Interventions No flowsheet data found.

## 2019-12-23 NOTE — Progress Notes (Signed)
PROGRESS NOTE    Toni White  [id number] DOB: 1936-07-29 DOA: 12/16/2019 PCP: Birdie Sons, MD      Brief Narrative:  Toni White is a 84 y.o. F with hx COPD on home O2 2L, FEV1 22%, recent stroke, and HTN who presented with few days progressive DOE and then pre-syncope.  Patient was getting ready to get in the car, and slumped to the ground weak, nearly passed out.  In the ER, ECG and troponins normal (there was an initial spurious elevated troponin, repeats both normal).  CTA chest showed no pneumonia or PE.        Assessment & Plan:  Acute on chronic hypoxic respiratory failure due to COPD exacerbation and atelectasis Patient was admitted started on steroids and bronchodilators.  Pulmonology were consulted, added Zithromax, MetaNebs, Mucomyst.  The patient unfortunately developed worsening hypoxemia at rest.  There was some degree of acute pulmonary edema treated with Lasix.  Follow up CXR clear.  Subsequently, patient with minimal improvement in respiratory status despite steroids, bronchodilators, aggressive chest PT with MetaNebs.  I agree with pulmonology, likely this is the recruitment of alveoli due to inactivity post stroke superimposed on her severe chronic COPD with FEV1 less than 20%.    -Continue prednisone taper, down to 10 mg daily today -Continue Xopenex, MetaNebs     Possible community-acquired pneumonia Patient developed progressive leukocytosis, procalcitonin rose to 0.16, started on Ceftriaxone.    White count, procalcitonin now trending back down on antibiotics.  However no overall clinical improvement -Continue ceftriaxone, day 4 of 5  Acute metabolic encephalopathy The patient has developed progressive metabolic encephalopathy.  This was fluctuating for several days, but now seems progressing.  She is currently only able to state her name and hospital, otherwise is disoriented.  She has no significant metabolic derangements/electrolyte  derangements, she has no significant underlying infection.  We suspect that this is all from her chronic respiratory failure and recent neurologic event last month (a vascular dementia like picture) and that there is limited improvement expected.  Her overall prognosis is worsening.  Appears to be failing to thrive. -Consult palliative care, hospice.  Hypernatremia The patient is able to eat only a few bites of food with each meal.  Sodium improved.  Pre-syncope This was the initial presenting complaint, patient orthostatic on arrival.  Given fluids, metoprolol held, orthostatics resolved.  Hypertension BP mostly controlled -Continue amlodipine and metoprolol  Recent TIA event The patient admitted last month for slurred speech visual hallucinations which were acute in onset.  MRI showed no stroke, but advanced small vessel changes and chronic microhemorrhages which appeared progressive from prior imaging.  Vascular surgery and neurology were consulted, angiogram of the head neck was performed and no stent was needed.  She was discharged from there to SNF and was initially doing well until this presentation with progressive acute metabolic encephalopathy. -Continue aspirin and Plavix  Mood -Continue citalopram  Peripheral neuropathy -Continue gabapentin  Hypothyroidism -Continue levothyroxine       Disposition: Status is: Inpatient  The patient requires ongoing hospital level care due to her persistent severity of symptoms  Dispo: The patient is from: Home              Anticipated d/c is to: SNF               Anticipated d/c date is: Unclear, possibly few more days  The patient appears to be deteriorating signfiicantly.  We will involve Hospice.  If she is able to take more by mouth over the next 24 hours, will attempt to transition to rehab with Palliative following, with goal to monitor ability to rehab over a weeks time.    If she fails oral intake in  next 24 hours, given her inability to stand for last week, her progressive encephalopathy and hypernatremia, I would recommend inpatient Hospice                   MDM: The below labs and imaging reports reviewed and summarized above.  Medication management as above.    DVT prophylaxis: Lovenox Code Status: DNR Family Communication: Daughter at the bedside    Consultants:   Pulmonology  Palliative care  Procedures:     Antimicrobials:   Azithromycin 5/12 >>5\16  Ceftriaxone 5/15>>  Culture data:              Subjective: Patient is afebrile, but listless, weak, confused, without appetite.  No vomiting, diarrhea, discomfort in the chest or abdomen.      Objective: Vitals:   12/22/19 2121 12/22/19 2123 12/22/19 2125 12/23/19 0812  BP: (!) 151/68 (!) 153/67 138/78 (!) 155/55  Pulse: 86 92 (!) 102 92  Resp: 20   17  Temp: 98 F (36.7 C)   98.7 F (37.1 C)  TempSrc: Oral     SpO2: 97% 96% 93% 97%  Weight:      Height:        Intake/Output Summary (Last 24 hours) at 12/23/2019 1112 Last data filed at 12/23/2019 [id number] Gross per 24 hour  Intake 516.55 ml  Output 600 ml  Net -83.45 ml   Filed Weights   12/20/19 0500 12/21/19 0500 12/22/19 0423  Weight: 71.3 kg 73.7 kg 70.9 kg    Examination: General appearance: Frail elderly female, lying in bed, appears debilitated and weak.     HEENT: Anicteric, conjunctival pink, lids and lashes normal.  No nasal deformity, discharge, or epistaxis, nasal cannula in place Skin: No suspicious rashes or lesions  cardiac: Tachycardic, no lower extremity edema, JVP normal  respiratory: Tachypneic at rest, lung sounds diminished, wheezing bilaterally Abdomen: No tenderness palpation, no guarding, no hepatosplenomegaly MSK: Diminished, muscle mass diffusely, diminished subcutaneous fat severely. Neuro: Awake but encephalopathic, generally weak, oriented to self and "hospital" only, speech slow and  halting Psych: A affect flat, judgment insight appear impaired by dementia       Data Reviewed: I have personally reviewed following labs and imaging studies:  CBC: Recent Labs  Lab 12/19/19 0411 12/20/19 0729 12/21/19 0633 12/22/19 0631 12/23/19 0528  WBC 18.1* 25.6* 17.8* 15.8* 11.1*  HGB 12.1 12.4 12.3 13.1 12.7  HCT 37.7 38.6 38.4 41.5 40.0  MCV 92.6 92.8 92.8 91.8 92.2  PLT 484* 392 370 442* [id number]   Basic Metabolic Panel: Recent Labs  Lab 12/19/19 0411 12/20/19 0729 12/21/19 0633 12/22/19 0631 12/23/19 0528  NA 145 145 147* 146* 142  K 2.9* 3.3* 3.7 3.3* 4.1  CL 102 106 106 102 103  CO2 29 31 30  32 31  GLUCOSE 96 105* 94 103* 94  BUN 22 20 24* 25* 24*  CREATININE 0.58 0.41* 0.38* 0.52 0.50  CALCIUM 9.1 8.9 9.0 9.3 9.2  MG 1.8  --   --   --   --    GFR: Estimated Creatinine Clearance: 49.6 mL/min (by C-G formula based on SCr of 0.5 mg/dL). Liver  Function Tests: No results for input(s): AST, ALT, ALKPHOS, BILITOT, PROT, ALBUMIN in the last 168 hours. No results for input(s): LIPASE, AMYLASE in the last 168 hours. No results for input(s): AMMONIA in the last 168 hours. Coagulation Profile: Recent Labs  Lab 12/17/19 0737  INR 1.1   Cardiac Enzymes: No results for input(s): CKTOTAL, CKMB, CKMBINDEX, TROPONINI in the last 168 hours. BNP (last 3 results) No results for input(s): PROBNP in the last 8760 hours. HbA1C: No results for input(s): HGBA1C in the last 72 hours. CBG: Recent Labs  Lab 12/20/19 0508 12/21/19 0530 12/21/19 0723 12/22/19 0419 12/23/19 0556  GLUCAP 104* 88 87 106* 87   Lipid Profile: No results for input(s): CHOL, HDL, LDLCALC, TRIG, CHOLHDL, LDLDIRECT in the last 72 hours. Thyroid Function Tests: No results for input(s): TSH, T4TOTAL, FREET4, T3FREE, THYROIDAB in the last 72 hours. Anemia Panel: No results for input(s): VITAMINB12, FOLATE, FERRITIN, TIBC, IRON, RETICCTPCT in the last 72 hours. Urine analysis:    Component  Value Date/Time   COLORURINE YELLOW (A) 12/16/2019 1504   APPEARANCEUR HAZY (A) 12/16/2019 1504   APPEARANCEUR Hazy 01/26/2014 1040   LABSPEC 1.030 12/16/2019 1504   LABSPEC 1.017 01/26/2014 1040   PHURINE 6.0 12/16/2019 1504   GLUCOSEU NEGATIVE 12/16/2019 1504   GLUCOSEU Negative 01/26/2014 1040   HGBUR NEGATIVE 12/16/2019 1504   BILIRUBINUR NEGATIVE 12/16/2019 1504   BILIRUBINUR Negative 01/26/2014 1040   KETONESUR 20 (A) 12/16/2019 1504   PROTEINUR NEGATIVE 12/16/2019 1504   NITRITE NEGATIVE 12/16/2019 1504   LEUKOCYTESUR SMALL (A) 12/16/2019 1504   LEUKOCYTESUR Negative 01/26/2014 1040   Sepsis Labs: @LABRCNTIP (procalcitonin:4,lacticacidven:4)  ) Recent Results (from the past 240 hour(s))  SARS Coronavirus 2 by RT PCR (hospital order, performed in [place] hospital lab) Nasopharyngeal Nasopharyngeal Swab     Status: None   Collection Time: 12/16/19  9:14 PM   Specimen: Nasopharyngeal Swab  Result Value Ref Range Status   SARS Coronavirus 2 NEGATIVE NEGATIVE Final    Comment: (NOTE) SARS-CoV-2 target nucleic acids are NOT DETECTED. The SARS-CoV-2 RNA is generally detectable in upper and lower respiratory specimens during the acute phase of infection. The lowest concentration of SARS-CoV-2 viral copies this assay can detect is 250 copies / mL. A negative result does not preclude SARS-CoV-2 infection and should not be used as the sole basis for treatment or other patient management decisions.  A negative result may occur with improper specimen collection / handling, submission of specimen other than nasopharyngeal swab, presence of viral mutation(s) within the areas targeted by this assay, and inadequate number of viral copies (<250 copies / mL). A negative result must be combined with clinical observations, patient history, and epidemiological information. Fact Sheet for Patients:   StrictlyIdeas.no Fact Sheet for Healthcare  Providers: BankingDealers.co.za This test is not yet approved or cleared  by the [place] FDA and has been authorized for detection and/or diagnosis of SARS-CoV-2 by FDA under an Emergency Use Authorization (EUA).  This EUA will remain in effect (meaning this test can be used) for the duration of the COVID-19 declaration under Section 564(b)(1) of the Act, 21 U.S.C. section 360bbb-3(b)(1), unless the authorization is terminated or revoked sooner. Performed at [facility], [address] [place], [place] [address]          Radiology Studies: No results found.      Scheduled Meds: . amLODipine  5 mg Oral Daily  . aspirin  81 mg Oral Daily  . citalopram  20 mg Oral Daily  . clopidogrel  75 mg Oral Daily  . enoxaparin (LOVENOX) injection  40 mg Subcutaneous Q24H  . gabapentin  100 mg Oral BID  . levothyroxine  37.5 mcg Oral Q0600  . metoprolol succinate  25 mg Oral Daily  . potassium chloride  40 mEq Oral Once  . pravastatin  40 mg Oral QHS  . predniSONE  20 mg Oral Q breakfast  . sodium chloride flush  3 mL Intravenous Once  . sodium chloride flush  3 mL Intravenous Q12H   Continuous Infusions: . cefTRIAXone (ROCEPHIN)  IV Stopped (12/22/19 2200)     LOS: 5 days    Time spent: 35 minutes    Edwin Dada, MD [facility] 12/23/2019, 11:12 AM     Please page though [place] or Epic secure chat:  For password, contact charge nurse

## 2019-12-23 NOTE — Progress Notes (Signed)
[place] entered room and found patient more alert, and feeding herself. Noted that she ate many bites while also picking up her cup to drink. Tolerated well.... MD Notified.

## 2019-12-24 LAB — GLUCOSE, CAPILLARY: Glucose-Capillary: 90 mg/dL (ref 70–99)

## 2019-12-24 MED ORDER — CEFDINIR 300 MG PO CAPS
300.0000 mg | ORAL_CAPSULE | Freq: Two times a day (BID) | ORAL | 0 refills | Status: AC
Start: 2019-12-24 — End: 2019-12-29

## 2019-12-24 MED ORDER — PREDNISONE 10 MG PO TABS: 10.0000 mg | ORAL_TABLET | Freq: Every day | ORAL | 0 refills | Status: AC

## 2019-12-24 NOTE — Progress Notes (Signed)
Telephone report given to Rosanne Ashing, RN at hospice house.

## 2019-12-24 NOTE — Progress Notes (Signed)
Daily Progress Note   Patient Name: Toni White       Date: 12/24/2019 DOB: 02/28/1936  Age: 84 y.o. MRN#: [id number] Attending Physician: Sharen Hones, MD Primary Care Physician: Birdie Sons, MD Admit Date: 12/16/2019  Reason for Consultation/Follow-up: Establishing goals of care  Subjective: Patient awake, alert to person/place. [facility] hospice liaison at bedside assisting with breakfast. Toni White only accepts a few bites of french toast. Denies current pain or shortness of breath. She is very weak.  GOC: F/u with daughter, Toni White regarding plan of care. Toni White remains with very poor oral intake and weakness. Minimal improvement despite aggressive medical management. She is not rehabable because of dyspnea with minimal exertion and high risk for recurrent hospitalization. Toni White wishes to focus on comfort and get hospice involved.  Discussed hospice options and philosophy including focus on comfort, symptom management, allowing nature to take course. Discussed pending hospice facility evaluation. Explained that medications/interventions not aimed at comfort will be discontinued when transferred to hospice facility.   Therapeutic listening and emotional support provided. Answered questions. PMT contact information given.   Discussed in detail with RN CM and [facility] hospice liaison.    Length of Stay: 6  Current Medications: Scheduled Meds:  . amLODipine  5 mg Oral Daily  . aspirin  81 mg Oral Daily  . citalopram  20 mg Oral Daily  . clopidogrel  75 mg Oral Daily  . enoxaparin (LOVENOX) injection  40 mg Subcutaneous Q24H  . gabapentin  100 mg Oral BID  . levothyroxine  37.5 mcg Oral Q0600  . metoprolol succinate  25 mg Oral Daily  . potassium chloride  40 mEq Oral Once  .  pravastatin  40 mg Oral QHS  . predniSONE  10 mg Oral Q breakfast  . sodium chloride flush  3 mL Intravenous Once  . sodium chloride flush  3 mL Intravenous Q12H    Continuous Infusions: . cefTRIAXone (ROCEPHIN)  IV 1 g (12/23/19 1746)    PRN Meds: acetaminophen **OR** acetaminophen, alum & mag hydroxide-simeth, levalbuterol, magnesium hydroxide, morphine CONCENTRATE, ondansetron **OR** ondansetron (ZOFRAN) IV  Physical Exam Vitals and nursing note reviewed.  Constitutional:      Appearance: She is cachectic. She is ill-appearing.  Cardiovascular:     Heart sounds: Normal heart sounds.  Pulmonary:  Effort: No tachypnea, accessory muscle usage or respiratory distress.     Comments: Dyspnea with exertion Abdominal:     Tenderness: There is no abdominal tenderness.  Skin:    General: Skin is warm and dry.  Neurological:     Mental Status: She is easily aroused.     Comments: Oriented to person and 'hospital.' Forgetful, dementia  Psychiatric:        Mood and Affect: Mood normal.        Speech: Speech normal.        Cognition and Memory: Cognition is impaired.            Vital Signs: BP (!) 157/76 (BP Location: Left Arm)   Pulse 85   Temp (!) 97.5 F (36.4 C) (Oral)   Resp 18   Ht 5\' 1"  (1.549 m)   Wt 70.7 kg   SpO2 98%   BMI 29.46 kg/m  SpO2: SpO2: 98 % O2 Device: O2 Device: Nasal Cannula O2 Flow Rate: O2 Flow Rate (L/min): 2 L/min  Intake/output summary:   Intake/Output Summary (Last 24 hours) at 12/24/2019 1111 Last data filed at 12/23/2019 2034 Gross per 24 hour  Intake --  Output 1400 ml  Net -1400 ml   LBM: Last BM Date: 12/19/19 Baseline Weight: Weight: 77.1 kg Most recent weight: Weight: 70.7 kg       Palliative Assessment/Data: PPS 20%    Flowsheet Rows     Most Recent Value  Intake Tab  Referral Department  Hospitalist  Unit at Time of Referral  Med/Surg Unit  Palliative Care Primary Diagnosis  Pulmonary  Palliative Care Type  New  Palliative care  Reason for referral  Clarify Goals of Care  Date first seen by Palliative Care  12/23/19  Clinical Assessment  Palliative Performance Scale Score  20%  Psychosocial & Spiritual Assessment  Palliative Care Outcomes  Patient/Family meeting held?  Yes  Who was at the meeting?  daughter  Palliative Care Outcomes  Clarified goals of care, Improved pain interventions, Improved non-pain symptom therapy, Counseled regarding hospice, Provided end of life care assistance, Provided psychosocial or spiritual support, Transitioned to hospice      Patient Active Problem List   Diagnosis Date Noted  . Acute metabolic encephalopathy   . Weakness   . Palliative care by specialist   . Goals of care, counseling/discussion   . Near syncope 12/17/2019  . COPD exacerbation ([place]) 12/17/2019  . CVA (cerebral vascular accident) ([place]) 12/02/2019  . Aphasia 12/02/2019  . Dysarthria   . Carotid stenosis, left   . Chronic respiratory failure ([place])   . TIA (transient ischemic attack)   . Varicose veins of both legs with edema 05/02/2017  . Recurrent major depressive disorder, in partial remission ([place]) 06/08/2015  . Hypersomnolence 06/08/2015  . Macular degeneration 06/08/2015  . Sciatica 06/08/2015  . LBP (low back pain) 06/08/2015  . OP (osteoporosis) 06/08/2015  . Bibasilar crackles 06/08/2015  . Edema extremities 06/08/2015  . Tremor 03/17/2015  . 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 Diffuse cystic mastopathy 06/26/2013  . Myalgia and myositis 07/06/2009  . Hypoxemia 07/06/2009  . Hyperlipidemia 05/19/2008  . Prediabetes 12/18/2007  . One eye: total vision impairment; other eye: normal vision 06/29/2007  . Hypertension 08/07/1998  . Chronic obstructive pulmonary disease ([place]) 08/07/1998    Palliative Care Assessment & Plan   Patient Profile: 84 y.o. female  with past medical history of COPD on home oxygen 2L, recent stroke, HTN admitted  on 12/16/2019 with  progressive dyspnea on exertion and pre-syncope. Hospital admission for acute on chronic respiratory failure secondary to COPD exacerbation, atelectasis, possible community-acquired pneumonia. Acute metabolic encephalopathy with recent stroke and possible vascular dementia. Patient with minimal clinical improvement despite aggressive medical management and poor oral intake. Dyspnea with exertion limiting functional status and poor candidate for rehabilitation. Palliative medicine consultation for goals of care/terminal care.   Assessment: Acute on chronic hypoxic respiratory failure COPD exacerbation Atelectasis Acute pulmonary edema Possible community-acquired pneumonia Acute metabolic encephalopathy Hypernatremia Recent TIA Poor oral intake Adult failure to thrive Peripheral neuropathy  Recommendations/Plan:  Minimal improvement despite aggressive medical management for 6 days. Daughter is ready and agreeable with hospice facility referral. 1st choice: [place] facility. 2nd choice: [facility] facility. Pending [facility] evaluation.   Continue comfort feeds per patient/family request. Requires assist with feeding.  Continue frequent oral care and repositioning.  Initiate prn Roxanol for pain/dyspnea.  Code Status: DNR/DNI   Code Status Orders  (From admission, onward)         Start     Ordered   12/17/19 0348  Do not attempt resuscitation (DNR)  Continuous    Question Answer Comment  In the event of cardiac or respiratory ARREST Do not call a "code blue"   In the event of cardiac or respiratory ARREST Do not perform Intubation, CPR, defibrillation or ACLS   In the event of cardiac or respiratory ARREST Use medication by any route, position, wound care, and other measures to relive pain and suffering. May use oxygen, suction and manual treatment of airway obstruction as needed for comfort.      12/17/19 0347        Code Status History     Date Active Date Inactive Code Status Order ID Comments User Context   12/02/2019 0149 12/06/2019 2058 DNR [id number]  Christel Mormon, MD ED   12/02/2019 0014 12/02/2019 0149 DNR [id number]  Hinda Kehr, MD ED   Advance Care Planning Activity    Advance Directive Documentation     Most Recent Value  Type of Advance Directive  Healthcare Power of Attorney  Pre-existing out of facility DNR order (yellow form or pink MOST form)  --  "MOST" Form in Place?  --       Prognosis:  Poor long-term prognosis likely weeks with acute on chronic respiratory failure secondary to COPD and atelectasis, CAP, recent CVA with worsening vascular dementia and adult failure to thrive. Very poor oral intake. Not a candidate for rehab with dyspnea on minimal exertion.  Discharge Planning:  Pending hospice home evaluation  Care plan was discussed with patient, RN CM, [facility] hospice liaison, daughter Lynelle Smoke)  Thank you for allowing the Palliative Medicine Team to assist in the care of this patient.   Time In: 1040 Time Out: 1120 Total Time 40 Prolonged Time Billed no      Greater than 50%  of this time was spent counseling and coordinating care related to the above assessment and plan.  Ihor Dow, DNP, FNP-C Palliative Medicine Team  Phone: [phone] Fax: [phone]  Please contact Palliative Medicine Team phone at [phone] for questions and concerns.

## 2019-12-24 NOTE — Progress Notes (Signed)
Pulmonary Medicine          Date: 12/24/2019,   MRN# [id number] Toni White 17-Feb-1936     AdmissionWeight: 77.1 kg                 CurrentWeight: 70.7 kg  Referring physician: Dr Loleta Books    CHIEF COMPLAINT:   Worsening shortness of breath at rest.    SUBJECTIVE   Patient is resting in bed this AM she is slightly more interactive today.   Palliative seen patient and goals of care have been established for comfort measures.    PAST MEDICAL HISTORY   Past Medical History:  Diagnosis Date  . COPD (chronic obstructive pulmonary disease) ([place])   . DNR (do not resuscitate) 11/2019   DNR/DNI, confirmed in person by patient and her daughter/POA  . History of chicken pox   . Hyperlipidemia   . Hypertension   . Ulcer      SURGICAL HISTORY   Past Surgical History:  Procedure Laterality Date  . ABDOMINAL HYSTERECTOMY  2003   with BSO due to bleeding  . APPENDECTOMY    . BREAST BIOPSY Left 2005   stereo breast biopsy benign  . BREAST BIOPSY Right 2005   core bx. benign  . BREAST BIOPSY Left 2000?   benign  . BREAST CYST EXCISION Left 1990   incision and draniage of abscess  . CAROTID PTA/STENT INTERVENTION Left 12/04/2019   Procedure: CAROTID PTA/STENT INTERVENTION;  Surgeon: Katha Cabal, MD;  Location: [facility];  Service: Cardiovascular;  Laterality: Left;  . CHOLECYSTECTOMY    . COLONOSCOPY  2003   Dr. Vira Agar. Hyperplastic polyps; Single polyp excision from rectum  . CT Scan of Brain  08/20/2013   Mild diffuse cortical atrophy. Mid chronic ischemic white matter disease. 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 [place]  . INCISION AND DRAINAGE BREAST ABSCESS Left 1990  . TONSILLECTOMY  1952  . UPPER GI ENDOSCOPY  02/26/2002   Dr. Tiffany Kocher; Gastritis. Single gastric ectasia     FAMILY HISTORY   Family History  Problem Relation Age of Onset  . Heart disease Father   . Congestive Heart  Failure Father   . Heart disease Mother   . Congestive Heart Failure Mother   . Cancer Brother        lung  . Lung cancer Brother   . Diabetes Daughter   . Diabetes Daughter   . Breast cancer Neg Hx      SOCIAL HISTORY   Social History   Tobacco Use  . Smoking status: Former Smoker    Packs/day: 1.00    Years: 30.00    Pack years: 30.00    Types: Cigarettes  . Smokeless tobacco: Never Used  . Tobacco comment: quit around 1989  Substance Use Topics  . Alcohol use: No  . Drug use: No     MEDICATIONS    Home Medication:    Current Medication:  Current Facility-Administered Medications:  .  acetaminophen (TYLENOL) tablet 650 mg, 650 mg, Oral, Q6H PRN **OR** acetaminophen (TYLENOL) suppository 650 mg, 650 mg, Rectal, Q6H PRN, Mansy, Jan A, MD .  alum & mag hydroxide-simeth (MAALOX/MYLANTA) 200-200-20 MG/5ML suspension 30 mL, 30 mL, Oral, Q6H PRN, Mansy, Jan A, MD .  amLODipine (NORVASC) tablet 5 mg, 5 mg, Oral, Daily, Mansy, Jan A, MD, 5 mg at 12/24/19 0907 .  aspirin chewable tablet 81 mg, 81  mg, Oral, Daily, Mansy, Jan A, MD, 81 mg at 12/24/19 0907 .  cefTRIAXone (ROCEPHIN) 1 g in sodium chloride 0.9 % 100 mL IVPB, 1 g, Intravenous, Q24H, Danford, Suann Larry, MD, Last Rate: 200 mL/hr at 12/23/19 1746, 1 g at 12/23/19 1746 .  citalopram (CELEXA) tablet 20 mg, 20 mg, Oral, Daily, Mansy, Jan A, MD, 20 mg at 12/24/19 0907 .  clopidogrel (PLAVIX) tablet 75 mg, 75 mg, Oral, Daily, Mansy, Jan A, MD, 75 mg at 12/24/19 0908 .  enoxaparin (LOVENOX) injection 40 mg, 40 mg, Subcutaneous, Q24H, Mansy, Jan A, MD, 40 mg at 12/24/19 0907 .  gabapentin (NEURONTIN) capsule 100 mg, 100 mg, Oral, BID, Mansy, Jan A, MD, 100 mg at 12/24/19 0907 .  levalbuterol (XOPENEX) nebulizer solution 0.63 mg, 0.63 mg, Nebulization, Q6H PRN, Danford, Suann Larry, MD .  levothyroxine (SYNTHROID) tablet 37.5 mcg, 37.5 mcg, Oral, Q0600, Mansy, Jan A, MD, 37.5 mcg at 12/24/19 0522 .  magnesium hydroxide  (MILK OF MAGNESIA) suspension 30 mL, 30 mL, Oral, Daily PRN, Mansy, Jan A, MD .  metoprolol succinate (TOPROL-XL) 24 hr tablet 25 mg, 25 mg, Oral, Daily, Danford, Suann Larry, MD, 25 mg at 12/24/19 0907 .  morphine CONCENTRATE 10 MG/0.5ML oral solution 5 mg, 5 mg, Sublingual, Q4H PRN, Basilio Cairo, NP .  ondansetron (ZOFRAN) tablet 4 mg, 4 mg, Oral, Q6H PRN **OR** ondansetron (ZOFRAN) injection 4 mg, 4 mg, Intravenous, Q6H PRN, Mansy, Jan A, MD .  potassium chloride SA (KLOR-CON) CR tablet 40 mEq, 40 mEq, Oral, Once, Danford, Suann Larry, MD .  pravastatin (PRAVACHOL) tablet 40 mg, 40 mg, Oral, QHS, Mansy, Jan A, MD, 40 mg at 12/23/19 2243 .  predniSONE (DELTASONE) tablet 10 mg, 10 mg, Oral, Q breakfast, Danford, Suann Larry, MD, 10 mg at 12/24/19 0907 .  sodium chloride flush (NS) 0.9 % injection 3 mL, 3 mL, Intravenous, Once, Duffy Bruce, MD .  sodium chloride flush (NS) 0.9 % injection 3 mL, 3 mL, Intravenous, Q12H, Mansy, Jan A, MD, 3 mL at 12/24/19 0908    ALLERGIES   Flexeril  [cyclobenzaprine hcl], Reglan [metoclopramide], Relafen [nabumetone], Risperdal  [risperidone], Sulfa antibiotics, Tape, Triamterene-hctz, and Hctz [hydrochlorothiazide]     REVIEW OF SYSTEMS    Review of Systems:  Gen:  Denies  fever, sweats, chills weigh loss  HEENT: Denies blurred vision, double vision, ear pain, eye pain, hearing loss, nose bleeds, sore throat Cardiac:  No dizziness, chest pain or heaviness, chest tightness,edema Resp:   Denies cough or sputum porduction, shortness of breath,wheezing, hemoptysis,  Gi: Denies swallowing difficulty, stomach pain, nausea or vomiting, diarrhea, constipation, bowel incontinence Gu:  Denies bladder incontinence, burning urine Ext:   Denies Joint pain, stiffness or swelling Skin: Denies  skin rash, easy bruising or bleeding or hives Endoc:  Denies polyuria, polydipsia , polyphagia or weight change Psych:   Denies depression, insomnia or  hallucinations   Other:  All other systems negative   VS: BP (!) 157/76 (BP Location: Left Arm)   Pulse 85   Temp (!) 97.5 F (36.4 C) (Oral)   Resp 18   Ht 5\' 1"  (1.549 m)   Wt 70.7 kg   SpO2 98%   BMI 29.46 kg/m      PHYSICAL EXAM    GENERAL:NAD, no fevers, chills, no weakness no fatigue HEAD: Normocephalic, atraumatic.  EYES: Pupils equal, round, reactive to light. Extraocular muscles intact. No scleral icterus.  MOUTH: Moist mucosal membrane. Dentition intact. No abscess noted.  EAR, NOSE, THROAT: Clear without exudates. No external lesions.  NECK: Supple. No thyromegaly. No nodules. No JVD.  PULMONARY: Mildly ronchorous breath sounds.  CARDIOVASCULAR: S1 and S2. Regular rate and rhythm. No murmurs, rubs, or gallops. No edema. Pedal pulses 2+ bilaterally.  GASTROINTESTINAL: Soft, nontender, nondistended. No masses. Positive bowel sounds. No hepatosplenomegaly.  MUSCULOSKELETAL: No swelling, clubbing, or edema. Range of motion full in all extremities.  NEUROLOGIC: Cranial nerves II through XII are intact. No gross focal neurological deficits. Sensation intact. Reflexes intact.  SKIN: No ulceration, lesions, rashes, or cyanosis. Skin warm and dry. Turgor intact.  PSYCHIATRIC: Mood, affect within normal limits. The patient is awake, alert and oriented x 3. Insight, judgment intact.       IMAGING    CT ANGIO HEAD W OR WO CONTRAST  Result Date: 12/02/2019 CLINICAL DATA:  Carotid artery stenosis. Recent slurred speech and mental status changes. Basilar tip aneurysm. Left carotid stenosis by ultrasound. EXAM: CT ANGIOGRAPHY HEAD AND NECK TECHNIQUE: Multidetector CT imaging of the head and neck was performed using the standard protocol during bolus administration of intravenous contrast. Multiplanar CT image reconstructions and MIPs were obtained to evaluate the vascular anatomy. Carotid stenosis measurements (when applicable) are obtained utilizing NASCET criteria, using the  distal internal carotid diameter as the denominator. CONTRAST:  67mL OMNIPAQUE IOHEXOL 350 MG/ML SOLN COMPARISON:  MRI same day. Ultrasound same day. MRI 08/26/2013. CT angiography 08/19/2013. FINDINGS: CT HEAD FINDINGS Brain: Brain atrophy with chronic small-vessel ischemic changes affecting the pons, thalami, basal ganglia and hemispheric white matter. No sign of acute infarction, mass lesion, hemorrhage, hydrocephalus or extra-axial collection. Vascular: There is atherosclerotic calcification of the major vessels at the base of the brain. 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 See below. Skull: Negative Sinuses: Clear sinuses. Orbits: Lens calcification on the right. Review of the MIP images confirms the above findings CTA NECK FINDINGS Aortic arch: Aortic atherosclerotic calcification. No aneurysm or dissection. Branching pattern is normal without flow limiting stenosis. Right carotid system: Common carotid artery widely patent to the bifurcation and ICA bulb. Somewhat irregular soft and calcified plaque affecting the ICA bulb with minimal diameter of 3.4 mm. Compared to a more distal cervical ICA diameter of 5 mm, this indicates a 30% stenosis. Vessels are tortuous, swinging to the midline behind the oropharynx. Left carotid system: Common carotid artery is patent to the bifurcation. Severe irregular calcified plaque at the carotid bifurcation and ICA bulb. Luminal stenosis likely 1 mm. Compared to a more distal cervical ICA diameter of 5 mm, this indicates an 80% stenosis. Vessels are tortuous, swinging to the midline behind the oropharynx. Vertebral arteries: Calcified plaque at both vertebral artery origin regions but without stenosis greater than 30%. Scattered areas of calcified plaque through the cervical course of both vertebral arteries but no stenosis greater than 30%. Skeleton: Degenerative cervical spondylosis. Other neck: No mass or lymphadenopathy. Upper chest: Emphysema and pulmonary scarring at the  apices. Review of the MIP images confirms the above findings CTA HEAD FINDINGS Anterior circulation: Both internal carotid arteries are patent through the skull base and siphon regions. Ordinary siphon atherosclerotic calcification but without stenosis greater than 30%. Anterior and middle cerebral vessels are patent without large or medium vessel occlusion, aneurysm or vascular malformation. Posterior circulation: Both vertebral arteries are patent through the foramen magnum. There is atherosclerotic disease at both vertebral artery V4 segments with stenosis estimated at 50% on both sides. Both vessels supply the basilar. No basilar stenosis. Basilar tip aneurysm with wide  mouth redemonstrated, unchanged in size at 9 x 6 x 6 mm. Venous sinuses: Patent and normal. Anatomic variants: None significant. Review of the MIP images confirms the above findings IMPRESSION: No acute large or medium vessel occlusion. Aortic Atherosclerosis (ICD10-I70.0) and Emphysema (ICD10-J43.9). Atherosclerotic disease at both carotid bifurcation and ICA bulb regions. 30% ICA stenosis on the right. 80% or greater irregular stenosis of the ICA bulb on the left. Vessels are tortuous, swinging to the midline behind the oropharynx. Atherosclerotic disease of both vertebral artery V4 segments with stenoses estimated at 50%. 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 Electronically Signed   By: Nelson Chimes M.D.   On: 12/02/2019 15:56   DG Chest 2 View  Result Date: 12/19/2019 CLINICAL DATA:  Cough, COPD, hypertension. EXAM: CHEST - 2 VIEW COMPARISON:  Chest x-rays dated 12/05/2019 and 12/02/2019. FINDINGS: Stable cardiomegaly. Lungs are clear. No pleural effusion or pneumothorax is seen. Probable hiatal hernia. No acute appearing osseous abnormality. IMPRESSION: 1. No active cardiopulmonary disease. No evidence of pneumonia or pulmonary edema. 2. Stable cardiomegaly. 3.  Probable hiatal hernia. Electronically Signed   By: Franki Cabot M.D.   On: 12/19/2019 13:13   CT Head Wo Contrast  Result Date: 12/16/2019 CLINICAL DATA:  Near syncopal episode today. EXAM: CT HEAD WITHOUT CONTRAST TECHNIQUE: Contiguous axial images were obtained from the base of the skull through the vertex without intravenous contrast. COMPARISON:  Brain MRI 12/02/2019.  Head CT scan 12/01/2019. FINDINGS: Brain: No evidence of acute infarction, hemorrhage, hydrocephalus, extra-axial collection or mass lesion/mass effect. Extensive chronic microvascular ischemic change again seen. Lacunar infarctions in the basal ganglia and thalami also again seen. Vascular: No hyperdense vessel or unexpected calcification. Skull: Intact.  No focal lesion. Sinuses/Orbits: Negative. Other: None. IMPRESSION: No acute abnormality. Extensive chronic microvascular ischemic disease. Electronically Signed   By: Inge Rise M.D.   On: 12/16/2019 21:40   CT HEAD WO CONTRAST  Result Date: 12/01/2019 CLINICAL DATA:  Slurred speech x2 days. EXAM: CT HEAD WITHOUT CONTRAST TECHNIQUE: Contiguous axial images were obtained from the base of the skull through the vertex without intravenous contrast. COMPARISON:  Dec 19, 2017 FINDINGS: Brain: There is mild cerebral atrophy with widening of the extra-axial spaces and ventricular dilatation. There are areas of decreased attenuation within the white matter tracts of the supratentorial brain, consistent with microvascular disease changes. Small chronic bilateral basal ganglia lacunar infarcts are seen. 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 Skull: Normal. Negative for fracture or focal lesion. Sinuses/Orbits: No acute finding. Other: None. IMPRESSION: 1. Generalized cerebral atrophy. 2. No acute intracranial abnormality. 3. Stable, approximately 5.3 mm diameter partially calcified, aneurysmal  dilatation of the tip of the basilar artery. 4. Small chronic bilateral basal ganglia lacunar infarcts. Electronically Signed   By: Virgina Norfolk M.D.   On: 12/01/2019 20:16   CT ANGIO NECK W OR WO CONTRAST  Result Date: 12/02/2019 CLINICAL DATA:  Carotid artery stenosis. Recent slurred speech and mental status changes. Basilar tip aneurysm. Left carotid stenosis by ultrasound. EXAM: CT ANGIOGRAPHY HEAD AND NECK TECHNIQUE: Multidetector CT imaging of the head and neck was performed using the standard protocol during bolus administration of intravenous contrast. Multiplanar CT image reconstructions and MIPs were obtained to evaluate the vascular anatomy. Carotid stenosis measurements (when applicable) are obtained utilizing NASCET criteria, using the distal internal carotid  diameter as the denominator. CONTRAST:  43mL OMNIPAQUE IOHEXOL 350 MG/ML SOLN COMPARISON:  MRI same day. Ultrasound same day. MRI 08/26/2013. CT angiography 08/19/2013. FINDINGS: CT HEAD FINDINGS Brain: Brain atrophy with chronic small-vessel ischemic changes affecting the pons, thalami, basal ganglia and hemispheric white matter. No sign of acute infarction, mass lesion, hemorrhage, hydrocephalus or extra-axial collection. Vascular: There is atherosclerotic calcification of the major vessels at the base of the brain. 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 See below. Skull: Negative Sinuses: Clear sinuses. Orbits: Lens calcification on the right. Review of the MIP images confirms the above findings CTA NECK FINDINGS Aortic arch: Aortic atherosclerotic calcification. No aneurysm or dissection. Branching pattern is normal without flow limiting stenosis. Right carotid system: Common carotid artery widely patent to the bifurcation and ICA bulb. Somewhat irregular soft and calcified plaque affecting the ICA bulb with minimal diameter of 3.4 mm. Compared to a more distal cervical ICA diameter of 5 mm, this indicates a 30% stenosis. Vessels are  tortuous, swinging to the midline behind the oropharynx. Left carotid system: Common carotid artery is patent to the bifurcation. Severe irregular calcified plaque at the carotid bifurcation and ICA bulb. Luminal stenosis likely 1 mm. Compared to a more distal cervical ICA diameter of 5 mm, this indicates an 80% stenosis. Vessels are tortuous, swinging to the midline behind the oropharynx. Vertebral arteries: Calcified plaque at both vertebral artery origin regions but without stenosis greater than 30%. Scattered areas of calcified plaque through the cervical course of both vertebral arteries but no stenosis greater than 30%. Skeleton: Degenerative cervical spondylosis. Other neck: No mass or lymphadenopathy. Upper chest: Emphysema and pulmonary scarring at the apices. Review of the MIP images confirms the above findings CTA HEAD FINDINGS Anterior circulation: Both internal carotid arteries are patent through the skull base and siphon regions. Ordinary siphon atherosclerotic calcification but without stenosis greater than 30%. Anterior and middle cerebral vessels are patent without large or medium vessel occlusion, aneurysm or vascular malformation. Posterior circulation: Both vertebral arteries are patent through the foramen magnum. There is atherosclerotic disease at both vertebral artery V4 segments with stenosis estimated at 50% on both sides. Both vessels supply the basilar. No basilar stenosis. 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 Venous sinuses: Patent and normal. Anatomic variants: None significant. Review of the MIP images confirms the above findings IMPRESSION: No acute large or medium vessel occlusion. Aortic Atherosclerosis (ICD10-I70.0) and Emphysema (ICD10-J43.9). Atherosclerotic disease at both carotid bifurcation and ICA bulb regions. 30% ICA stenosis on the right. 80% or greater irregular stenosis of the ICA bulb on the left. Vessels are tortuous,  swinging to the midline behind the oropharynx. Atherosclerotic disease of both vertebral artery V4 segments with stenoses estimated at 50%. 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 Electronically Signed   By: Nelson Chimes M.D.   On: 12/02/2019 15:56   CT Angio Chest PE W and/or Wo Contrast  Result Date: 12/16/2019 CLINICAL DATA:  Shortness of breath, near syncope EXAM: CT ANGIOGRAPHY CHEST WITH CONTRAST TECHNIQUE: Multidetector CT imaging of the chest was performed using the standard protocol during bolus administration of intravenous contrast. Multiplanar CT image reconstructions and MIPs were obtained to evaluate the vascular anatomy. CONTRAST:  72mL OMNIPAQUE IOHEXOL 350 MG/ML SOLN COMPARISON:  06/30/2011, 12/05/2019 FINDINGS: Cardiovascular: This is a technically adequate evaluation of the pulmonary vasculature. No filling defects or  pulmonary emboli. The heart is normal without pericardial effusion. There is extensive atherosclerosis of the thoracic aorta, with no aneurysm or dissection. Significant atherosclerosis throughout the coronary vasculature greatest in the LAD distribution. Mediastinum/Nodes: No enlarged mediastinal, hilar, or axillary lymph nodes. Thyroid gland, trachea, and esophagus demonstrate no significant findings. Small hiatal hernia is noted. Lungs/Pleura: Upper lobe predominant emphysema is seen, with scattered areas of air trapping. No airspace disease, effusion, or pneumothorax. Central airways are patent. Upper Abdomen: No acute abnormality. Musculoskeletal: No acute or destructive bony lesions. Reconstructed images demonstrate no additional findings. Review of the MIP images confirms the above findings. IMPRESSION: 1. No CT evidence of pulmonary embolism. 2. Small hiatal hernia. 3. Aortic Atherosclerosis (ICD10-I70.0) and Emphysema (ICD10-J43.9). Electronically Signed   By: Randa Ngo M.D.   On: 12/16/2019  21:40   MR BRAIN WO CONTRAST  Result Date: 12/02/2019 CLINICAL DATA:  Neuro deficit, acute, stroke suspected. Additional history provided: Acute onset slurred speech, headache, mild nausea without vomiting EXAM: MRI HEAD WITHOUT CONTRAST TECHNIQUE: Multiplanar, multiecho pulse sequences of the brain and surrounding structures were obtained without intravenous contrast. COMPARISON:  Noncontrast head CT 12/01/2019, noncontrast head CT 12/19/2017, brain MRI 08/26/2013, CT angiogram head 08/19/2013 FINDINGS: Brain: Multiple sequences are significantly motion degraded. Most notably there is moderate motion degradation of the axial and coronal diffusion-weighted sequence, moderate motion degradation of the axial SWI sequence, moderate motion degradation of the axial T1 weighted sequence, moderate/severe motion degradation of the axial T2 FLAIR sequence and moderate/severe motion degradation of the coronal T2 weighted sequence. Moderate to advanced patchy and confluent T2/FLAIR hyperintensity within the cerebral white matter is nonspecific, but consistent with chronic small vessel ischemic disease. Findings have progressed as compared to prior MRI 08/26/2013. Redemonstrated chronic lacunar infarcts within the bilateral basal ganglia. Also again seen, there are chronic small-vessel ischemic changes within the thalami and pons. Chronic microhemorrhages within the right frontal lobe, left periatrial region and left cerebellum not definitively present on a prior MRI. Stable, mild generalized parenchymal atrophy. There is no acute infarct. No evidence of intracranial mass. No extra-axial fluid collection. No midline shift. Vascular: Expected proximal arterial flow voids. 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 Skull and upper cervical spine: No focal marrow lesion Sinuses/Orbits: Visualized orbits show no acute finding. Mild ethmoid and right  maxillary sinus mucosal thickening. No significant mastoid effusion. IMPRESSION: 1. Motion degraded examination as described. 2. No evidence of acute intracranial abnormality, including acute infarction. 3. Moderate to advanced chronic small vessel ischemic disease has progressed as compared to prior MRI 08/26/2013. Redemonstrated chronic lacunar infarcts within the bilateral basal ganglia. 4. Nonspecific chronic microhemorrhages within the right frontal lobe, left periatrial region and left cerebellum which were not definitively present on the prior MRI. 5. Stable, mild generalized parenchymal atrophy. 6. 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 Electronically Signed   By: Kellie Simmering DO   On: 12/02/2019 08:26   US Carotid Bilateral (at [facility] and [facility] only)  Result Date: 12/02/2019 CLINICAL DATA:  84 year old female with TIA EXAM: BILATERAL CAROTID DUPLEX ULTRASOUND TECHNIQUE: Pearline Cables scale imaging, color Doppler and duplex ultrasound were performed of bilateral carotid and vertebral arteries in the neck. COMPARISON:  None. FINDINGS: Criteria: Quantification of carotid stenosis is based on velocity parameters that correlate the residual internal carotid diameter with NASCET-based stenosis levels, using the diameter  of the distal internal carotid lumen as the denominator for stenosis measurement. The following velocity measurements were obtained: RIGHT ICA:  Systolic 99991111 cm/sec, Diastolic 21 cm/sec CCA:  69 cm/sec SYSTOLIC ICA/CCA RATIO:  2.0 ECA:  120 cm/sec LEFT ICA:  Systolic 0000000 cm/sec, Diastolic 21 cm/sec CCA:  75 cm/sec SYSTOLIC ICA/CCA RATIO:  3.8 ECA:  1 4 cm/sec Right Brachial SBP: Not acquired Left Brachial SBP: Not acquired RIGHT CAROTID ARTERY: No significant calcifications of the right common carotid artery. Intermediate waveform maintained. Moderate heterogeneous and partially calcified plaque at the right carotid bifurcation. No  significant lumen shadowing. Low resistance waveform of the right ICA. Tortuosity RIGHT VERTEBRAL ARTERY: Antegrade flow with low resistance waveform. LEFT CAROTID ARTERY: No significant calcifications of the left common carotid artery. Intermediate waveform maintained. Moderate heterogeneous and partially calcified plaque at the left carotid bifurcation. No significant lumen shadowing. Low resistance waveform of the left ICA. Tortuosity LEFT VERTEBRAL ARTERY:  Antegrade flow with low resistance waveform. IMPRESSION: Right: Color duplex indicates moderate heterogeneous and calcified plaque, with no hemodynamically significant stenosis by duplex criteria in the extracranial cerebrovascular circulation. Left: Heterogeneous and partially calcified plaque at the left carotid bifurcation, with discordant results regarding degree of stenosis by established duplex criteria. Peak velocity suggests 70% - 99% stenosis, with the ICA/ CCA ratio suggesting a lesser degree of stenosis. If establishing a more accurate degree of stenosis is required, cerebral angiogram should be considered, or as a second best test, CTA. Signed, Dulcy Fanny. Dellia Nims, RPVI [facility] Radiology Electronically Signed   By: Corrie Mckusick D.O.   On: 12/02/2019 11:33   PERIPHERAL VASCULAR CATHETERIZATION  Result Date: 12/04/2019 See op note  DG Chest Port 1 View  Result Date: 12/21/2019 CLINICAL DATA:  History of COPD. Progressive dyspnea on exertion. EXAM: PORTABLE CHEST 1 VIEW COMPARISON:  Chest CT 12/19/2019 FINDINGS: Normal heart size. Tortuous calcified thoracic aorta. Chronic bronchitic changes. No pleural effusion or edema. Remote right posterior rib fractures. Both lungs are clear. The visualized skeletal structures are unremarkable. IMPRESSION: No acute cardiopulmonary abnormalities. Aortic Atherosclerosis (ICD10-I70.0). Electronically Signed   By: Kerby Moors M.D.   On: 12/21/2019  08:35   DG Chest Port 1 View  Result Date: 12/05/2019 CLINICAL DATA:  Shortness of breath. EXAM: PORTABLE CHEST 1 VIEW COMPARISON:  12/02/2019 FINDINGS: Heart size remains enlarged. Calcification of the thoracic aorta is similar to the prior study. No consolidation or sign of pleural effusion. Slight increase in density along the paraspinal region likely reflects hiatal hernia. Spinal degenerative changes. No acute or destructive bone process. IMPRESSION: 1. No active cardiopulmonary disease. 2. Stable cardiomegaly and aortic atherosclerosis. Electronically Signed   By: Zetta Bills M.D.   On: 12/05/2019 10:42   DG Chest Portable 1 View  Result Date: 12/02/2019 CLINICAL DATA:  Slurred speech x2 days. EXAM: PORTABLE CHEST 1 VIEW COMPARISON:  January 26, 2014 FINDINGS: Mild diffuse chronic appearing increased lung markings are seen. There is no evidence of acute infiltrate, pleural effusion or pneumothorax. The cardiac silhouette is moderately enlarged. There is marked severity calcification of the aortic arch. Multilevel degenerative changes seen throughout the thoracic spine. IMPRESSION: Chronic-appearing increased lung markings without evidence of acute or active cardiopulmonary disease. Electronically Signed   By: Virgina Norfolk M.D.   On: 12/02/2019 00:33   ECHOCARDIOGRAM COMPLETE  Result Date: 12/03/2019    ECHOCARDIOGRAM REPORT   Patient Name:   Toni White Date of Exam: 12/02/2019 Medical Rec #:  [id number]     Height:       61.0 in Accession #:    [id number]    Weight:       169.0 lb Date of Birth:  07/27/1936      BSA:          1.758 m Patient Age:    41 years      BP:           156/69 mmHg Patient Gender: F             HR:           70 bpm. Exam Location:  [facility] Procedure: 2D Echo, Cardiac Doppler and Color Doppler Indications:     TIA 435.9  History:         Patient has no prior history of Echocardiogram examinations.                  COPD; Risk Factors:Hypertension.  Sonographer:     Sherrie Sport RDCS (AE) Referring Phys:  [id number] Arvella Merles MANSY Diagnosing Phys: Yolonda Kida MD IMPRESSIONS  1. Left ventricular ejection fraction, by estimation, is 65 to 70%. The left ventricle has normal function. The left ventricle has no regional wall motion abnormalities. There is mild concentric left ventricular hypertrophy. Left ventricular diastolic parameters are consistent with Grade I diastolic dysfunction (impaired relaxation).  2. Right ventricular systolic function is moderately reduced. The right ventricular size is moderately enlarged. Mildly increased right ventricular wall thickness. There is normal pulmonary artery systolic pressure.  3. The mitral valve is normal in structure. No evidence of mitral valve regurgitation.  4. The aortic valve is grossly normal. Aortic valve regurgitation is not visualized. FINDINGS  Left Ventricle: Left ventricular ejection fraction, by estimation, is 65 to 70%. The left ventricle has normal function. The left ventricle has no regional wall motion abnormalities. The left ventricular internal cavity size was normal in size. There is  mild concentric left ventricular hypertrophy. Left ventricular diastolic parameters are consistent with Grade I diastolic dysfunction (impaired relaxation). Right Ventricle: The right ventricular size is moderately enlarged. Mildly increased right ventricular wall thickness. Right ventricular systolic function is moderately reduced. There is normal pulmonary artery systolic pressure. The tricuspid regurgitant velocity is 1.81 m/s, and with an assumed right atrial pressure of 10 mmHg, the estimated right ventricular systolic pressure is [id number] mmHg. Left Atrium: Left atrial size was normal in size. Right Atrium: Right atrial size was normal in size. Pericardium: Trivial pericardial effusion is present. Mitral Valve: The mitral valve is normal in structure. No evidence of mitral valve regurgitation. Tricuspid Valve: The tricuspid valve is normal  in structure. Tricuspid valve regurgitation is trivial. Aortic Valve: The aortic valve is grossly normal. Aortic valve regurgitation is not visualized. Aortic valve mean gradient measures 3.0 mmHg. Aortic valve peak gradient measures 5.5 mmHg. Aortic valve area, by VTI measures 3.64 cm. Pulmonic Valve: The pulmonic valve was grossly normal. Pulmonic valve regurgitation is not visualized. Aorta: The aortic root is normal in size and structure. IAS/Shunts: No atrial level shunt detected by color flow Doppler.  LEFT VENTRICLE PLAX 2D LVIDd:         3.50 cm  Diastology LVIDs:         2.14 cm  LV e' lateral:   6.42 cm/s LV PW:         0.91 cm  LV E/e' lateral: 16.2 LV IVS:        1.21 cm  LV  e' medial:    6.31 cm/s LVOT diam:     2.00 cm  LV E/e' medial:  16.5 LV SV:         82 LV SV Index:   47 LVOT Area:     3.14 cm  RIGHT VENTRICLE RV Basal diam:  2.26 cm LEFT ATRIUM             Index       RIGHT ATRIUM           Index LA diam:        3.40 cm 1.93 cm/m  RA Area:     11.70 cm LA Vol (A2C):   33.6 ml 19.11 ml/m RA Volume:   23.20 ml  13.19 ml/m LA Vol (A4C):   33.9 ml 19.28 ml/m LA Biplane Vol: 33.6 ml 19.11 ml/m  AORTIC VALVE                   PULMONIC VALVE AV Area (Vmax):    3.11 cm    PV Vmax:       0.70 m/s AV Area (Vmean):   3.72 cm    PV Peak grad:  2.0 mmHg AV Area (VTI):     3.64 cm AV Vmax:           117.00 cm/s AV Vmean:          79.400 cm/s AV VTI:            0.225 m AV Peak Grad:      5.5 mmHg AV Mean Grad:      3.0 mmHg LVOT Vmax:         116.00 cm/s LVOT Vmean:        93.900 cm/s LVOT VTI:          0.261 m LVOT/AV VTI ratio: 1.16  AORTA Ao Root diam: 3.10 cm MITRAL VALVE                TRICUSPID VALVE MV Area (PHT): 2.18 cm     TR Peak grad:   13.1 mmHg MV Decel Time: 348 msec     TR Vmax:        181.00 cm/s MV E velocity: 104.00 cm/s MV A velocity: 135.00 cm/s  SHUNTS MV E/A ratio:  0.77         Systemic VTI:  0.26 m                             Systemic Diam: 2.00 cm Dwayne D Callwood MD  Electronically signed by Yolonda Kida MD Signature Date/Time: 12/03/2019/6:39:32 AM    Final         ASSESSMENT/PLAN   Moderate Acute exacerbation of COPD  -on xopenex  -changed prednisone 20 po daily - 12/21/19  - d/cd Daliresp 52mcg daily-12/17/19 - continue zithromax 500mg   po daily- noted Roephin added due to encephalopathy and leukocytosis -appreciate collaboration  -MetaNEB q4h- increased frequency to QID - 12/20/19  -  Mucomyst 25ml 20% BID-Finished 12/18/19  - IS at bedside -continue PT/OT -reviewed CXR from today 12/21/19- worsening interstitial opacification with cephalization compared to yesterday, consistent with mild pulmonary edema will initiate lasix.  BNP today. RT to wean O2 with goal spO2  88-92 on 2L/min supplemental O2  -palliative care on case - comfort care with hospice - pulmonary will sign off     Acute on Chronic hypoxemic respiratory failure  - patient is  on 4L/min [place] and O2 saturation is >95% Suspect atelectasis will incrase recruitment maneuvers.   - she is on home settings at this time  - recommend PT/OT and early mobilization    Thank you for allowing me to participate in the care of this patient.   Patient/Family are satisfied with care plan and all questions have been answered.  This document was prepared using Dragon voice recognition software and may include unintentional dictation errors.     Ottie Glazier, M.D.  Division of [place]

## 2019-12-24 NOTE — Progress Notes (Signed)
Patient picked up by ems. Vital stable and patient has no complaints at this time. IV in place for hospice house. Report given to EMS. Patient off unit Care relinquished.

## 2019-12-24 NOTE — Discharge Summary (Signed)
Physician Discharge Summary  Patient ID: Toni White MRN: [id number] DOB/AGE: 09-30-1935 84 y.o.  Admit date: 12/16/2019 Discharge date: 12/24/2019  Admission Diagnoses:  Discharge Diagnoses:  Active Problems:   Chronic respiratory failure (HCC)   Near syncope   COPD exacerbation (HCC)   Acute metabolic encephalopathy   Weakness   Palliative care by specialist   Goals of care, counseling/discussion   Discharged Condition: poor  Hospital Course:   Toni White is a 84 y.o. F with hx COPD on home O2 2L, FEV1 22%, recent stroke, and HTN who presented with few days progressive DOE and then pre-syncope.  Patient was getting ready to get in the car, and slumped to the ground weak, nearly passed out.  In the ER, ECG and troponins normal (there was an initial spurious elevated troponin, repeats both normal).  CTA chest showed no pneumonia or PE.  Patient is diagnosed with acute on chronic hypoxemic restaurant failure and COPD exacerbation.  She is treated with steroids and Rocephin.  It became apparent that the patient condition is terminal with her end-stage COPD.  Patient has been seen by hospice, she is accepted to go to inpatient hospice.  Long-term prognosis very poor.  #1.  Acute on chronic hypoxemic restaurant failure. Oxygenation seem to be better today.  Patient back on 2 L oxygen.  2.  COPD exacerbation.  No significant bronchospasm today, continue oral steroid 10 mg daily for next few days.  3.  Community-acquired pneumonia at the right lower lobe. Continue cefdinir for the next 5 days.  4.  Acute metabolic encephalopathy. Mental status seem to be improving.  5.  Hyponatremia. Secondary to dehydration.  Condition had improved.  6.  Presyncope.  Secondary to dehydration.  7.  Failure to thrive.   Consults:  Palliative care.  Significant Diagnostic Studies:   Treatments: Antibiotics and steroids.  Discharge Exam: Blood pressure (!) 157/76, pulse 85,  temperature (!) 97.5 F (36.4 C), temperature source Oral, resp. rate 18, height 5\' 1"  (1.549 m), weight 70.7 kg, SpO2 98 %. General appearance: alert and cooperative Resp: Significant decreased breathing sounds with very little air movement. Cardio: regular rate and rhythm, S1, S2 normal, no murmur, click, rub or gallop GI: soft, non-tender; bowel sounds normal; no masses,  no organomegaly Extremities: 1+ edema   Alert, oriented to place and person.  Disposition: Discharge disposition: 82-Hospice/Medical Facility       Discharge Instructions    Diet - low sodium heart healthy   Complete by: As directed    Increase activity slowly   Complete by: As directed      Allergies as of 12/24/2019      Reactions   Flexeril  [cyclobenzaprine Hcl]    Confusion   Reglan [metoclopramide] Hives   Relafen [nabumetone] Hives   Risperdal  [risperidone]    Confusion Rash   Sulfa Antibiotics Hives   Tape Other (See Comments)   Per patient pulls her skin off.   Triamterene-hctz    Hypercalcemia   Hctz [hydrochlorothiazide] Palpitations      Medication List    STOP taking these medications   alendronate 70 MG tablet Commonly known as: FOSAMAX   colchicine 0.6 MG tablet   pravastatin 40 MG tablet Commonly known as: PRAVACHOL   PreserVision AREDS 2+Multi Vit Caps     TAKE these medications   amLODipine 5 MG tablet Commonly known as: NORVASC Take 1 tablet (5 mg total) by mouth daily.   aspirin 81 MG tablet Take  81 mg by mouth daily.   CALCIUM 500 PO Take 1 tablet by mouth 2 (two) times daily.   cefdinir 300 MG capsule Commonly known as: OMNICEF Take 1 capsule (300 mg total) by mouth 2 (two) times daily for 5 days.   clopidogrel 75 MG tablet Commonly known as: PLAVIX Take 1 tablet (75 mg total) by mouth daily.   Fluticasone-Salmeterol 250-50 MCG/DOSE Aepb Commonly known as: ADVAIR Inhale 1 puff into the lungs 2 (two) times daily.   furosemide 20 MG tablet Commonly  known as: LASIX TAKE 1 TABLET TWICE DAILY AS NEEDED  FOR  SWELLING What changed: See the new instructions.   gabapentin 100 MG capsule Commonly known as: NEURONTIN Take 1 capsule (100 mg total) by mouth 2 (two) times daily.   levothyroxine 25 MCG tablet Commonly known as: SYNTHROID TAKE 1 AND 1/2 TABLETS EVERY DAY BEFORE BREAKFAST   metoprolol succinate 50 MG 24 hr tablet Commonly known as: TOPROL-XL TAKE 1/2 TABLET EVERY DAY   pantoprazole 40 MG tablet Commonly known as: PROTONIX TAKE 1 TABLET EVERY DAY   predniSONE 10 MG tablet Commonly known as: DELTASONE Take 1 tablet (10 mg total) by mouth daily with breakfast for 7 days. Start taking on: Dec 25, 2019   roflumilast 500 MCG Tabs tablet Commonly known as: DALIRESP Take 500 mcg by mouth daily.   tiotropium 18 MCG inhalation capsule Commonly known as: SPIRIVA Place 18 mcg into inhaler and inhale daily.   traMADol 50 MG tablet Commonly known as: ULTRAM TAKE 1 TABLET BY MOUTH EVERY 8 HOURS   Ventolin HFA 108 (90 Base) MCG/ACT inhaler Generic drug: albuterol Inhale 2 puffs into the lungs. Every 4-6 hours as needed        Signed: Sharen Hones 12/24/2019, 1:54 PM

## 2019-12-24 NOTE — Progress Notes (Addendum)
[facility]) Hospital Liaison RN note  Pt has been approved to go to [facility] with transfer planned for today.   Please send signed and completed DNR with patient at discharge.  RN please call report to [phone].  Addendum: EMS called at 400 pm, pt is third in line for transport.   Thank you. Margaretmary Eddy, BSN, RN [facility] Liaison

## 2019-12-25 ENCOUNTER — Encounter (INDEPENDENT_AMBULATORY_CARE_PROVIDER_SITE_OTHER): Payer: Medicare HMO

## 2019-12-25 NOTE — Progress Notes (Deleted)
Established patient visit   Patient: Toni White   DOB: September 06, 1935   84 y.o. Female  MRN: [id number] Visit Date: 12/26/2019  Today's healthcare provider: Lelon Huh, MD   No chief complaint on file.  Subjective    HPI Follow up Hospitalization  Patient was admitted to [facility] on 12/16/19 and discharged on 12/24/19. She was treated for COPD, syncope and abnormal chest xray. Treatment for this included see notes in chart. Telephone follow up was done on; not done.  She reports {excellent/good/fair:19992} compliance with treatment. She reports this condition is {resolved/improved/worsened:23923}.   {Show patient history (optional):23778::" "}   Medications: Outpatient Medications Prior to Visit  Medication Sig  . albuterol (VENTOLIN HFA) 108 (90 BASE) MCG/ACT inhaler Inhale 2 puffs into the lungs. Every 4-6 hours as needed  . amLODipine (NORVASC) 5 MG tablet Take 1 tablet (5 mg total) by mouth daily.  Marland Kitchen aspirin 81 MG tablet Take 81 mg by mouth daily.  . Calcium-Magnesium-Vitamin D (CALCIUM 500 PO) Take 1 tablet by mouth 2 (two) times daily.  . cefdinir (OMNICEF) 300 MG capsule Take 1 capsule (300 mg total) by mouth 2 (two) times daily for 5 days.  . clopidogrel (PLAVIX) 75 MG tablet Take 1 tablet (75 mg total) by mouth daily.  . Fluticasone-Salmeterol (ADVAIR) 250-50 MCG/DOSE AEPB Inhale 1 puff into the lungs 2 (two) times daily.  . furosemide (LASIX) 20 MG tablet TAKE 1 TABLET TWICE DAILY AS NEEDED  FOR  SWELLING (Patient taking differently: Take 20 mg by mouth daily. )  . gabapentin (NEURONTIN) 100 MG capsule Take 1 capsule (100 mg total) by mouth 2 (two) times daily.  Marland Kitchen levothyroxine (SYNTHROID) 25 MCG tablet TAKE 1 AND 1/2 TABLETS EVERY DAY BEFORE BREAKFAST  . metoprolol succinate (TOPROL-XL) 50 MG 24 hr tablet TAKE 1/2 TABLET EVERY DAY  . pantoprazole (PROTONIX) 40 MG tablet TAKE 1 TABLET EVERY DAY  . predniSONE (DELTASONE) 10 MG tablet Take 1 tablet (10 mg total) by  mouth daily with breakfast for 7 days.  . roflumilast (DALIRESP) 500 MCG TABS tablet Take 500 mcg by mouth daily.  Marland Kitchen tiotropium (SPIRIVA) 18 MCG inhalation capsule Place 18 mcg into inhaler and inhale daily.  . traMADol (ULTRAM) 50 MG tablet TAKE 1 TABLET BY MOUTH EVERY 8 HOURS   No facility-administered medications prior to visit.    Review of Systems  {Heme  Chem  Endocrine  Serology  Results Review (optional):23779::" "}  Objective    There were no vitals taken for this visit. {Show previous vital signs (optional):23777::" "}  Physical Exam Vitals reviewed.  Constitutional:      Appearance: Normal appearance.  HENT:     Head: Normocephalic and atraumatic.     Right Ear: External ear normal.     Left Ear: External ear normal.  Eyes:     General: No scleral icterus.    Conjunctiva/sclera: Conjunctivae normal.  Cardiovascular:     Rate and Rhythm: Normal rate and regular rhythm.     Pulses: Normal pulses.     Heart sounds: Normal heart sounds.  Pulmonary:     Effort: Pulmonary effort is normal.     Breath sounds: Normal breath sounds.  Musculoskeletal:     Right lower leg: No edema.     Left lower leg: No edema.  Skin:    General: Skin is warm and dry.  Neurological:     General: No focal deficit present.     Mental Status: She  is alert and oriented to person, place, and time.  Psychiatric:        Mood and Affect: Mood normal.        Behavior: Behavior normal.        Thought Content: Thought content normal.        Judgment: Judgment normal.     ***  No results found for any visits on 12/26/19.  Assessment & Plan     ***  No follow-ups on file.      {provider attestation***:1}   Lelon Huh, MD  [facility] [phone] (phone) [phone] (fax)  [place]

## 2019-12-26 ENCOUNTER — Inpatient Hospital Stay: Payer: Medicare HMO | Admitting: Family Medicine

## 2019-12-31 ENCOUNTER — Ambulatory Visit: Payer: Medicare HMO

## 2020-02-05 DEATH — deceased
